# Patient Record
Sex: Male | Born: 1975 | Race: White | Hispanic: No | Marital: Single | Smoking: Former smoker
Health system: Southern US, Community
[De-identification: ages and names within clinical notes are randomized; demographics above are authoritative.]

## PROBLEM LIST (undated history)

## (undated) DIAGNOSIS — I2510 Atherosclerotic heart disease of native coronary artery without angina pectoris: Secondary | ICD-10-CM

## (undated) DIAGNOSIS — Z8674 Personal history of sudden cardiac arrest: Secondary | ICD-10-CM

## (undated) DIAGNOSIS — Z87891 Personal history of nicotine dependence: Secondary | ICD-10-CM

## (undated) DIAGNOSIS — Z8679 Personal history of other diseases of the circulatory system: Secondary | ICD-10-CM

## (undated) DIAGNOSIS — I509 Heart failure, unspecified: Secondary | ICD-10-CM

## (undated) DIAGNOSIS — E785 Hyperlipidemia, unspecified: Secondary | ICD-10-CM

## (undated) DIAGNOSIS — I5020 Unspecified systolic (congestive) heart failure: Secondary | ICD-10-CM

## (undated) DIAGNOSIS — I252 Old myocardial infarction: Secondary | ICD-10-CM

## (undated) HISTORY — DX: Personal history of nicotine dependence: Z87.891

## (undated) HISTORY — DX: Unspecified systolic (congestive) heart failure: I50.20

## (undated) HISTORY — DX: Personal history of other diseases of the circulatory system: Z86.79

## (undated) HISTORY — PX: CARDIAC CATHETERIZATION: SHX172

## (undated) HISTORY — DX: Old myocardial infarction: I25.2

## (undated) HISTORY — DX: Hyperlipidemia, unspecified: E78.5

## (undated) HISTORY — DX: Heart failure, unspecified: I50.9

## (undated) HISTORY — DX: Atherosclerotic heart disease of native coronary artery without angina pectoris: I25.10

## (undated) HISTORY — DX: Personal history of sudden cardiac arrest: Z86.74

---

## 2016-12-03 DIAGNOSIS — Z720 Tobacco use: Secondary | ICD-10-CM

## 2016-12-03 DIAGNOSIS — N529 Male erectile dysfunction, unspecified: Secondary | ICD-10-CM | POA: Insufficient documentation

## 2016-12-03 HISTORY — DX: Tobacco use: Z72.0

## 2019-08-13 DIAGNOSIS — J302 Other seasonal allergic rhinitis: Secondary | ICD-10-CM | POA: Insufficient documentation

## 2020-09-07 ENCOUNTER — Encounter
Admission: EM | Disposition: A | Discharge status: Home / Self Care | Payer: Self-pay | Source: Home / Self Care | Attending: Internal Medicine

## 2020-09-07 ENCOUNTER — Emergency Department
Admission: EM | Admit: 2020-09-07 | Discharge: 2020-09-07 | Discharge status: Absent without leave | Payer: No Typology Code available for payment source | Source: Home / Self Care

## 2020-09-07 ENCOUNTER — Inpatient Hospital Stay
Admission: EM | Admit: 2020-09-07 | Discharge: 2020-09-13 | DRG: 246 | Disposition: A | Discharge status: Home / Self Care | Payer: No Typology Code available for payment source | Attending: Internal Medicine | Admitting: Internal Medicine

## 2020-09-07 DIAGNOSIS — Z4659 Encounter for fitting and adjustment of other gastrointestinal appliance and device: Secondary | ICD-10-CM

## 2020-09-07 DIAGNOSIS — Z978 Presence of other specified devices: Secondary | ICD-10-CM

## 2020-09-07 DIAGNOSIS — I5021 Acute systolic (congestive) heart failure: Secondary | ICD-10-CM

## 2020-09-07 DIAGNOSIS — I469 Cardiac arrest, cause unspecified: Secondary | ICD-10-CM | POA: Diagnosis present

## 2020-09-07 DIAGNOSIS — I2109 ST elevation (STEMI) myocardial infarction involving other coronary artery of anterior wall: Secondary | ICD-10-CM | POA: Diagnosis present

## 2020-09-07 DIAGNOSIS — D649 Anemia, unspecified: Secondary | ICD-10-CM | POA: Diagnosis present

## 2020-09-07 DIAGNOSIS — R112 Nausea with vomiting, unspecified: Secondary | ICD-10-CM | POA: Diagnosis not present

## 2020-09-07 DIAGNOSIS — E785 Hyperlipidemia, unspecified: Secondary | ICD-10-CM | POA: Diagnosis present

## 2020-09-07 DIAGNOSIS — I462 Cardiac arrest due to underlying cardiac condition: Secondary | ICD-10-CM | POA: Diagnosis present

## 2020-09-07 DIAGNOSIS — J9600 Acute respiratory failure, unspecified whether with hypoxia or hypercapnia: Secondary | ICD-10-CM

## 2020-09-07 DIAGNOSIS — Z79899 Other long term (current) drug therapy: Secondary | ICD-10-CM

## 2020-09-07 DIAGNOSIS — I2510 Atherosclerotic heart disease of native coronary artery without angina pectoris: Secondary | ICD-10-CM

## 2020-09-07 DIAGNOSIS — E7800 Pure hypercholesterolemia, unspecified: Secondary | ICD-10-CM | POA: Diagnosis not present

## 2020-09-07 DIAGNOSIS — I2102 ST elevation (STEMI) myocardial infarction involving left anterior descending coronary artery: Secondary | ICD-10-CM

## 2020-09-07 DIAGNOSIS — Z20822 Contact with and (suspected) exposure to covid-19: Secondary | ICD-10-CM | POA: Diagnosis present

## 2020-09-07 DIAGNOSIS — F17200 Nicotine dependence, unspecified, uncomplicated: Secondary | ICD-10-CM | POA: Diagnosis present

## 2020-09-07 DIAGNOSIS — R570 Cardiogenic shock: Secondary | ICD-10-CM | POA: Diagnosis present

## 2020-09-07 DIAGNOSIS — Z720 Tobacco use: Secondary | ICD-10-CM | POA: Diagnosis not present

## 2020-09-07 DIAGNOSIS — I4901 Ventricular fibrillation: Secondary | ICD-10-CM | POA: Diagnosis present

## 2020-09-07 DIAGNOSIS — I5023 Acute on chronic systolic (congestive) heart failure: Secondary | ICD-10-CM | POA: Diagnosis present

## 2020-09-07 DIAGNOSIS — J690 Pneumonitis due to inhalation of food and vomit: Secondary | ICD-10-CM | POA: Diagnosis present

## 2020-09-07 DIAGNOSIS — Z452 Encounter for adjustment and management of vascular access device: Secondary | ICD-10-CM

## 2020-09-07 DIAGNOSIS — E876 Hypokalemia: Secondary | ICD-10-CM | POA: Diagnosis not present

## 2020-09-07 DIAGNOSIS — E875 Hyperkalemia: Secondary | ICD-10-CM | POA: Diagnosis present

## 2020-09-07 DIAGNOSIS — J9602 Acute respiratory failure with hypercapnia: Secondary | ICD-10-CM | POA: Diagnosis present

## 2020-09-07 DIAGNOSIS — I213 ST elevation (STEMI) myocardial infarction of unspecified site: Secondary | ICD-10-CM

## 2020-09-07 DIAGNOSIS — J9601 Acute respiratory failure with hypoxia: Secondary | ICD-10-CM | POA: Diagnosis not present

## 2020-09-07 DIAGNOSIS — R059 Cough, unspecified: Secondary | ICD-10-CM

## 2020-09-07 DIAGNOSIS — I272 Pulmonary hypertension, unspecified: Secondary | ICD-10-CM | POA: Diagnosis present

## 2020-09-07 DIAGNOSIS — N179 Acute kidney failure, unspecified: Secondary | ICD-10-CM

## 2020-09-07 DIAGNOSIS — I255 Ischemic cardiomyopathy: Secondary | ICD-10-CM | POA: Diagnosis present

## 2020-09-07 DIAGNOSIS — J9621 Acute and chronic respiratory failure with hypoxia: Secondary | ICD-10-CM | POA: Diagnosis not present

## 2020-09-07 DIAGNOSIS — N170 Acute kidney failure with tubular necrosis: Secondary | ICD-10-CM | POA: Diagnosis present

## 2020-09-07 HISTORY — PX: CORONARY/GRAFT ACUTE MI REVASCULARIZATION: CATH118305

## 2020-09-07 HISTORY — PX: RIGHT/LEFT HEART CATH AND CORONARY ANGIOGRAPHY: CATH118266

## 2020-09-07 LAB — COMPREHENSIVE METABOLIC PANEL
ALT: 130 U/L — ABNORMAL HIGH (ref 0–44)
AST: 150 U/L — ABNORMAL HIGH (ref 15–41)
Albumin: 3.7 g/dL (ref 3.5–5.0)
Alkaline Phosphatase: 90 U/L (ref 38–126)
Anion gap: 18 — ABNORMAL HIGH (ref 5–15)
BUN: 10 mg/dL (ref 6–20)
CO2: 15 mmol/L — ABNORMAL LOW (ref 22–32)
Calcium: 9 mg/dL (ref 8.9–10.3)
Chloride: 104 mmol/L (ref 98–111)
Creatinine, Ser: 1.43 mg/dL — ABNORMAL HIGH (ref 0.61–1.24)
GFR, Estimated: >60 mL/min (ref >60)
Glucose, Bld: 340 mg/dL — ABNORMAL HIGH (ref 70–99)
Potassium: 2.8 mmol/L — ABNORMAL LOW (ref 3.5–5.1)
Sodium: 137 mmol/L (ref 135–145)
Total Bilirubin: 0.8 mg/dL (ref 0.3–1.2)
Total Protein: 6.8 g/dL (ref 6.5–8.1)

## 2020-09-07 LAB — CBC WITH DIFFERENTIAL/PLATELET
Abs Immature Granulocytes: 0.65 10*3/uL — ABNORMAL HIGH (ref 0.00–0.07)
Basophils Absolute: 0.1 10*3/uL (ref 0.0–0.1)
Basophils Relative: 1 %
Eosinophils Absolute: 0.2 10*3/uL (ref 0.0–0.5)
Eosinophils Relative: 1 %
HCT: 41.3 % (ref 39.0–52.0)
Hemoglobin: 13.3 g/dL (ref 13.0–17.0)
Immature Granulocytes: 4 %
Lymphocytes Relative: 38 %
Lymphs Abs: 6.8 10*3/uL — ABNORMAL HIGH (ref 0.7–4.0)
MCH: 31 pg (ref 26.0–34.0)
MCHC: 32.2 g/dL (ref 30.0–36.0)
MCV: 96.3 fL (ref 80.0–100.0)
Monocytes Absolute: 1.1 10*3/uL — ABNORMAL HIGH (ref 0.1–1.0)
Monocytes Relative: 6 %
Neutro Abs: 8.9 10*3/uL — ABNORMAL HIGH (ref 1.7–7.7)
Neutrophils Relative %: 50 %
Platelets: 288 10*3/uL (ref 150–400)
RBC: 4.29 MIL/uL (ref 4.22–5.81)
RDW: 12.3 % (ref 11.5–15.5)
WBC: 17.8 10*3/uL — ABNORMAL HIGH (ref 4.0–10.5)
nRBC: 0 % (ref 0.0–0.2)

## 2020-09-07 LAB — GLUCOSE, CAPILLARY: Glucose-Capillary: 140 mg/dL — ABNORMAL HIGH (ref 70–99)

## 2020-09-07 LAB — BLOOD GAS, ARTERIAL
Acid-base deficit: 8.4 mmol/L — ABNORMAL HIGH (ref 0.0–2.0)
Bicarbonate: 19.6 mmol/L — ABNORMAL LOW (ref 20.0–28.0)
FIO2: 100
MECHVT: 500 mL
O2 Saturation: 98.4 %
PEEP: 5 cmH2O
Patient temperature: 37
RATE: 18 resp/min
pCO2 arterial: 49 mmHg — ABNORMAL HIGH (ref 32.0–48.0)
pH, Arterial: 7.21 — ABNORMAL LOW (ref 7.350–7.450)
pO2, Arterial: 133 mmHg — ABNORMAL HIGH (ref 83.0–108.0)

## 2020-09-07 LAB — POCT ACTIVATED CLOTTING TIME
Activated Clotting Time: 225 seconds
Activated Clotting Time: 225 seconds

## 2020-09-07 LAB — TROPONIN I (HIGH SENSITIVITY)
Troponin I (High Sensitivity): 3635 ng/L (ref <18)
Troponin I (High Sensitivity): >27000 ng/L (ref <18)

## 2020-09-07 LAB — MAGNESIUM: Magnesium: 2.2 mg/dL (ref 1.7–2.4)

## 2020-09-07 LAB — MRSA PCR SCREENING: MRSA by PCR: NEGATIVE

## 2020-09-07 LAB — RESP PANEL BY RT-PCR (FLU A&B, COVID) ARPGX2
Influenza A by PCR: NEGATIVE
Influenza B by PCR: NEGATIVE
SARS Coronavirus 2 by RT PCR: NEGATIVE

## 2020-09-07 LAB — HIV ANTIBODY (ROUTINE TESTING W REFLEX): HIV Screen 4th Generation wRfx: NONREACTIVE

## 2020-09-07 SURGERY — CORONARY/GRAFT ACUTE MI REVASCULARIZATION
Anesthesia: Moderate Sedation

## 2020-09-07 MED ORDER — HEPARIN (PORCINE) IN NACL 1000-0.9 UT/500ML-% IV SOLN
INTRAVENOUS | Status: AC
  Filled 2020-09-07: qty 1000

## 2020-09-07 MED ORDER — ATORVASTATIN CALCIUM 20 MG PO TABS
80.0000 mg | ORAL_TABLET | Freq: Every day | ORAL | Status: DC
  Administered 2020-09-07 – 2020-09-09 (×3): 80 mg
  Filled 2020-09-07 (×2): qty 4

## 2020-09-07 MED ORDER — ATORVASTATIN CALCIUM 20 MG PO TABS
80.0000 mg | ORAL_TABLET | Freq: Every day | ORAL | Status: DC
  Filled 2020-09-07: qty 4

## 2020-09-07 MED ORDER — ETOMIDATE 2 MG/ML IV SOLN
INTRAVENOUS | Status: DC | PRN
  Administered 2020-09-07: 20 mg via INTRAVENOUS

## 2020-09-07 MED ORDER — ASPIRIN 81 MG PO CHEW
81.0000 mg | CHEWABLE_TABLET | Freq: Every day | ORAL | Status: DC
  Administered 2020-09-08 – 2020-09-10 (×3): 81 mg
  Filled 2020-09-07 (×3): qty 1

## 2020-09-07 MED ORDER — POTASSIUM CHLORIDE 20 MEQ PO PACK
40.0000 meq | PACK | Freq: Once | ORAL | Status: AC
  Administered 2020-09-07: 40 meq
  Filled 2020-09-07: qty 2

## 2020-09-07 MED ORDER — TIROFIBAN HCL IV 12.5 MG/250 ML
0.1500 ug/kg/min | INTRAVENOUS | Status: AC
  Administered 2020-09-07: 0.15 ug/kg/min via INTRAVENOUS
  Filled 2020-09-07: qty 250

## 2020-09-07 MED ORDER — SODIUM CHLORIDE 0.9 % IV SOLN
250.0000 mL | INTRAVENOUS | Status: DC
  Administered 2020-09-07: 250 mL via INTRAVENOUS

## 2020-09-07 MED ORDER — DOCUSATE SODIUM 50 MG/5ML PO LIQD: 100.0000 mg | Freq: Two times a day (BID) | ORAL | Status: DC | PRN

## 2020-09-07 MED ORDER — ASPIRIN 81 MG PO CHEW: 81.0000 mg | CHEWABLE_TABLET | Freq: Every day | ORAL | Status: DC

## 2020-09-07 MED ORDER — SODIUM CHLORIDE 0.9% FLUSH
3.0000 mL | Freq: Two times a day (BID) | INTRAVENOUS | Status: DC
  Administered 2020-09-07 – 2020-09-12 (×11): 3 mL via INTRAVENOUS

## 2020-09-07 MED ORDER — MIDAZOLAM HCL 2 MG/2ML IJ SOLN
INTRAMUSCULAR | Status: AC
  Filled 2020-09-07: qty 2

## 2020-09-07 MED ORDER — ASPIRIN 300 MG RE SUPP: 300.0000 mg | Freq: Once | RECTAL | Status: DC

## 2020-09-07 MED ORDER — NOREPINEPHRINE 4 MG/250ML-% IV SOLN: 0.0000 ug/min | INTRAVENOUS | Status: DC

## 2020-09-07 MED ORDER — NOREPINEPHRINE BITARTRATE 1 MG/ML IV SOLN
INTRAVENOUS | Status: AC
  Filled 2020-09-07: qty 4

## 2020-09-07 MED ORDER — ACETAMINOPHEN 325 MG PO TABS
650.0000 mg | ORAL_TABLET | ORAL | Status: DC | PRN
Start: 2020-09-07 — End: 2020-09-07

## 2020-09-07 MED ORDER — MIDAZOLAM HCL 5 MG/5ML IJ SOLN
INTRAMUSCULAR | Status: DC | PRN
  Administered 2020-09-07: 4 mg via INTRAVENOUS

## 2020-09-07 MED ORDER — SODIUM CHLORIDE 0.9 % IV SOLN
250.0000 mL | INTRAVENOUS | Status: DC | PRN
Start: 2020-09-07 — End: 2020-09-13
  Administered 2020-09-11: 250 mL via INTRAVENOUS

## 2020-09-07 MED ORDER — HEPARIN (PORCINE) IN NACL 1000-0.9 UT/500ML-% IV SOLN
INTRAVENOUS | Status: DC | PRN
  Administered 2020-09-07: 1000 mL

## 2020-09-07 MED ORDER — DOCUSATE SODIUM 100 MG PO CAPS
100.0000 mg | ORAL_CAPSULE | Freq: Two times a day (BID) | ORAL | Status: DC | PRN
  Filled 2020-09-07: qty 1

## 2020-09-07 MED ORDER — VERAPAMIL HCL 2.5 MG/ML IV SOLN
INTRAVENOUS | Status: DC | PRN
  Administered 2020-09-07: 2.5 mg via INTRA_ARTERIAL

## 2020-09-07 MED ORDER — TIROFIBAN HCL IN NACL 5-0.9 MG/100ML-% IV SOLN
INTRAVENOUS | Status: AC
  Filled 2020-09-07: qty 100

## 2020-09-07 MED ORDER — CHLORHEXIDINE GLUCONATE CLOTH 2 % EX PADS
6.0000 | MEDICATED_PAD | Freq: Every day | CUTANEOUS | Status: DC
  Administered 2020-09-07 – 2020-09-12 (×5): 6 via TOPICAL

## 2020-09-07 MED ORDER — TIROFIBAN (AGGRASTAT) BOLUS VIA INFUSION
INTRAVENOUS | Status: DC | PRN
  Administered 2020-09-07: 2000 ug via INTRAVENOUS

## 2020-09-07 MED ORDER — PROPOFOL 1000 MG/100ML IV EMUL
5.0000 ug/kg/min | INTRAVENOUS | Status: DC
  Administered 2020-09-07: 5 ug/kg/min via INTRAVENOUS

## 2020-09-07 MED ORDER — TICAGRELOR 90 MG PO TABS: 90.0000 mg | ORAL_TABLET | Freq: Two times a day (BID) | ORAL | Status: DC

## 2020-09-07 MED ORDER — TIROFIBAN HCL IN NACL 5-0.9 MG/100ML-% IV SOLN
INTRAVENOUS | Status: AC | PRN
  Administered 2020-09-07: 0.15 ug/kg/min via INTRAVENOUS

## 2020-09-07 MED ORDER — FENTANYL CITRATE (PF) 100 MCG/2ML IJ SOLN
INTRAMUSCULAR | Status: AC
  Filled 2020-09-07: qty 2

## 2020-09-07 MED ORDER — SUCCINYLCHOLINE CHLORIDE 20 MG/ML IJ SOLN
INTRAMUSCULAR | Status: DC | PRN
  Administered 2020-09-07: 100 mg via INTRAVENOUS

## 2020-09-07 MED ORDER — ACETAMINOPHEN 325 MG PO TABS
650.0000 mg | ORAL_TABLET | ORAL | Status: DC | PRN
  Administered 2020-09-08: 650 mg
  Filled 2020-09-07: qty 2

## 2020-09-07 MED ORDER — CHLORHEXIDINE GLUCONATE 0.12% ORAL RINSE (MEDLINE KIT)
15.0000 mL | Freq: Two times a day (BID) | OROMUCOSAL | Status: DC
  Administered 2020-09-07 – 2020-09-12 (×9): 15 mL via OROMUCOSAL

## 2020-09-07 MED ORDER — ONDANSETRON HCL 4 MG/2ML IJ SOLN
4.0000 mg | Freq: Four times a day (QID) | INTRAMUSCULAR | Status: DC | PRN
  Administered 2020-09-10: 4 mg via INTRAVENOUS
  Filled 2020-09-07: qty 2

## 2020-09-07 MED ORDER — ONDANSETRON HCL 4 MG/2ML IJ SOLN
INTRAMUSCULAR | Status: AC
  Filled 2020-09-07: qty 2

## 2020-09-07 MED ORDER — ASPIRIN 300 MG RE SUPP
300.0000 mg | Freq: Once | RECTAL | Status: AC
  Administered 2020-09-07: 300 mg via RECTAL

## 2020-09-07 MED ORDER — ORAL CARE MOUTH RINSE
15.0000 mL | OROMUCOSAL | Status: DC
  Administered 2020-09-07 – 2020-09-10 (×26): 15 mL via OROMUCOSAL

## 2020-09-07 MED ORDER — TICAGRELOR 90 MG PO TABS
ORAL_TABLET | ORAL | Status: AC
  Filled 2020-09-07: qty 2

## 2020-09-07 MED ORDER — SODIUM BICARBONATE 8.4 % IV SOLN
INTRAVENOUS | Status: DC | PRN
  Administered 2020-09-07: 25 meq via INTRAVENOUS

## 2020-09-07 MED ORDER — PROPOFOL 1000 MG/100ML IV EMUL
INTRAVENOUS | Status: AC
  Filled 2020-09-07: qty 100

## 2020-09-07 MED ORDER — NOREPINEPHRINE 4 MG/250ML-% IV SOLN
2.0000 ug/min | INTRAVENOUS | Status: DC
  Administered 2020-09-07: 2 ug/min via INTRAVENOUS
  Administered 2020-09-08 (×2): 10 ug/min via INTRAVENOUS
  Filled 2020-09-07 (×3): qty 250

## 2020-09-07 MED ORDER — HEPARIN SODIUM (PORCINE) 1000 UNIT/ML IJ SOLN
INTRAMUSCULAR | Status: DC | PRN
  Administered 2020-09-07: 3000 [IU] via INTRAVENOUS
  Administered 2020-09-07: 5000 [IU] via INTRAVENOUS

## 2020-09-07 MED ORDER — SODIUM CHLORIDE 0.9 % IV SOLN: 250.0000 mL | INTRAVENOUS | Status: DC | PRN

## 2020-09-07 MED ORDER — IOHEXOL 300 MG/ML  SOLN
INTRAMUSCULAR | Status: DC | PRN
  Administered 2020-09-07: 137 mL

## 2020-09-07 MED ORDER — TICAGRELOR 90 MG PO TABS
ORAL_TABLET | ORAL | Status: DC | PRN
  Administered 2020-09-07: 180 mg via ORAL

## 2020-09-07 MED ORDER — FAMOTIDINE IN NACL 20-0.9 MG/50ML-% IV SOLN
20.0000 mg | INTRAVENOUS | Status: DC
  Administered 2020-09-07 – 2020-09-10 (×4): 20 mg via INTRAVENOUS
  Filled 2020-09-07 (×4): qty 50

## 2020-09-07 MED ORDER — HEPARIN SODIUM (PORCINE) 1000 UNIT/ML IJ SOLN
INTRAMUSCULAR | Status: AC
  Filled 2020-09-07: qty 1

## 2020-09-07 MED ORDER — VERAPAMIL HCL 2.5 MG/ML IV SOLN
INTRAVENOUS | Status: AC
  Filled 2020-09-07: qty 2

## 2020-09-07 MED ORDER — MIDAZOLAM 50MG/50ML (1MG/ML) PREMIX INFUSION
0.5000 mg/h | INTRAVENOUS | Status: DC
  Filled 2020-09-07: qty 50

## 2020-09-07 MED ORDER — ENOXAPARIN SODIUM 40 MG/0.4ML ~~LOC~~ SOLN: 40.0000 mg | SUBCUTANEOUS | Status: DC

## 2020-09-07 MED ORDER — MIDAZOLAM HCL 2 MG/2ML IJ SOLN
INTRAMUSCULAR | Status: DC | PRN
  Administered 2020-09-07: 1 mg via INTRAVENOUS

## 2020-09-07 MED ORDER — POLYETHYLENE GLYCOL 3350 17 G PO PACK
17.0000 g | PACK | Freq: Every day | ORAL | Status: DC | PRN
  Filled 2020-09-07: qty 1

## 2020-09-07 MED ORDER — PROPOFOL 1000 MG/100ML IV EMUL
5.0000 ug/kg/min | INTRAVENOUS | Status: DC
  Administered 2020-09-07: 20 ug/kg/min via INTRAVENOUS
  Administered 2020-09-07 – 2020-09-08 (×4): 30 ug/kg/min via INTRAVENOUS
  Administered 2020-09-09 (×4): 40 ug/kg/min via INTRAVENOUS
  Administered 2020-09-10 (×2): 30 ug/kg/min via INTRAVENOUS
  Filled 2020-09-07 (×10): qty 100

## 2020-09-07 MED ORDER — FENTANYL 2500MCG IN NS 250ML (10MCG/ML) PREMIX INFUSION
0.0000 ug/h | INTRAVENOUS | Status: DC
  Administered 2020-09-07: 100 ug/h via INTRAVENOUS
  Administered 2020-09-08 (×2): 200 ug/h via INTRAVENOUS
  Administered 2020-09-09: 75 ug/h via INTRAVENOUS
  Filled 2020-09-07 (×4): qty 250

## 2020-09-07 MED ORDER — SODIUM CHLORIDE 0.9% FLUSH: 3.0000 mL | INTRAVENOUS | Status: DC | PRN

## 2020-09-07 MED ORDER — LIDOCAINE HCL (PF) 1 % IJ SOLN
INTRAMUSCULAR | Status: DC | PRN
  Administered 2020-09-07: 22 mL

## 2020-09-07 MED ORDER — FENTANYL CITRATE (PF) 100 MCG/2ML IJ SOLN
INTRAMUSCULAR | Status: DC | PRN
  Administered 2020-09-07: 50 ug via INTRAVENOUS

## 2020-09-07 MED ORDER — TICAGRELOR 90 MG PO TABS
90.0000 mg | ORAL_TABLET | Freq: Two times a day (BID) | ORAL | Status: DC
  Administered 2020-09-08 – 2020-09-10 (×5): 90 mg
  Filled 2020-09-07 (×5): qty 1

## 2020-09-07 MED ORDER — SODIUM BICARBONATE 8.4 % IV SOLN
INTRAVENOUS | Status: AC
  Filled 2020-09-07: qty 50

## 2020-09-07 MED ORDER — SODIUM CHLORIDE 0.9% FLUSH
3.0000 mL | INTRAVENOUS | Status: DC | PRN
Start: 2020-09-07 — End: 2020-09-13

## 2020-09-07 MED ORDER — POTASSIUM CHLORIDE 10 MEQ/100ML IV SOLN
10.0000 meq | INTRAVENOUS | Status: AC
Start: 2020-09-07 — End: 2020-09-08
  Administered 2020-09-07 – 2020-09-08 (×6): 10 meq via INTRAVENOUS
  Filled 2020-09-07 (×6): qty 100

## 2020-09-07 MED ORDER — POLYETHYLENE GLYCOL 3350 17 G PO PACK: 17.0000 g | PACK | Freq: Every day | ORAL | Status: DC | PRN

## 2020-09-07 MED ORDER — FAMOTIDINE IN NACL 20-0.9 MG/50ML-% IV SOLN: 20.0000 mg | Freq: Two times a day (BID) | INTRAVENOUS | Status: DC

## 2020-09-07 MED ORDER — ONDANSETRON HCL 4 MG/2ML IJ SOLN
INTRAMUSCULAR | Status: DC | PRN
  Administered 2020-09-07: 4 mg via INTRAVENOUS

## 2020-09-07 SURGICAL SUPPLY — 23 items
BALLN TREK RX 2.5X12 (BALLOONS) ×2
BALLN ~~LOC~~ TREK RX 3.75X8 (BALLOONS) ×2
BALLOON TREK RX 2.5X12 (BALLOONS) ×1 IMPLANT
BALLOON ~~LOC~~ TREK RX 3.75X8 (BALLOONS) ×1 IMPLANT
CANNULA 5F STIFF (CANNULA) ×2 IMPLANT
CATH INFINITI 5FR JK (CATHETERS) ×2 IMPLANT
CATH LAUNCHER 6FR EBU3.5 (CATHETERS) ×2 IMPLANT
CATH SWAN GANZ 7F STRAIGHT (CATHETERS) ×2 IMPLANT
COVER EZ STRL 42X30 (DRAPES) ×2 IMPLANT
DEVICE CLOSURE MYNXGRIP 6/7F (Vascular Products) ×2 IMPLANT
DEVICE RAD TR BAND REGULAR (VASCULAR PRODUCTS) ×2 IMPLANT
DRAPE BRACHIAL (DRAPES) ×2 IMPLANT
GLIDESHEATH SLEND SS 6F .021 (SHEATH) ×2 IMPLANT
GUIDEWIRE INQWIRE 1.5J.035X260 (WIRE) ×1 IMPLANT
INQWIRE 1.5J .035X260CM (WIRE) ×2
KIT ENCORE 26 ADVANTAGE (KITS) ×2 IMPLANT
PACK CARDIAC CATH (CUSTOM PROCEDURE TRAY) ×2 IMPLANT
PROTECTION STATION PRESSURIZED (MISCELLANEOUS) ×2
SET ATX SIMPLICITY (MISCELLANEOUS) ×2 IMPLANT
SHEATH AVANTI 7FRX11 (SHEATH) ×2 IMPLANT
STATION PROTECTION PRESSURIZED (MISCELLANEOUS) ×1 IMPLANT
STENT RESOLUTE ONYX 3.5X12 (Permanent Stent) ×2 IMPLANT
WIRE RUNTHROUGH .014X180CM (WIRE) ×2 IMPLANT

## 2020-09-07 NOTE — Sedation Documentation (Addendum)
Pt transported to cath lab.  

## 2020-09-07 NOTE — Consult Note (Signed)
Cardiology Consultation:   Patient ID: Adam Carter MRN: [id number]; DOB: [birth year]  Admit date: 09/07/2020 Date of Consult: 09/07/2020  PCP:  No primary care provider on file.   [facility]  Cardiologist:  Adam Carter) Advanced Practice Provider:  No care team member to display Electrophysiologist:  None     Patient Profile:   Adam Carter is a 45 y.o. male with a hx of tobacco use who is being seen today for the evaluation of out of hospital cardiac arrest and anterior ST elevation myocardial infarction at the request of Dr. Marisa Severin.  History of Present Illness:   Adam Carter is a 45 year old male with no prior cardiac history.  History was later obtained from the family who reported prolonged history of tobacco use but no chronic medical conditions.  They report he has been dealing with cervical spine issues and had an injection done about a week ago.  His girlfriend reported intermittent chest pain over the weekend but he did not seek medical attention.  He went to work today and reported some mild chest discomfort initially but then after lunch he reported severe chest pain followed by collapse.  Coworkers witnessed to collapse and did CPR.  When EMS arrived, he was in ventricular fibrillation.  He received 2 shocks and a single dose of epinephrine and had ROSC.  Post arrest EKG showed evidence of anterior ST elevation myocardial infarction.  STEMI was activated from the field.  The patient was brought to the ED and his airway was secured with an ET tube.  He had an IV placed and was given 4000 units of unfractionated heparin.  I recommended proceeding with emergent cardiac catheterization.   No past medical history on file.     Home Medications:  Prior to Admission medications   Not on File    Inpatient Medications: Scheduled Meds: . sodium chloride flush  3 mL Intravenous Q12H   Continuous Infusions: . sodium chloride    . famotidine (PEPCID) IV     . fentaNYL infusion INTRAVENOUS 100 mcg/hr (09/07/20 1643)  . midazolam    . propofol (DIPRIVAN) infusion     PRN Meds: sodium chloride, acetaminophen, docusate sodium, etomidate, midazolam, ondansetron (ZOFRAN) IV, polyethylene glycol, sodium chloride flush, succinylcholine  Allergies:   Not on File  Social History:   Social History   Socioeconomic History  . Marital status: Not on file    Spouse name: Not on file  . Number of children: Not on file  . Years of education: Not on file  . Highest education level: Not on file  Occupational History  . Not on file  Tobacco Use  . Smoking status: Not on file  . Smokeless tobacco: Not on file  Substance and Sexual Activity  . Alcohol use: Not on file  . Drug use: Not on file  . Sexual activity: Not on file  Other Topics Concern  . Not on file  Social History Narrative  . Not on file   Social Determinants of Health   Financial Resource Strain: Not on file  Food Insecurity: Not on file  Transportation Needs: Not on file  Physical Activity: Not on file  Stress: Not on file  Social Connections: Not on file  Intimate Partner Violence: Not on file    Family History:   Could not obtain family history from the patient was intubated and sedated  ROS:  Not able to perform given that the patient is intubated and sedated  Physical Exam/Data:   Vitals:   09/07/20 1545 09/07/20 1546 09/07/20 1621 09/07/20 1804  BP:  (!) 123/97    Pulse:  (!) 108    Resp:  20    SpO2: 100% 100%  100%  Weight:   80 kg   Height:   5\' 7"  (1.702 m)    No intake or output data in the 24 hours ending 09/07/20 1820 Last 3 Weights 09/07/2020  Weight (lbs) 176 lb 5.9 oz  Weight (kg) 80 kg     Body mass index is 27.62 kg/m.  General: Intubated and sedated with vomit in his mouth. HEENT: normal Lymph: no adenopathy Neck: no JVD Endocrine:  No thryomegaly Vascular: No carotid bruits; FA pulses 2+ bilaterally without bruits  Cardiac:  normal S1,  S2; RRR; no murmur  Lungs:  clear to auscultation bilaterally, no wheezing, rhonchi or rales  Abd: soft, nontender, no hepatomegaly  Ext: no edema Musculoskeletal:  No deformities Skin: EMS reported that he was blue initially but his color improved in the ED. Neuro: Nonfocal. Psych: Not able to evaluate.  EKG:  The EKG was personally reviewed and demonstrates: Sinus tachycardia with anterior ST elevation myocardial infarction.   Relevant CV Studies:   Laboratory Data:  High Sensitivity Troponin:   Recent Labs  Lab 09/07/20 1541  TROPONINIHS 3,635*     Chemistry Recent Labs  Lab 09/07/20 1541  NA 137  K 2.8*  CL 104  CO2 15*  GLUCOSE 340*  BUN 10  CREATININE 1.43*  CALCIUM 9.0  GFRNONAA >60  ANIONGAP 18*    Recent Labs  Lab 09/07/20 1541  PROT 6.8  ALBUMIN 3.7  AST 150*  ALT 130*  ALKPHOS 90  BILITOT 0.8   Hematology Recent Labs  Lab 09/07/20 1541  WBC 17.8*  RBC 4.29  HGB 13.3  HCT 41.3  MCV 96.3  MCH 31.0  MCHC 32.2  RDW 12.3  PLT 288   BNPNo results for input(s): BNP, PROBNP in the last 168 hours.  DDimer No results for input(s): DDIMER in the last 168 hours.   Radiology/Studies:  CARDIAC CATHETERIZATION  Result Date: 09/07/2020  The left ventricular ejection fraction is 25-35% by visual estimate.  There is moderate to severe left ventricular systolic dysfunction.  LV end diastolic pressure is moderately elevated.  Ost LAD to Prox LAD lesion is 100% stenosed.  Post intervention, there is a 0% residual stenosis.  A drug-eluting stent was successfully placed using a STENT RESOLUTE ONYX 3.5X12.  Mid LAD lesion is 30% stenosed.  1.  Out of hospital cardiac arrest with anterior ST elevation myocardial infarction due to ostial occlusion of the LAD.  No other obstructive disease. 2.  Moderately severely reduced LV systolic function with an EF of 25 to 30% with severe anterior/apical hypokinesis. 3.  Successful angioplasty and drug-eluting stent  placement to the ostial LAD. 4.  Right heart catheterization was performed after PCI and showed mildly elevated filling pressures, mild pulmonary hypertension and normal cardiac output.  PA sat was 78.7%.  Fick cardiac output was 6.17 with a cardiac index of 3.5.  CPO was 1.4.  Based on these hemodynamic numbers, no hemodynamic support was utilized. Recommendations: Dual antiplatelet therapy for at least 1 year. High-dose atorvastatin. We will run Aggrastat infusion for 12 hours given uncertainty of oral absorption of antiplatelet medications. The patient had significant agitation and this will need to be managed.  He is currently on propofol drip and fentanyl drip.  I discussed  with Dr. Belia Heman. Recommend electrolyte replacement. Critical care time of 45 minutes managing abnormal hemodynamics, ventilator management and electrolytes.     Assessment and Plan:   1. Out of hospital cardiac arrest due to thrombotic occlusion of ostial LAD: Emergent cardiac catheterization was done via the right radial artery which showed thrombotic occlusion of the ostial LAD with no other obstructive disease.  I performed successful angioplasty and drug-eluting stent placement to the LAD.  Recommend dual antiplatelet therapy for at least 12 months.  Given uncertainty about GI absorption of his ticagrelor, I am going to run Aggrastat infusion for 12 hours.  No beta-blockers or ACE inhibitors yet due to soft blood pressure.  Right heart catheterization was not consistent with cardiogenic shock and thus mechanical support device was not utilized. 2. Acute systolic heart failure due to ischemic cardiomyopathy: EF is 25 to 30% by left ventricular angiography.  We will obtain an echocardiogram.  Monitor hemodynamics closely.  Once blood pressure tolerates, the patient will be started on carvedilol and an ACE inhibitor/ARB/Arni.  If blood pressure tolerates, spironolactone will be added later.  If the patient makes meaningful neurologic  recovery, he might require a wearable defibrillator depending on what his ejection fraction is by echo. 3. Acute respiratory failure due to the above.  Monitor volume status carefully.  His wedge pressure was only mildly elevated at 18 mmHg.  No need for diuresis as of yet but will have to monitor.  I discussed with Dr. Belia Heman.  The patient was agitated biting on the tube.  He was placed on propofol drip and this will have to be managed. 4. Electrolyte abnormalities, I requested pharmacy consultation to replace potassium. 5. Tobacco use: We will counsel about smoking cessation when appropriate. 6. Hyperlipidemia: I started high-dose atorvastatin.   Risk Assessment/Risk Scores:   TIMI Risk Score for ST  Elevation MI:   The patient's TIMI risk score is 5, which indicates a 12.4% risk of all cause mortality at 30 days.      For questions or updates, please contact [facility] Please consult [email or web address] for contact info under    Signed, Lorine Bears, MD  09/07/2020 6:20 PM

## 2020-09-07 NOTE — Code Documentation (Signed)
Dr. Kirke Corin, MD at bedside.

## 2020-09-07 NOTE — ED Provider Notes (Signed)
[facility] Emergency Department Provider Note ____________________________________________   Event Date/Time   First MD Initiated Contact with Patient 09/07/20 1552     (approximate)  I have reviewed the triage vital signs and the nursing notes.   HISTORY  Chief Complaint No chief complaint on file.  Level 5 caveat: History of present illness limited due to unresponsiveness  HPI Adam Carter is a 45 y.o. male with unknown PMH who presents after cardiac arrest.  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  He then said "oh shit" and his coworkers witnessed him collapse.  He was found to not have a pulse and CPR was initiated by bystanders.  When EMS arrived, the patient was found to be in ventricular fibrillation.  He received 2 shocks and a single dose of epinephrine and had ROSC.  EKG postarrest showed evidence of STEMI.  The patient is unable to give any history.  No past medical history on file.  There are no problems to display for this patient.    Prior to Admission medications   Not on File    Allergies Patient has no allergy information on record.  No family history on file.  Social History    Review of Systems Level 5 caveat: Unable to obtain review of systems due to unresponsiveness  ____________________________________________   PHYSICAL EXAM:  VITAL SIGNS: ED Triage Vitals [09/07/20 1546]  Enc Vitals Group     BP (!) 123/97     Pulse Rate (!) 108     Resp 20     Temp      Temp src      SpO2 100 %     Weight      Height      Head Circumference      Peak Flow      Pain Score      Pain Loc      Pain Edu?      Excl. in GC?     Constitutional: Unresponsive. Eyes: Conjunctivae are normal.  Head: Atraumatic. Nose: No congestion/rhinnorhea. Mouth/Throat: Mucous membranes are moist.  Vomitus present in oropharynx. Neck: Normal range of motion.  Cardiovascular: Tachycardic, regular rhythm. Grossly  normal heart sounds.  Good peripheral circulation. Respiratory: Lungs CTAB. Gastrointestinal: Soft and nontender. No distention.  Genitourinary: Normal external genitalia. Musculoskeletal: No lower extremity edema.  Extremities warm and well perfused.  Neurologic: Unable to assess; unresponsive Skin:  Skin is warm and dry. No rash noted. Psychiatric: Unable to assess.  ____________________________________________   LABS (all labs ordered are listed, but only abnormal results are displayed)  Labs Reviewed  CBC WITH DIFFERENTIAL/PLATELET - Abnormal; Notable for the following components:      Result Value   WBC 17.8 (*)    Neutro Abs 8.9 (*)    Lymphs Abs 6.8 (*)    Monocytes Absolute 1.1 (*)    Abs Immature Granulocytes 0.65 (*)    All other components within normal limits  BLOOD GAS, ARTERIAL - Abnormal; Notable for the following components:   pH, Arterial 7.21 (*)    pCO2 arterial 49 (*)    pO2, Arterial 133 (*)    Bicarbonate 19.6 (*)    Acid-base deficit 8.4 (*)    All other components within normal limits  RESP PANEL BY RT-PCR (FLU A&B, COVID) ARPGX2  COMPREHENSIVE METABOLIC PANEL  TROPONIN I (HIGH SENSITIVITY)   ____________________________________________  EKG   ____________________________________________  RADIOLOGY    ____________________________________________  PROCEDURES  Procedure(s) performed: Yes  Procedure Name: Intubation Date/Time: 09/07/2020 4:23 PM Performed by: Dionne Bucy, MD Pre-anesthesia Checklist: Patient identified, Emergency Drugs available, Suction available and Patient being monitored Preoxygenation: Pre-oxygenation with 100% oxygen Induction Type: IV induction and Rapid sequence Laryngoscope Size: Glidescope Tube size: 8.0 mm Number of attempts: 1 Placement Confirmation: ETT inserted through vocal cords under direct vision,  CO2 detector and Breath sounds checked- equal and bilateral Secured at: 26 cm Tube secured  with: ETT holder Dental Injury: Teeth and Oropharynx as per pre-operative assessment        Critical Care performed: Yes  CRITICAL CARE Performed by: Dionne Bucy   Total critical care time: 30 minutes  Critical care time was exclusive of separately billable procedures and treating other patients.  Critical care was necessary to treat or prevent imminent or life-threatening deterioration.  Critical care was time spent personally by me on the following activities: development of treatment plan with patient and/or surrogate as well as nursing, discussions with consultants, evaluation of patient's response to treatment, examination of patient, obtaining history from patient or surrogate, ordering and performing treatments and interventions, ordering and review of laboratory studies, ordering and review of radiographic studies, pulse oximetry and re-evaluation of patient's condition. ____________________________________________   INITIAL IMPRESSION / ASSESSMENT AND PLAN / ED COURSE  Pertinent labs & imaging results that were available during my care of the patient were reviewed by me and considered in my medical decision making (see chart for details).  45 year old male with unknown PMH presents after cardiac arrest.  Per EMS, he was witnessed to collapse by coworkers and CPR was started in the field.  He was found to be in ventricular fibrillation and successfully defibrillated with ROSC before ED arrival.  EKG showed anterior and lateral ST elevations.  I attempted to review the patient's chart in Epic but he has no prior visits or admissions here.  Code STEMI was activated in the field.  On arrival to the ED, the patient had a pulse and a King airway was in place.  Dr. Kirke Corin from cardiology was at the bedside.  The patient was moved to the ED stretcher, placed on the defibrillator and monitor.  He was intubated successfully on the first attempt and given a Versed bolus for  sedation afterwards.  Vital signs were stable.  Dr. Kirke Corin recommended emergent cardiac catheterization and the patient was taken up to the Cath Lab immediately.  ___________________________  [name] was evaluated in Emergency Department on 09/07/2020 for the symptoms described in the history of present illness. He was evaluated in the context of the global COVID-19 pandemic, which necessitated consideration that the patient might be at risk for infection with the SARS-CoV-2 virus that causes COVID-19. Institutional protocols and algorithms that pertain to the evaluation of patients at risk for COVID-19 are in a state of rapid change based on information released by regulatory bodies including the CDC and federal and state organizations. These policies and algorithms were followed during the patient's care in the ED.  ____________________________________________   FINAL CLINICAL IMPRESSION(S) / ED DIAGNOSES  Final diagnoses:  Cardiac arrest (HCC)  ST elevation myocardial infarction (STEMI), unspecified artery (HCC)      NEW MEDICATIONS STARTED DURING THIS VISIT:  There are no discharge medications for this patient.    Note:  This document was prepared using Dragon voice recognition software and may include unintentional dictation errors.   Dionne Bucy, MD 09/07/20 [phone]

## 2020-09-07 NOTE — Plan of Care (Addendum)
Report received from Clydia Llano, RN. Patient admitted S/P STEMI w/ PCI to ICU: intubated on sedation.

## 2020-09-07 NOTE — Code Documentation (Addendum)
Pt arrived EMS from work. Per EMS, pt had CP all of a sudden. Pt fell out on the floor and coworkers started CPR on him. EMS states initial rhythm was v fib. 1 EPI given. EMS shocked twice. Pulses present on arrival. [place] airway,

## 2020-09-07 NOTE — Consult Note (Signed)
PHARMACY CONSULT NOTE  Pharmacy Consult for Electrolyte Monitoring and Replacement   Recent Labs: Potassium (mmol/L)  Date Value  09/07/2020 2.8 (L)   Calcium (mg/dL)  Date Value  76/28/3151 9.0   Albumin (g/dL)  Date Value  76/16/0737 3.7   Sodium (mmol/L)  Date Value  09/07/2020 137    Assessment: Patient is a 45 y/o M with unknown PMH who presented to the ED after outside of hospital Vfib cardiac arrest secondary to STEMI. ACLS was initiated with ROSC. Patient went for emergent cardiac catheterization with successful angioplasty and DES placed to ostial LAD which was completely occluded. Patient is intubated, sedated, and on mechanical ventilation in the ICU. Pharmacy has been consulted to assist with electrolyte monitoring and replacement as indicated.  Goal of Therapy:  K > 4 Mg > 2 All other electrolytes within normal limits  Plan:  3/14 admission labs: K 2.8, Mg 2.2, Scr 1.43 --KCl 40 mEq per tube x 1 plus KCl 10 mEq IV x 6 --Re-check electrolytes with AM labs  Tressie Ellis 09/07/2020 7:17 PM

## 2020-09-07 NOTE — Sedation Documentation (Signed)
Pt arrived EMS from work. Per EMS, pt had CP all of a sudden. Pt fell out on the floor and coworkers started CPR on him. EMS states initial rhythm was v fib. 1 EPI given. EMS shocked twice. Pulses present on arrival. [place] airway.

## 2020-09-07 NOTE — H&P (Addendum)
NAME:  Esaias Cleavenger, MRN:  [id number], DOB:  13-Aug-1975, LOS: 0 ADMISSION DATE:  09/07/2020, CONSULTATION DATE:09/07/2020  REFERRING MD: Raliegh Ip  CHIEF COMPLAINT:  Cardiac arrest  Brief History:  45 yo male with acute and massive MI cardiac arrest and acute and severe hypoxic resp failure, due to severe ischemic cardiomyopathy and severe systolic CHF  History of Present Illness:  45 y.o. male with unknown PMH who presents after cardiac arrest.   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   He then said "oh shit" and his coworkers witnessed him collapse.   He was found to not have a pulse and CPR was initiated by bystanders.   When EMS arrived, the patient was found to be in ventricular fibrillation.   He received 2 shocks and a single dose of epinephrine and had ROSC.  EKG postarrest showed evidence of STEMI.    Patient had king airway placed and replaced with ETT CODE STEMI called and patient taken to cath lab  Admitted to ICU for cardiac arrest/resp failure  Past Medical History:   Active Ambulatory Problems    Diagnosis Date Noted  . No Active Ambulatory Problems   Resolved Ambulatory Problems    Diagnosis Date Noted  . No Resolved Ambulatory Problems   No Additional Past Medical History     Significant Hospital Events:  3/14 acute MI s/p cardiac arrest s/p CATH  Consults:  CARDIOLOGY  PCCM  Procedures:  3/14 CATH-left ventricular ejection fraction is 25-35% by visual estimate.  There is moderate to severe left ventricular systolic dysfunction.  LV end diastolic pressure is moderately elevated.  Ost LAD to Prox LAD lesion is 100% stenosed.  Post intervention, there is a 0% residual stenosis.  A drug-eluting stent was successfully placed using a STENT RESOLUTE ONYX 3.5X12.  Mid LAD lesion is 30% stenosed.  Micro Data:  VIRAL PANEL NEG COVID NEG     Objective   Blood pressure (!) 123/97, pulse (!) 108, resp. rate 20, height _0   (1.702 m), weight 80 kg, SpO2 100 %.    Vent Mode: AC FiO2 (%):  [100 %] 100 % Set Rate:  [18 bmp] 18 bmp Vt Set:  [500 mL] 500 mL PEEP:  [5 cmH20] 5 cmH20  No intake or output data in the 24 hours ending 09/07/20 1804 Filed Weights   09/07/20 1621  Weight: 80 kg    REVIEW OF SYSTEMS  PATIENT IS UNABLE TO PROVIDE COMPLETE REVIEW OF SYSTEM S DUE TO SEVERE CRITICAL ILLNESS AND ENCEPHALOPATHY   PHYSICAL EXAMINATION:  GENERAL:critically ill appearing, +resp distress HEAD: Normocephalic, atraumatic.  EYES: Pupils equal, round, reactive to light.  No scleral icterus.  MOUTH: Moist mucosal membrane. NECK: Supple. No thyromegaly. No nodules. No JVD.  PULMONARY: +rhonchi, +wheezing CARDIOVASCULAR: S1 and S2. Regular rate and rhythm. No murmurs, rubs, or gallops.  GASTROINTESTINAL: Soft, nontender, -distended. Positive bowel sounds.  MUSCULOSKELETAL: No swelling, clubbing, or edema.  NEUROLOGIC: obtunded SKIN:intact,warm,dry     Assessment & Plan:  45 yo male with acute and massive MI cardiac arrest and acute and severe hypoxic resp failure, due to severe ischemic cardiomyopathy and severe systolic CHF  At this time, patient had immediate CPR and lasted 5-7 mins based on report and was very agitated and trying to pull out ETT, patient was given propofol for sedation, at this time, Hypothermia Protocol may NOT be beneficial in this case   Severe ACUTE Hypoxic and Hypercapnic Respiratory Failure -  continue Mechanical Ventilator support -continue Bronchodilator Therapy -Wean Fio2 and PEEP as tolerated -VAP/VENT bundle implementation -will perform SAT/SBT when respiratory parameters are met   ACUTE SYSTOLIC CARDIAC FAILURE- EF 25% ACUTE MI -oxygen as needed -Lasix as tolerated -follow up cardiac enzymes as indicated -follow up cardiology recs  ACUTE KIDNEY INJURY/Renal Failure -continue Foley Catheter-assess need -Avoid nephrotoxic agents -Follow urine output, BMP -Ensure  adequate renal perfusion, optimize oxygenation -Renal dose medications   GI GI PROPHYLAXIS as indicated  NUTRITIONAL STATUS DIET-->NPO Constipation protocol as indicated  ENDO - ICU hypoglycemic\Hyperglycemia protocol -check FSBS per protocol  ACUTE ANEMIA- TRANSFUSE AS NEEDED DVT PRX with TED/SCD's ONLY TRANSFUSE if HGB<7     DVT/GI PRX ordered and assessed TRANSFUSIONS AS NEEDED MONITOR FSBS I Assessed the need for Labs I Assessed the need for Foley I Assessed the need for Central Venous Line Family Discussion when available I Assessed the need for Mobilization I made an Assessment of medications to be adjusted accordingly Safety Risk assessment completed  CASE DISCUSSED IN MULTIDISCIPLINARY ROUNDS WITH ICU TEAM   Best practice (evaluated daily)  Diet: NPO Pain/Anxiety/Delirium protocol (if indicated): ordered VAP protocol (if indicated): ordered DVT prophylaxis:Aggrastat drip GI prophylaxis: pepcid Mobility:bedrest Disposition:ICU   Labs   CBC: Recent Labs  Lab 09/07/20 1541  WBC 17.8*  NEUTROABS 8.9*  HGB 13.3  HCT 41.3  MCV 96.3  PLT 062    Basic Metabolic Panel: Recent Labs  Lab 09/07/20 1541  NA 137  K 2.8*  CL 104  CO2 15*  GLUCOSE 340*  BUN 10  CREATININE 1.43*  CALCIUM 9.0   GFR: Estimated Creatinine Clearance: 67.5 mL/min (A) (by C-G formula based on SCr of 1.43 mg/dL (H)). Recent Labs  Lab 09/07/20 1541  WBC 17.8*    Liver Function Tests: Recent Labs  Lab 09/07/20 1541  AST 150*  ALT 130*  ALKPHOS 90  BILITOT 0.8  PROT 6.8  ALBUMIN 3.7   No results for input(s): LIPASE, AMYLASE in the last 168 hours. No results for input(s): AMMONIA in the last 168 hours.  ABG    Component Value Date/Time   PHART 7.21 (L) 09/07/2020 1612   PCO2ART 49 (H) 09/07/2020 1612   PO2ART 133 (H) 09/07/2020 1612   HCO3 19.6 (L) 09/07/2020 1612   ACIDBASEDEF 8.4 (H) 09/07/2020 1612   O2SAT 98.4 09/07/2020 1612     Coagulation  Profile: No results for input(s): INR, PROTIME in the last 168 hours.  Cardiac Enzymes: No results for input(s): CKTOTAL, CKMB, CKMBINDEX, TROPONINI in the last 168 hours.  HbA1C: No results found for: HGBA1C  CBG: No results for input(s): GLUCAP in the last 168 hours.   Past Medical History:  He,  has no past medical history on file.    Social History:    +smoker  Family History:  His family history is not on file.   Allergies Not on File   Home Medications  Prior to Admission medications   Not on File      Critical Care Time devoted to patient care services described in this note is 65  minutes.   Overall, patient is critically ill, prognosis is guarded.  Patient with Multiorgan failure and at high risk for cardiac arrest and death.    Corrin Parker, M.D.  [facility]  Medical Director [place] Director [facility] Cardio-Pulmonary Department

## 2020-09-07 NOTE — Consult Note (Signed)
MEDICATION RELATED CONSULT NOTE  Pharmacy Consult for Aggrastat x 12h Indication: STEMI  Patient Measurements: Height: 5\' 7"  (170.2 cm) Weight: 80 kg (176 lb 5.9 oz) IBW/kg (Calculated) : 66.1  Labs: Recent Labs    09/07/20 1541  WBC 17.8*  HGB 13.3  HCT 41.3  PLT 288  CREATININE 1.43*  ALBUMIN 3.7  PROT 6.8  AST 150*  ALT 130*  ALKPHOS 90  BILITOT 0.8   Estimated Creatinine Clearance: 67.5 mL/min (A) (by C-G formula based on SCr of 1.43 mg/dL (H)).  Plan:  Aggrastat 0.15 mcg/kg/min x 12h per consult instructions (until 3/15 at 0600)  4/15 09/07/2020,6:54 PM

## 2020-09-07 NOTE — Sedation Documentation (Signed)
Dr. Arida, MD cardiology at bedside.  

## 2020-09-08 ENCOUNTER — Inpatient Hospital Stay: Payer: No Typology Code available for payment source

## 2020-09-08 ENCOUNTER — Inpatient Hospital Stay (HOSPITAL_COMMUNITY)
Admit: 2020-09-08 | Discharge: 2020-09-08 | Disposition: A | Discharge status: Home / Self Care | Payer: No Typology Code available for payment source | Attending: Cardiovascular Disease | Admitting: Cardiovascular Disease

## 2020-09-08 ENCOUNTER — Other Ambulatory Visit: Payer: Self-pay

## 2020-09-08 ENCOUNTER — Encounter: Payer: Self-pay | Admitting: Cardiovascular Disease

## 2020-09-08 DIAGNOSIS — I2102 ST elevation (STEMI) myocardial infarction involving left anterior descending coronary artery: Secondary | ICD-10-CM

## 2020-09-08 DIAGNOSIS — I469 Cardiac arrest, cause unspecified: Secondary | ICD-10-CM

## 2020-09-08 DIAGNOSIS — I213 ST elevation (STEMI) myocardial infarction of unspecified site: Secondary | ICD-10-CM

## 2020-09-08 LAB — HEPATIC FUNCTION PANEL
ALT: 155 U/L — ABNORMAL HIGH (ref 0–44)
AST: 291 U/L — ABNORMAL HIGH (ref 15–41)
Albumin: 3.5 g/dL (ref 3.5–5.0)
Alkaline Phosphatase: 87 U/L (ref 38–126)
Bilirubin, Direct: 0.2 mg/dL (ref 0.0–0.2)
Indirect Bilirubin: 0.7 mg/dL (ref 0.3–0.9)
Total Bilirubin: 0.9 mg/dL (ref 0.3–1.2)
Total Protein: 6.4 g/dL — ABNORMAL LOW (ref 6.5–8.1)

## 2020-09-08 LAB — CBC WITH DIFFERENTIAL/PLATELET
Abs Immature Granulocytes: 0.05 10*3/uL (ref 0.00–0.07)
Basophils Absolute: 0.1 10*3/uL (ref 0.0–0.1)
Basophils Relative: 1 %
Eosinophils Absolute: 0.1 10*3/uL (ref 0.0–0.5)
Eosinophils Relative: 1 %
HCT: 36 % — ABNORMAL LOW (ref 39.0–52.0)
Hemoglobin: 12.1 g/dL — ABNORMAL LOW (ref 13.0–17.0)
Immature Granulocytes: 0 %
Lymphocytes Relative: 12 %
Lymphs Abs: 1.7 10*3/uL (ref 0.7–4.0)
MCH: 31.7 pg (ref 26.0–34.0)
MCHC: 33.6 g/dL (ref 30.0–36.0)
MCV: 94.2 fL (ref 80.0–100.0)
Monocytes Absolute: 1 10*3/uL (ref 0.1–1.0)
Monocytes Relative: 7 %
Neutro Abs: 11.7 10*3/uL — ABNORMAL HIGH (ref 1.7–7.7)
Neutrophils Relative %: 79 %
Platelets: 273 10*3/uL (ref 150–400)
RBC: 3.82 MIL/uL — ABNORMAL LOW (ref 4.22–5.81)
RDW: 12.9 % (ref 11.5–15.5)
WBC: 14.6 10*3/uL — ABNORMAL HIGH (ref 4.0–10.5)
nRBC: 0 % (ref 0.0–0.2)

## 2020-09-08 LAB — ECHOCARDIOGRAM COMPLETE
AR max vel: 2.82 cm2
AV Area VTI: 3 cm2
AV Area mean vel: 2.57 cm2
AV Mean grad: 2 mmHg
AV Peak grad: 3.7 mmHg
Ao pk vel: 0.96 m/s
Area-P 1/2: 2.92 cm2
Calc EF: 34 %
Height: 67 in
S' Lateral: 3.18 cm
Single Plane A2C EF: 41.9 %
Single Plane A4C EF: 30.9 %
Weight: 3079.39 oz

## 2020-09-08 LAB — URINALYSIS, ROUTINE W REFLEX MICROSCOPIC
Bilirubin Urine: NEGATIVE
Glucose, UA: NEGATIVE mg/dL
Ketones, ur: 5 mg/dL — AB
Leukocytes,Ua: NEGATIVE
Nitrite: NEGATIVE
Protein, ur: 30 mg/dL — AB
Specific Gravity, Urine: 1.034 — ABNORMAL HIGH (ref 1.005–1.030)
Squamous Epithelial / HPF: NONE SEEN (ref 0–5)
pH: 5 (ref 5.0–8.0)

## 2020-09-08 LAB — BASIC METABOLIC PANEL
Anion gap: 5 (ref 5–15)
BUN: 18 mg/dL (ref 6–20)
CO2: 23 mmol/L (ref 22–32)
Calcium: 8.1 mg/dL — ABNORMAL LOW (ref 8.9–10.3)
Chloride: 109 mmol/L (ref 98–111)
Creatinine, Ser: 1.25 mg/dL — ABNORMAL HIGH (ref 0.61–1.24)
GFR, Estimated: >60 mL/min (ref >60)
Glucose, Bld: 138 mg/dL — ABNORMAL HIGH (ref 70–99)
Potassium: 5.7 mmol/L — ABNORMAL HIGH (ref 3.5–5.1)
Sodium: 137 mmol/L (ref 135–145)

## 2020-09-08 LAB — CBC
HCT: 41.4 % (ref 39.0–52.0)
Hemoglobin: 13.9 g/dL (ref 13.0–17.0)
MCH: 31.3 pg (ref 26.0–34.0)
MCHC: 33.6 g/dL (ref 30.0–36.0)
MCV: 93.2 fL (ref 80.0–100.0)
Platelets: 308 10*3/uL (ref 150–400)
RBC: 4.44 MIL/uL (ref 4.22–5.81)
RDW: 12.7 % (ref 11.5–15.5)
WBC: 21.6 10*3/uL — ABNORMAL HIGH (ref 4.0–10.5)
nRBC: 0 % (ref 0.0–0.2)

## 2020-09-08 LAB — PROTEIN / CREATININE RATIO, URINE
Creatinine, Urine: 207 mg/dL
Protein Creatinine Ratio: 0.24 mg/mg{Cre} — ABNORMAL HIGH (ref 0.00–0.15)
Total Protein, Urine: 49 mg/dL

## 2020-09-08 LAB — HEMOGLOBIN A1C
Hgb A1c MFr Bld: 6 % — ABNORMAL HIGH (ref 4.8–5.6)
Mean Plasma Glucose: 125.5 mg/dL

## 2020-09-08 LAB — URINE DRUG SCREEN, QUALITATIVE (ARMC ONLY)
Amphetamines, Ur Screen: POSITIVE — AB
Barbiturates, Ur Screen: NOT DETECTED
Benzodiazepine, Ur Scrn: POSITIVE — AB
Cannabinoid 50 Ng, Ur ~~LOC~~: NOT DETECTED
Cocaine Metabolite,Ur ~~LOC~~: NOT DETECTED
MDMA (Ecstasy)Ur Screen: NOT DETECTED
Methadone Scn, Ur: NOT DETECTED
Opiate, Ur Screen: NOT DETECTED
Phencyclidine (PCP) Ur S: NOT DETECTED
Tricyclic, Ur Screen: NOT DETECTED

## 2020-09-08 LAB — PROTIME-INR
INR: 1.2 (ref 0.8–1.2)
Prothrombin Time: 15.2 seconds (ref 11.4–15.2)

## 2020-09-08 LAB — GLUCOSE, CAPILLARY
Glucose-Capillary: 143 mg/dL — ABNORMAL HIGH (ref 70–99)
Glucose-Capillary: 159 mg/dL — ABNORMAL HIGH (ref 70–99)
Glucose-Capillary: 161 mg/dL — ABNORMAL HIGH (ref 70–99)
Glucose-Capillary: 171 mg/dL — ABNORMAL HIGH (ref 70–99)

## 2020-09-08 LAB — TRIGLYCERIDES: Triglycerides: 133 mg/dL (ref <150)

## 2020-09-08 LAB — PROCALCITONIN
Procalcitonin: 1.62 ng/mL
Procalcitonin: 1.37 ng/mL

## 2020-09-08 LAB — LACTIC ACID, PLASMA
Lactic Acid, Venous: 1.6 mmol/L (ref 0.5–1.9)
Lactic Acid, Venous: 1.3 mmol/L (ref 0.5–1.9)

## 2020-09-08 LAB — POTASSIUM
Potassium: 6.1 mmol/L — ABNORMAL HIGH (ref 3.5–5.1)
Potassium: 4.5 mmol/L (ref 3.5–5.1)

## 2020-09-08 LAB — MAGNESIUM: Magnesium: 1.7 mg/dL (ref 1.7–2.4)

## 2020-09-08 LAB — APTT: aPTT: 34 seconds (ref 24–36)

## 2020-09-08 LAB — FIBRINOGEN: Fibrinogen: 518 mg/dL — ABNORMAL HIGH (ref 210–475)

## 2020-09-08 LAB — TROPONIN I (HIGH SENSITIVITY): Troponin I (High Sensitivity): >27000 ng/L (ref <18)

## 2020-09-08 LAB — PHOSPHORUS: Phosphorus: 2.6 mg/dL (ref 2.5–4.6)

## 2020-09-08 MED ORDER — AMPICILLIN-SULBACTAM SODIUM 3 (2-1) G IJ SOLR
3.0000 g | Freq: Four times a day (QID) | INTRAMUSCULAR | Status: DC
  Administered 2020-09-08 – 2020-09-12 (×16): 3 g via INTRAVENOUS
  Filled 2020-09-08 (×5): qty 3
  Filled 2020-09-08: qty 8
  Filled 2020-09-08 (×2): qty 3
  Filled 2020-09-08: qty 8
  Filled 2020-09-08 (×5): qty 3
  Filled 2020-09-08 (×4): qty 8
  Filled 2020-09-08 (×2): qty 3

## 2020-09-08 MED ORDER — NOREPINEPHRINE 4 MG/250ML-% IV SOLN
0.0000 ug/min | INTRAVENOUS | Status: DC
  Administered 2020-09-08: 10 ug/min via INTRAVENOUS
  Administered 2020-09-09: 6 ug/min via INTRAVENOUS
  Administered 2020-09-09: 10 ug/min via INTRAVENOUS
  Administered 2020-09-10: 2 ug/min via INTRAVENOUS
  Filled 2020-09-08 (×4): qty 250

## 2020-09-08 MED ORDER — DOCUSATE SODIUM 50 MG/5ML PO LIQD
100.0000 mg | Freq: Two times a day (BID) | ORAL | Status: DC
  Administered 2020-09-08 – 2020-09-09 (×3): 100 mg
  Filled 2020-09-08 (×4): qty 10

## 2020-09-08 MED ORDER — INSULIN ASPART 100 UNIT/ML ~~LOC~~ SOLN
0.0000 [IU] | SUBCUTANEOUS | Status: DC
  Administered 2020-09-08: 1 [IU] via SUBCUTANEOUS
  Administered 2020-09-08 – 2020-09-09 (×5): 2 [IU] via SUBCUTANEOUS
  Administered 2020-09-09: 1 [IU] via SUBCUTANEOUS
  Administered 2020-09-09 – 2020-09-10 (×3): 2 [IU] via SUBCUTANEOUS
  Administered 2020-09-10 – 2020-09-12 (×8): 1 [IU] via SUBCUTANEOUS
  Administered 2020-09-12 – 2020-09-13 (×2): 2 [IU] via SUBCUTANEOUS
  Filled 2020-09-08 (×17): qty 1

## 2020-09-08 MED ORDER — FENTANYL BOLUS VIA INFUSION
50.0000 ug | INTRAVENOUS | Status: DC | PRN
  Filled 2020-09-08: qty 50

## 2020-09-08 MED ORDER — POLYETHYLENE GLYCOL 3350 17 G PO PACK
17.0000 g | PACK | Freq: Every day | ORAL | Status: DC
  Administered 2020-09-09: 17 g
  Filled 2020-09-08 (×2): qty 1

## 2020-09-08 MED ORDER — SODIUM ZIRCONIUM CYCLOSILICATE 5 G PO PACK
10.0000 g | PACK | Freq: Once | ORAL | Status: AC
  Administered 2020-09-08: 10 g
  Filled 2020-09-08: qty 2

## 2020-09-08 MED ORDER — INSULIN ASPART 100 UNIT/ML IV SOLN
10.0000 [IU] | Freq: Once | INTRAVENOUS | Status: AC
  Administered 2020-09-08: 10 [IU] via INTRAVENOUS
  Filled 2020-09-08: qty 0.1

## 2020-09-08 MED ORDER — MIDAZOLAM HCL 2 MG/2ML IJ SOLN: 2.0000 mg | INTRAMUSCULAR | Status: DC | PRN

## 2020-09-08 MED ORDER — DEXTROSE 50 % IV SOLN
1.0000 | Freq: Once | INTRAVENOUS | Status: AC
  Administered 2020-09-08: 50 mL via INTRAVENOUS
  Filled 2020-09-08: qty 50

## 2020-09-08 MED ORDER — CALCIUM GLUCONATE-NACL 1-0.675 GM/50ML-% IV SOLN
1.0000 g | Freq: Once | INTRAVENOUS | Status: AC
  Administered 2020-09-08: 1000 mg via INTRAVENOUS
  Filled 2020-09-08: qty 50

## 2020-09-08 MED ORDER — FUROSEMIDE 10 MG/ML IJ SOLN: 60.0000 mg | Freq: Once | INTRAMUSCULAR | Status: DC

## 2020-09-08 MED ORDER — METHYLPREDNISOLONE SODIUM SUCC 40 MG IJ SOLR
20.0000 mg | Freq: Two times a day (BID) | INTRAMUSCULAR | Status: DC
  Administered 2020-09-08 – 2020-09-10 (×6): 20 mg via INTRAVENOUS
  Filled 2020-09-08 (×6): qty 1

## 2020-09-08 NOTE — Progress Notes (Signed)
  Chaplain On-Call was making rounds on the Unit and met the patient's mother Jackelyn Poling outside the patient's room.  Jackelyn Poling stated that the patient had a heart attack while at work yesterday, and that he is intubated and sedated.  She requested prayer, which this Chaplain provided along with spiritual and emotional support.  Chaplain assured Debbie that her Doristine Bosworth would be welcomed to visit with her, and that she can make the patient's Nurse aware of her request.  Chaplain Pollyann Samples M.Div., [facility]

## 2020-09-08 NOTE — Progress Notes (Signed)
1700: Propofol held and fentanyl reduced by half to 122mcg/hr for neuro exam. After several minutes patient spontaneously opened eyes, moved all four extremities. Purposeful movement in bilateral upper extremities, reaching for tube. Did not follow commands to squeeze with either hand. Moved BLE vigorously; when told to stretch his leg out, patient relaxed left leg from drawn up position to flat against the bed. Opened eyes and turned head toward RN voice inconsistently.   1730: Propofol turned back on at previous rate of 51mcg/kg/min, fentanyl remains at 158mcg/hr.

## 2020-09-08 NOTE — Progress Notes (Signed)
Pharmacy Antibiotic Note  Adam Carter is a 45 y.o. male admitted on 09/07/2020 with pneumonia.  Pharmacy has been consulted for Unasyn dosing.  CrCl = 67.5 ml/min   Plan: Unasyn 3 gm IV Q6H ordered to start on 3/15 @ ~ 0100.   Height: 5\' 7"  (170.2 cm) Weight: 80 kg (176 lb 5.9 oz) IBW/kg (Calculated) : 66.1  Temp (24hrs), Avg:98.6 F (37 C), Min:97.34 F (36.3 C), Max:102 F (38.9 C)  Recent Labs  Lab 09/07/20 1541  WBC 17.8*  CREATININE 1.43*    Estimated Creatinine Clearance: 67.5 mL/min (A) (by C-G formula based on SCr of 1.43 mg/dL (H)).    Not on File  Antimicrobials this admission:   >>    >>   Dose adjustments this admission:   Microbiology results:  BCx:   UCx:    Sputum:    MRSA PCR:   Thank you for allowing pharmacy to be a part of this patient's care.  Shereece Wellborn D 09/08/2020 1:15 AM

## 2020-09-08 NOTE — Progress Notes (Addendum)
Progress Note  Patient Name: Adam Carter Date of Encounter: 09/08/2020  Primary Cardiologist: Kathlyn Sacramento, MD  Subjective   Intubated and sedated.  Mother at bedside.  Inpatient Medications    Scheduled Meds: . aspirin  81 mg Per Tube Daily  . atorvastatin  80 mg Per Tube q1800  . chlorhexidine gluconate (MEDLINE KIT)  15 mL Mouth Rinse BID  . Chlorhexidine Gluconate Cloth  6 each Topical Daily  . docusate  100 mg Per Tube BID  . enoxaparin (LOVENOX) injection  40 mg Subcutaneous Q24H  . insulin aspart  0-9 Units Subcutaneous Q4H  . mouth rinse  15 mL Mouth Rinse 10 times per day  . polyethylene glycol  17 g Per Tube Daily  . sodium chloride flush  3 mL Intravenous Q12H  . sodium chloride flush  3 mL Intravenous Q12H  . ticagrelor  90 mg Per Tube BID   Continuous Infusions: . sodium chloride    . sodium chloride    . sodium chloride 20 mL/hr at 09/08/20 0800  . ampicillin-sulbactam (UNASYN) IV 3 g (09/08/20 0815)  . calcium gluconate    . famotidine (PEPCID) IV Stopped (09/07/20 2109)  . fentaNYL infusion INTRAVENOUS 200 mcg/hr (09/08/20 0800)  . norepinephrine (LEVOPHED) Adult infusion 10 mcg/min (09/08/20 0800)  . propofol (DIPRIVAN) infusion 30 mcg/kg/min (09/08/20 0800)   PRN Meds: sodium chloride, sodium chloride, acetaminophen, docusate, fentaNYL, midazolam, ondansetron (ZOFRAN) IV, polyethylene glycol, sodium chloride flush, sodium chloride flush   Vital Signs    Vitals:   09/08/20 0815 09/08/20 0822 09/08/20 0830 09/08/20 0845  BP: (!) 88/61  (!) 89/63 91/63  Pulse: 85  85 83  Resp: 18  18 18   Temp: 99.5 F (37.5 C)  99.5 F (37.5 C) 99.5 F (37.5 C)  TempSrc:      SpO2: 95% 95% 94% 95%  Weight:      Height:        Intake/Output Summary (Last 24 hours) at 09/08/2020 0939 Last data filed at 09/08/2020 0800 Gross per 24 hour  Intake 1409.92 ml  Output 1410 ml  Net -0.08 ml   Filed Weights   09/07/20 1621 09/08/20 0500  Weight: 80 kg 87.3 kg     Physical Exam   GEN: Well nourished, well developed, in no acute distress.  HEENT: Grossly normal.  Neck: Supple, no JVD, carotid bruits, or masses. Cardiac: RRR, no murmurs, rubs, or gallops. No clubbing, cyanosis, edema.  Radials 2+, DP/PT 2+ and equal bilaterally. R groin cath site w/o bleeding/bruit/hematoma. Respiratory:  Respirations regular and unlabored, coarse breath sounds bilat. GI: Soft, nontender, nondistended, BS + x 4. MS: no deformity or atrophy. Skin: warm and dry, no rash. Neuro:  Sedated. Psych: Sedated.  Labs    Chemistry Recent Labs  Lab 09/07/20 1541 09/08/20 0131 09/08/20 0615  NA 137 137  --   K 2.8* 5.7* 6.1*  CL 104 109  --   CO2 15* 23  --   GLUCOSE 340* 138*  --   BUN 10 18  --   CREATININE 1.43* 1.25*  --   CALCIUM 9.0 8.1*  --   PROT 6.8 6.4*  --   ALBUMIN 3.7 3.5  --   AST 150* 291*  --   ALT 130* 155*  --   ALKPHOS 90 87  --   BILITOT 0.8 0.9  --   GFRNONAA >60 >60  --   ANIONGAP 18* 5  --      Hematology  Recent Labs  Lab 09/07/20 1541 09/08/20 0131  WBC 17.8* 21.6*  RBC 4.29 4.44  HGB 13.3 13.9  HCT 41.3 41.4  MCV 96.3 93.2  MCH 31.0 31.3  MCHC 32.2 33.6  RDW 12.3 12.7  PLT 288 308    Cardiac Enzymes  Recent Labs  Lab 09/07/20 1541 09/07/20 1842 09/08/20 0130  TROPONINIHS 3,635* >27,000* >27,000*      Lipids  Lab Results  Component Value Date   TRIG 133 09/08/2020    Radiology    DG Abd 1 View  Result Date: 09/08/2020 CLINICAL DATA:  Intubated, enteric catheter placement EXAM: ABDOMEN - 1 VIEW COMPARISON:  None. FINDINGS: Single frontal view of the upper abdomen was obtained. The right flank, hemidiaphragms, and pelvis are excluded by collimation. Enteric catheter tip and side port project over the gastric body. External defibrillator pads overlie the lower chest. Bowel gas pattern is unremarkable. IMPRESSION: 1. Enteric catheter overlying the gastric body. Electronically Signed   By: Randa Ngo M.D.    On: 09/08/2020 01:22   DG Chest Port 1 View  Result Date: 09/08/2020 CLINICAL DATA:  Central line placement. collapsed on side of the road unresponsive and pulseless per ED note. EXAM: PORTABLE CHEST 1 VIEW COMPARISON:  Chest x-ray 09/08/2020 12:59 a.m. FINDINGS: Interval retraction of an endotracheal tube with tip terminating 2.5-3 cm above the carina. Enteric tube coursing below the hemidiaphragm with side port overlying the expected region of the gastric lumen and tip collimated off view. Interval placement of a left internal central jugular venous catheter with tip overlying the expected region of the superior cavoatrial junction. The heart size and mediastinal contours are unchanged. Low lung volumes with hilar and bibasilar patchy airspace opacities. Likely trace bilateral pole effusions. No pneumothorax. No acute osseous abnormality. IMPRESSION: 1. Low lung volumes with hilar and bibasilar patchy airspace opacities that could represent a combination of edema and infection/inflammation. 2. Likely trace bilateral pleural effusions. 3. Endotracheal tube, enteric tube, left internal central jugular venous catheter in appropriate position. Electronically Signed   By: Iven Finn M.D.   On: 09/08/2020 06:41   DG Chest Port 1 View  Result Date: 09/08/2020 CLINICAL DATA:  Intubated, cardiac arrest EXAM: PORTABLE CHEST 1 VIEW COMPARISON:  None. FINDINGS: Single frontal view of the chest demonstrates endotracheal tube overlying tracheal air column tip approximately 1.6 cm above carina. Enteric catheter passes below diaphragm tip excluded by collimation, side port projecting over gastric body. External defibrillator pads overlie the heart. Cardiac silhouette is enlarged. There is increased central vascular congestion with bibasilar consolidation and effusions. No pneumothorax. No acute bony abnormalities. IMPRESSION: 1. Support devices as above. 2. Pulmonary edema, with bilateral pleural effusions.  Electronically Signed   By: Randa Ngo M.D.   On: 09/08/2020 01:21    Telemetry    RSR - sinus tachycardia - Personally Reviewed  Cardiac Studies   Cardiac Catheterization 3.14.2022  Left Main  Vessel is angiographically normal.  Left Anterior Descending  Ost LAD to Prox LAD lesion is 100% stenosed. The lesion is not complex (non high-C) and thrombotic with left-to-right collateral flow. The lesion was not previously treated.      **The LAD was successfully treated with a 3.5 x 58m Resolute Onyx DES**  Mid LAD lesion is 30% stenosed.  Second Septal Branch  Collaterals  2nd Sept filled by collaterals from 3rd Mrg.    Ramus Intermedius  Vessel is angiographically normal.  Left Circumflex  Vessel is angiographically normal.  First  Obtuse Marginal Branch  Vessel is small in size. Vessel is angiographically normal.  Second Obtuse Marginal Branch  Vessel is small in size. Vessel is angiographically normal.  Third Obtuse Marginal Branch  Vessel is angiographically normal.  Right Coronary Artery  Vessel is angiographically normal.  Right Posterior Descending Artery  Vessel is angiographically normal.  Right Posterior Atrioventricular Artery  Vessel is angiographically normal.   LV gram: EF 25-35%  _____________    Patient Profile     45 y.o. male w/ a h/o tobacco abuse, who presented to [facility] 3/14 w/ out of hospital cardiac arrest and Anterolateral STEMI s/p PCI/DES of the occluded LAD 3/14.  Course complicated by asp pna and CHF/ICM (EF 25-35% by LV gram).  Assessment & Plan    1.  Acute anterolateral STEMI/CAD:  Presented following OOH arrest w/ post-arrest ECG notable for anterolateral ST elevation.  Found to have an occluded LAD on cath w/ EF of 25-35%. LVEDP 37mHg.  S/p PCI/DES to the ost/prox LAD.  HsTrop peaked @ > 27k. Mother denies any FH of premature CAD.  Remains intubated, sedated, and on norepi.  Cont asa, statin, brilinta.  No  blocker 2/2  hypotension/shock.  2.  Cardiogenic shock/hypotension: BPs stable on norepi.  Echo pending. Pulm edema on cxr.  Will req CVP monitoring.  IV lasix today.  3.  Acute systolic CHF/ICM:  CXR this AM w/ pulm edema in setting of above.  Will give a dose of lasix this AM.  No  blocker, acei/arb/arni/spiro in setting of hypotension/hyperkalemia.  Echo pending this AM.  ? Need for lifevest @ discharge.  4.  Aspiration PNA/Leukocytosis/Fever:  Pt noted to have copious amts of vomit in oral cavity on admission.  Febrile this AM w/ WBC 21.6. Abx per CCM.  5.  Hypokalemia/Hyperkalemia:  Hypokalemic yesterday s/p repletion w/ subsequent development of hyperkalemia this AM.  Insulin/d50 orderred - per CCM.  6.  AKI: Creat 1.43 on admission following cardiac arrest/CPR.  Creat 1.25 this AM.  Follow.  7.  Substance abuse:  No reported home meds.  UDS + for benzos and amphetamines.  Will need counseling.  8.  Lipids:  Lipid panel ordered for AM.  Cont high potency statin rx.  Signed, CMurray Hodgkins NP  09/08/2020, 9:39 AM    For questions or updates, please contact   Please consult [email or web address] for contact info under Cardiology/STEMI.

## 2020-09-08 NOTE — Procedures (Signed)
Central Venous Catheter Insertion Procedure Note  Shafter Jupin  [id number]  Sep 11, 1975  Date:09/08/20  Time:6:08 AM   Provider Performing:Kathaleya Mcduffee L [place]   Procedure: Insertion of Non-tunneled Central Venous [phone]) with US guidance ([id number])   Indication(s) Medication administration  Consent Risks of the procedure as well as the alternatives and risks of each were explained to the patient and/or caregiver.  Consent for the procedure was obtained and is signed in the bedside chart  Anesthesia Topical only with 1% lidocaine   Timeout Verified patient identification, verified procedure, site/side was marked, verified correct patient position, special equipment/implants available, medications/allergies/relevant history reviewed, required imaging and test results available.  Sterile Technique Maximal sterile technique including full sterile barrier drape, hand hygiene, sterile gown, sterile gloves, mask, hair covering, sterile ultrasound probe cover (if used).  Procedure Description Area of catheter insertion was cleaned with chlorhexidine and draped in sterile fashion.  With real-time ultrasound guidance a central venous catheter was placed into the left internal jugular vein. Nonpulsatile blood flow and easy flushing noted in all ports.  The catheter was sutured in place and sterile dressing applied.  Complications/Tolerance None; patient tolerated the procedure well. Chest X-ray is ordered to verify placement for internal jugular or subclavian cannulation.   Chest x-ray is not ordered for femoral cannulation.  EBL Minimal  Specimen(s) None   Betsey Holiday, AGACNP-BC Acute Care Nurse Practitioner [facility]   [phone] / [phone] Please see Amion for pager details.

## 2020-09-08 NOTE — H&P (Addendum)
NAME:  Adam Carter Nurse, MRN:  [id number], DOB:  07-01-1975, LOS: 1 ADMISSION DATE:  09/07/2020, CONSULTATION DATE:09/08/2020  REFERRING MD: Jerline Pain  CHIEF COMPLAINT:  Cardiac arrest  Brief History:  45 yo male with acute and massive MI cardiac arrest and acute and severe hypoxic resp failure, due to severe ischemic cardiomyopathy and severe systolic CHF  History of Present Illness:  45 y.o. male with unknown PMH who presents after cardiac arrest.   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   He then said "oh shit" and his coworkers witnessed him collapse.   He was found to not have a pulse and CPR was initiated by bystanders.   When EMS arrived, the patient was found to be in ventricular fibrillation.   He received 2 shocks and a single dose of epinephrine and had ROSC.  EKG postarrest showed evidence of STEMI.    Patient had king airway placed and replaced with ETT CODE STEMI called and patient taken to cath lab  Admitted to ICU for cardiac arrest/resp failure  Past Medical History:   Active Ambulatory Problems    Diagnosis Date Noted  . No Active Ambulatory Problems   Resolved Ambulatory Problems    Diagnosis Date Noted  . No Resolved Ambulatory Problems   No Additional Past Medical History     Significant Hospital Events:  3/14 acute MI s/p cardiac arrest s/p CATH 3/15 remains critically ill  Consults:  CARDIOLOGY  PCCM  Procedures:  3/14 CATH-left ventricular ejection fraction is 25-35% by visual estimate.  There is moderate to severe left ventricular systolic dysfunction.  LV end diastolic pressure is moderately elevated.  Ost LAD to Prox LAD lesion is 100% stenosed.  Post intervention, there is a 0% residual stenosis.  A drug-eluting stent was successfully placed using a STENT RESOLUTE ONYX 3.5X12.  Mid LAD lesion is 30% stenosed.  Micro Data:  VIRAL PANEL NEG COVID NEG  INTERVAL HISTORY Remains critically ill multiorgan  failure S/p cardiac arrest      Objective   Blood pressure 93/67, pulse 87, temperature 99.14 F (37.3 C), resp. rate 18, height 5\' 7"  (1.702 m), weight 87.3 kg, SpO2 94 %.    Vent Mode: PRVC FiO2 (%):  [70 %-100 %] 70 % Set Rate:  [18 bmp] 18 bmp Vt Set:  [500 mL] 500 mL PEEP:  [5 cmH20] 5 cmH20   Intake/Output Summary (Last 24 hours) at 09/08/2020 0802 Last data filed at 09/08/2020 0700 Gross per 24 hour  Intake 887.5 ml  Output 1375 ml  Net -487.5 ml   Filed Weights   09/07/20 1621 09/08/20 0500  Weight: 80 kg 87.3 kg    REVIEW OF SYSTEMS  PATIENT IS UNABLE TO PROVIDE COMPLETE REVIEW OF SYSTEM S DUE TO SEVERE CRITICAL ILLNESS AND ENCEPHALOPATHY   PHYSICAL EXAMINATION:  GENERAL:critically ill appearing, +resp distress HEAD: Normocephalic, atraumatic.  EYES: Pupils equal, round, reactive to light.  No scleral icterus.  MOUTH: Moist mucosal membrane. NECK: Supple. No thyromegaly. No nodules. No JVD.  PULMONARY: +rhonchi, +wheezing CARDIOVASCULAR: S1 and S2. Regular rate and rhythm. No murmurs, rubs, or gallops.  GASTROINTESTINAL: Soft, nontender, -distended. Positive bowel sounds.  MUSCULOSKELETAL: No swelling, clubbing, or edema.  NEUROLOGIC: obtunded SKIN:intact,warm,dry       Assessment & Plan:  45 yo male with acute and massive MI cardiac arrest and acute and severe hypoxic resp failure, due to severe ischemic cardiomyopathy and severe systolic CHF, possible poly substance abuse amphetamines  on drug screen  At this time, patient had immediate CPR and lasted 5-7 mins based on report and was very agitated and trying to pull out ETT, patient was given propofol for sedation, at this time, Hypothermia Protocol may NOT be beneficial in this case   Severe ACUTE Hypoxic and Hypercapnic Respiratory Failure -continue Mechanical Ventilator support -continue Bronchodilator Therapy -Wean Fio2 and PEEP as tolerated -VAP/VENT bundle implementation Wean fio2 as  tolerated  ACUTE MI/ACUTE Systolic CHF CARDIOGENIC SHOCK -Lasix as tolerated -follow up cardiac enzymes as indicated -follow up cardiology recs  ACUTE KIDNEY INJURY/Renal Failure -continue Foley Catheter-assess need -Avoid nephrotoxic agents -Follow urine output, BMP -Ensure adequate renal perfusion, optimize oxygenation -Renal dose medications Hyperkalemia-therapy as indicated Consider nephrology consultation  SEVERE HYPERKALEMIA   GI GI PROPHYLAXIS as indicated  NUTRITIONAL STATUS DIET-->TF's as tolerated Constipation protocol as indicated  TOX screen +Amphetamines   ENDO - ICU hypoglycemic\Hyperglycemia protocol -check FSBS per protocol  ACUTE ANEMIA- TRANSFUSE AS NEEDED DVT PRX with TED/SCD's ONLY TRANSFUSE if HGB<7  THERAPY FOR PNEUMONIA Started IV abx Check PROCAL   DVT/GI PRX ordered and assessed TRANSFUSIONS AS NEEDED MONITOR FSBS I Assessed the need for Labs I Assessed the need for Foley I Assessed the need for Central Venous Line Family Discussion when available I Assessed the need for Mobilization I made an Assessment of medications to be adjusted accordingly Safety Risk assessment completed  CASE DISCUSSED IN MULTIDISCIPLINARY ROUNDS WITH ICU TEAM     Best practice (evaluated daily)  Diet: NPO Pain/Anxiety/Delirium protocol (if indicated): ordered VAP protocol (if indicated): ordered DVT prophylaxis:Aggrastat drip GI prophylaxis: pepcid Mobility:bedrest Disposition:ICU   Labs   CBC: Recent Labs  Lab 09/07/20 1541 09/08/20 0131  WBC 17.8* 21.6*  NEUTROABS 8.9*  --   HGB 13.3 13.9  HCT 41.3 41.4  MCV 96.3 93.2  PLT 288 308    Basic Metabolic Panel: Recent Labs  Lab 09/07/20 1541 09/08/20 0131 09/08/20 0615  NA 137 137  --   K 2.8* 5.7* 6.1*  CL 104 109  --   CO2 15* 23  --   GLUCOSE 340* 138*  --   BUN 10 18  --   CREATININE 1.43* 1.25*  --   CALCIUM 9.0 8.1*  --   MG 2.2 1.7  --   PHOS  --  2.6  --     GFR: Estimated Creatinine Clearance: 80.4 mL/min (A) (by C-G formula based on SCr of 1.25 mg/dL (H)). Recent Labs  Lab 09/07/20 1541 09/08/20 0131 09/08/20 0615  PROCALCITON  --  1.62  --   WBC 17.8* 21.6*  --   LATICACIDVEN  --  1.6 1.3    Liver Function Tests: Recent Labs  Lab 09/07/20 1541 09/08/20 0131  AST 150* 291*  ALT 130* 155*  ALKPHOS 90 87  BILITOT 0.8 0.9  PROT 6.8 6.4*  ALBUMIN 3.7 3.5   No results for input(s): LIPASE, AMYLASE in the last 168 hours. No results for input(s): AMMONIA in the last 168 hours.  ABG    Component Value Date/Time   PHART 7.33 (L) 09/08/2020 0500   PCO2ART 43 09/08/2020 0500   PO2ART 129 (H) 09/08/2020 0500   HCO3 22.7 09/08/2020 0500   ACIDBASEDEF 3.2 (H) 09/08/2020 0500   O2SAT 98.7 09/08/2020 0500     Coagulation Profile: No results for input(s): INR, PROTIME in the last 168 hours.  Cardiac Enzymes: No results for input(s): CKTOTAL, CKMB, CKMBINDEX, TROPONINI in the last 168 hours.  HbA1C: No results found for: HGBA1C  CBG: Recent Labs  Lab 09/07/20 1829  GLUCAP 140*     Past Medical History:  He,  has no past medical history on file.    Social History:    +smoker  Family History:  His family history is not on file.   Allergies Not on File   Home Medications  Prior to Admission medications   Not on File      Critical Care Time devoted to patient care services described in this note is 54 minutes.   Overall, patient is critically ill, prognosis is guarded.  Patient with Multiorgan failure and at high risk for cardiac arrest and death.    Lucie Leather, M.D.  [facility]  Medical Director [facility] Medical Director [facility] Cardio-Pulmonary Department

## 2020-09-08 NOTE — Consult Note (Signed)
[facility] Consult Note: Date of consult 09/08/20    Date of Admission:  09/07/2020           Reason for Consult:  Acute kidney injury, hyperkalemia   Referring Provider: Flora Lipps, MD Primary Care Provider: No primary care provider on file.   History of Presenting Illness:  Tywone Bembenek is a 45 y.o. male with unknown past medical history presented after cardiac arrest in the field.  Bystanders provide CPR until EMS arrived.  He received 2 shocks and ACLS resuscitation.  Postarrest, was diagnosed with STEMI.  Underwent cardiac catheterization on 3/14 and had successful angioplasty and drug-eluting stent placed to ostial LAD.  Mild pulmonary hypertension was noted.  Normal cardiac output.   Review of Systems: ROS   Unable to provide review of systems due to critical illness  No past medical history on file.     No family history on file.   OBJECTIVE: Blood pressure 94/66, pulse 95, temperature 99.32 F (37.4 C), resp. rate 18, height _0  (1.702 m), weight 87.3 kg, SpO2 95 %.  Physical Exam  Physical Exam: General:  No acute distress, laying in the bed, critically ill  HEENT  anicteric, moist oral mucous membrane, ETT in place  Pulm/lungs  normal breathing effort, ventilator assisted  CVS/Heart  regular rhythm, no rub or gallop  Abdomen:   Soft, nontender  Extremities:  No peripheral edema  Neurologic:  Sedated  Skin:  No acute rashes  Foley in place   Lab Results Lab Results  Component Value Date   WBC 21.6 (H) 09/08/2020   HGB 13.9 09/08/2020   HCT 41.4 09/08/2020   MCV 93.2 09/08/2020   PLT 308 09/08/2020    Lab Results  Component Value Date   CREATININE 1.25 (H) 09/08/2020   BUN 18 09/08/2020   NA 137 09/08/2020   K 6.1 (H) 09/08/2020   CL 109 09/08/2020   CO2 23 09/08/2020    Lab Results  Component Value Date   ALT 155 (H) 09/08/2020   AST 291 (H) 09/08/2020   ALKPHOS 87 09/08/2020   BILITOT 0.9 09/08/2020      Microbiology: Recent Results (from the past 240 hour(s))  Resp Panel by RT-PCR (Flu A&B, Covid) Nasopharyngeal Swab     Status: None   Collection Time: 09/07/20  3:41 PM   Specimen: Nasopharyngeal Swab; Nasopharyngeal(NP) swabs in vial transport medium  Result Value Ref Range Status   SARS Coronavirus 2 by RT PCR NEGATIVE NEGATIVE Final    Comment: (NOTE) SARS-CoV-2 target nucleic acids are NOT DETECTED.  The SARS-CoV-2 RNA is generally detectable in upper respiratory specimens during the acute phase of infection. The lowest concentration of SARS-CoV-2 viral copies this assay can detect is 138 copies/mL. A negative result does not preclude SARS-Cov-2 infection and should not be used as the sole basis for treatment or other patient management decisions. A negative result may occur with  improper specimen collection/handling, submission of specimen other than nasopharyngeal swab, presence of viral mutation(s) within the areas targeted by this assay, and inadequate number of viral copies(<138 copies/mL). A negative result must be combined with clinical observations, patient history, and epidemiological information. The expected result is Negative.  Fact Sheet for Patients:  EntrepreneurPulse.com.au  Fact Sheet for Healthcare Providers:  IncredibleEmployment.be  This test is no t yet approved or cleared by the [place] FDA and  has been authorized for detection and/or diagnosis of SARS-CoV-2 by FDA under an Emergency Use Authorization (  EUA). This EUA will remain  in effect (meaning this test can be used) for the duration of the COVID-19 declaration under Section 564(b)(1) of the Act, 21 U.S.C.section 360bbb-3(b)(1), unless the authorization is terminated  or revoked sooner.       Influenza A by PCR NEGATIVE NEGATIVE Final   Influenza B by PCR NEGATIVE NEGATIVE Final    Comment: (NOTE) The Xpert Xpress SARS-CoV-2/FLU/RSV plus assay is  intended as an aid in the diagnosis of influenza from Nasopharyngeal swab specimens and should not be used as a sole basis for treatment. Nasal washings and aspirates are unacceptable for Xpert Xpress SARS-CoV-2/FLU/RSV testing.  Fact Sheet for Patients: EntrepreneurPulse.com.au  Fact Sheet for Healthcare Providers: IncredibleEmployment.be  This test is not yet approved or cleared by the [place] FDA and has been authorized for detection and/or diagnosis of SARS-CoV-2 by FDA under an Emergency Use Authorization (EUA). This EUA will remain in effect (meaning this test can be used) for the duration of the COVID-19 declaration under Section 564(b)(1) of the Act, 21 U.S.C. section 360bbb-3(b)(1), unless the authorization is terminated or revoked.  Performed at [facility], [address] [place], [place] [address]   MRSA PCR Screening     Status: None   Collection Time: 09/07/20  6:32 PM   Specimen: Nasopharyngeal Swab  Result Value Ref Range Status   MRSA by PCR NEGATIVE NEGATIVE Final    Comment:        The GeneXpert MRSA Assay (FDA approved for NASAL specimens only), is one component of a comprehensive MRSA colonization surveillance program. It is not intended to diagnose MRSA infection nor to guide or monitor treatment for MRSA infections. Performed at [facility], [address] [place], [place] [address]   CULTURE, BLOOD (ROUTINE X 2) w Reflex to ID Panel     Status: None (Preliminary result)   Collection Time: 09/08/20  1:31 AM   Specimen: BLOOD  Result Value Ref Range Status   Specimen Description BLOOD BLOOD LEFT FOREARM  Final   Special Requests   Final    BOTTLES DRAWN AEROBIC AND ANAEROBIC Blood Culture adequate volume   Culture   Final    NO GROWTH < 12 HOURS Performed at [facility], [address], [place], [place] [address]    Report Status PENDING  Incomplete  CULTURE, BLOOD  (ROUTINE X 2) w Reflex to ID Panel     Status: None (Preliminary result)   Collection Time: 09/08/20  1:31 AM   Specimen: BLOOD  Result Value Ref Range Status   Specimen Description BLOOD BLOOD LEFT HAND  Final   Special Requests   Final    BOTTLES DRAWN AEROBIC AND ANAEROBIC Blood Culture results may not be optimal due to an inadequate volume of blood received in culture bottles   Culture   Final    NO GROWTH < 12 HOURS Performed at [facility], [address] [place],  [address]    Report Status PENDING  Incomplete    Medications: Scheduled Meds: . aspirin  81 mg Per Tube Daily  . atorvastatin  80 mg Per Tube q1800  . chlorhexidine gluconate (MEDLINE KIT)  15 mL Mouth Rinse BID  . Chlorhexidine Gluconate Cloth  6 each Topical Daily  . docusate  100 mg Per Tube BID  . enoxaparin (LOVENOX) injection  40 mg Subcutaneous Q24H  . furosemide  60 mg Intravenous Once  . insulin aspart  0-9 Units Subcutaneous Q4H  .  mouth rinse  15 mL Mouth Rinse 10 times per day  . polyethylene glycol  17 g Per Tube Daily  . sodium chloride flush  3 mL Intravenous Q12H  . sodium chloride flush  3 mL Intravenous Q12H  . ticagrelor  90 mg Per Tube BID   Continuous Infusions: . sodium chloride    . sodium chloride    . sodium chloride 20 mL/hr at 09/08/20 0800  . ampicillin-sulbactam (UNASYN) IV 3 g (09/08/20 0815)  . calcium gluconate 1,000 mg (09/08/20 0942)  . famotidine (PEPCID) IV Stopped (09/07/20 2109)  . fentaNYL infusion INTRAVENOUS 200 mcg/hr (09/08/20 0800)  . norepinephrine (LEVOPHED) Adult infusion 10 mcg/min (09/08/20 0800)  . propofol (DIPRIVAN) infusion 30 mcg/kg/min (09/08/20 0800)   PRN Meds:.sodium chloride, sodium chloride, acetaminophen, docusate, fentaNYL, midazolam, ondansetron (ZOFRAN) IV, polyethylene glycol, sodium chloride flush, sodium chloride flush  Not on File  Urinalysis: No results for input(s): COLORURINE, LABSPEC, PHURINE, GLUCOSEU, HGBUR,  BILIRUBINUR, KETONESUR, PROTEINUR, UROBILINOGEN, NITRITE, LEUKOCYTESUR in the last 72 hours.  Invalid input(s): APPERANCEUR    Imaging: DG Abd 1 View  Result Date: 09/08/2020 CLINICAL DATA:  Intubated, enteric catheter placement EXAM: ABDOMEN - 1 VIEW COMPARISON:  None. FINDINGS: Single frontal view of the upper abdomen was obtained. The right flank, hemidiaphragms, and pelvis are excluded by collimation. Enteric catheter tip and side port project over the gastric body. External defibrillator pads overlie the lower chest. Bowel gas pattern is unremarkable. IMPRESSION: 1. Enteric catheter overlying the gastric body. Electronically Signed   By: Randa Ngo M.D.   On: 09/08/2020 01:22   CARDIAC CATHETERIZATION  Result Date: 09/07/2020  The left ventricular ejection fraction is 25-35% by visual estimate.  There is moderate to severe left ventricular systolic dysfunction.  LV end diastolic pressure is moderately elevated.  Ost LAD to Prox LAD lesion is 100% stenosed.  Post intervention, there is a 0% residual stenosis.  A drug-eluting stent was successfully placed using a STENT RESOLUTE ONYX 3.5X12.  Mid LAD lesion is 30% stenosed.  1.  Out of hospital cardiac arrest with anterior ST elevation myocardial infarction due to ostial occlusion of the LAD.  No other obstructive disease. 2.  Moderately severely reduced LV systolic function with an EF of 25 to 30% with severe anterior/apical hypokinesis. 3.  Successful angioplasty and drug-eluting stent placement to the ostial LAD. 4.  Right heart catheterization was performed after PCI and showed mildly elevated filling pressures, mild pulmonary hypertension and normal cardiac output.  PA sat was 78.7%.  Fick cardiac output was 6.17 with a cardiac index of 3.5.  CPO was 1.4.  Based on these hemodynamic numbers, no hemodynamic support was utilized. Recommendations: Dual antiplatelet therapy for at least 1 year. High-dose atorvastatin. We will run Aggrastat  infusion for 12 hours given uncertainty of oral absorption of antiplatelet medications. The patient had significant agitation and this will need to be managed.  He is currently on propofol drip and fentanyl drip.  I discussed with Dr. Mortimer Fries. Recommend electrolyte replacement. Critical care time of 45 minutes managing abnormal hemodynamics, ventilator management and electrolytes.   DG Chest Port 1 View  Result Date: 09/08/2020 CLINICAL DATA:  Central line placement. collapsed on side of the road unresponsive and pulseless per ED note. EXAM: PORTABLE CHEST 1 VIEW COMPARISON:  Chest x-ray 09/08/2020 12:59 a.m. FINDINGS: Interval retraction of an endotracheal tube with tip terminating 2.5-3 cm above the carina. Enteric tube coursing below the hemidiaphragm with side port overlying the expected region  of the gastric lumen and tip collimated off view. Interval placement of a left internal central jugular venous catheter with tip overlying the expected region of the superior cavoatrial junction. The heart size and mediastinal contours are unchanged. Low lung volumes with hilar and bibasilar patchy airspace opacities. Likely trace bilateral pole effusions. No pneumothorax. No acute osseous abnormality. IMPRESSION: 1. Low lung volumes with hilar and bibasilar patchy airspace opacities that could represent a combination of edema and infection/inflammation. 2. Likely trace bilateral pleural effusions. 3. Endotracheal tube, enteric tube, left internal central jugular venous catheter in appropriate position. Electronically Signed   By: Iven Finn M.D.   On: 09/08/2020 06:41   DG Chest Port 1 View  Result Date: 09/08/2020 CLINICAL DATA:  Intubated, cardiac arrest EXAM: PORTABLE CHEST 1 VIEW COMPARISON:  None. FINDINGS: Single frontal view of the chest demonstrates endotracheal tube overlying tracheal air column tip approximately 1.6 cm above carina. Enteric catheter passes below diaphragm tip excluded by collimation,  side port projecting over gastric body. External defibrillator pads overlie the heart. Cardiac silhouette is enlarged. There is increased central vascular congestion with bibasilar consolidation and effusions. No pneumothorax. No acute bony abnormalities. IMPRESSION: 1. Support devices as above. 2. Pulmonary edema, with bilateral pleural effusions. Electronically Signed   By: Randa Ngo M.D.   On: 09/08/2020 01:21      Assessment/Plan:  Jameire Kouba is a 45 y.o. male with unknown medical problems  was admitted on 09/07/2020 for :  Cardiac arrest [facility]) [I46.9] ST elevation myocardial infarction (STEMI), unspecified artery ([place]) [I21.3]  #Acute kidney injury #Hyperkalemia  Baseline creatinine is unknown No urinalysis available No renal imaging available at this time  AKI likely secondary to hypotension/ATN  Plan: Will obtain urinalysis, urine protein to creatinine ratio Obtain renal imaging  Hyperkalemia likely iatrogenic in setting of AKI -Agree with IV shifting measures - Agree with Lokelma -Monitor closely  We will follow along. Case discussed with patient's mother who is at bedside   Harmeet Candiss Norse 09/08/20

## 2020-09-08 NOTE — Progress Notes (Signed)
Continued blood oozing at central line site. Nose began bleeding from right nare; saline soaked gauze packed in nare. Dr. Belia Heman made aware of bleeding. Orders received for labs including CBC and platelet count.

## 2020-09-08 NOTE — Progress Notes (Signed)
Initial Nutrition Assessment  DOCUMENTATION CODES:   Not applicable  INTERVENTION:   Once tube feeds initiated, recommend:  Vital 1.5 @55ml /hr- Initiate at 4ml/hr and increase by 13ml/hr q 8 hours until goal rate is reached.   Pro-Source 33ml QID via tube, provides 40kcal and 11g of protein per serving   Propofol: 14.4 ml/hr- provides 380kcal/day   Free water flushes 41ml q4 hours to maintain tube patency   Regimen provides 2140kcal/day, 133g/day protein and 1147ml/day free water (with propofol provides 2520kcal/day)  Pt at refeed risk; recommend monitor potassium, magnesium and phosphorus labs daily until stable  NUTRITION DIAGNOSIS:   Inadequate oral intake related to inability to eat (pt sedated and ventilated) as evidenced by NPO status.  GOAL:   Provide needs based on ASPEN/SCCM guidelines  MONITOR:   Vent status,Labs,Weight trends,Skin,I & O's  REASON FOR ASSESSMENT:   Ventilator    ASSESSMENT:   45 yo male with h/o substance abuse admitted with acute and massive MI cardiac arrest and acute and severe hypoxic resp failure, due to severe ischemic cardiomyopathy and severe systolic CHF  Pt sedated and ventilated. OGT in place to LIS with 55 output. No plans for tube feeds today as there are concerns for aspiration. There is no weight history in chart to determine if any significant recent weight changes.   Medications reviewed and include: aspirin, colace, lasix, insulin, miralax, brilinta, unasyn, pepcid, fentanyl, levophed, propofol   Labs reviewed: K 6.1(H), creat 1.25(H), P 2.6 wnl, Mg 1.7 wnl Wbc- 21.6(H)  Patient is currently intubated on ventilator support MV: 8.8 L/min Temp (24hrs), Avg:98.7 F (37.1 C), Min:93.2 F (34 C), Max:102 F (38.9 C)  Propofol: 14.4 ml/hr- provides 380kcal/day   MAP- >43mmHg  UOP- 77m  NUTRITION - FOCUSED PHYSICAL EXAM:  Flowsheet Row Most Recent Value  Orbital Region No depletion  Upper Arm Region No  depletion  Thoracic and Lumbar Region No depletion  Buccal Region No depletion  Temple Region No depletion  Clavicle Bone Region No depletion  Clavicle and Acromion Bone Region No depletion  Scapular Bone Region No depletion  Dorsal Hand No depletion  Patellar Region Mild depletion  Anterior Thigh Region No depletion  Posterior Calf Region No depletion  Edema (RD Assessment) None  Hair Reviewed  Eyes Reviewed  Mouth Reviewed  Skin Reviewed  Nails Reviewed     Diet Order:   Diet Order            Diet NPO time specified  Diet effective now                EDUCATION NEEDS:   No education needs have been identified at this time  Skin:  Skin Assessment: Reviewed RN Assessment  Last BM:  pta  Height:   Ht Readings from Last 1 Encounters:  09/07/20 5\' 7"  (1.702 m)    Weight:   Wt Readings from Last 1 Encounters:  09/08/20 87.3 kg    Ideal Body Weight:  67.2 kg  BMI:  Body mass index is 30.14 kg/m.  Estimated Nutritional Needs:   Kcal:  2216kcal/day  Protein:  >130g/day  Fluid:  2.0-2.3L/day  MS, RD, LDN Please refer to [facility] for RD and/or RD on-call/weekend/after hours pager

## 2020-09-08 NOTE — Progress Notes (Signed)
*  PRELIMINARY RESULTS* Echocardiogram 2D Echocardiogram has been performed.  Adam Carter 09/08/2020, 11:38 AM

## 2020-09-08 NOTE — Progress Notes (Signed)
GOALS OF CARE DISCUSSION  The Clinical status was relayed to family in detail. Mother at bedside  Updated and notified of patients medical condition.  Patient remains unresponsive and will not open eyes to command.    Patient is having a weak cough and struggling to remove secretions.   Patient with increased WOB and using accessory muscles to breathe Explained to family course of therapy and the modalities    Family understands the situation. Patient remains full code  Family are satisfied with Plan of action and management. All questions answered  Additional CC time 32 mins   Kurian Santiago Glad, M.D.  [facility]  Medical Director [facility] Medical Director [facility] Cardio-Pulmonary Department

## 2020-09-09 ENCOUNTER — Inpatient Hospital Stay: Payer: No Typology Code available for payment source

## 2020-09-09 LAB — CBC WITH DIFFERENTIAL/PLATELET
Abs Immature Granulocytes: 0.09 10*3/uL — ABNORMAL HIGH (ref 0.00–0.07)
Basophils Absolute: 0 10*3/uL (ref 0.0–0.1)
Basophils Relative: 0 %
Eosinophils Absolute: 0 10*3/uL (ref 0.0–0.5)
Eosinophils Relative: 0 %
HCT: 35 % — ABNORMAL LOW (ref 39.0–52.0)
Hemoglobin: 11.5 g/dL — ABNORMAL LOW (ref 13.0–17.0)
Immature Granulocytes: 1 %
Lymphocytes Relative: 6 %
Lymphs Abs: 0.9 10*3/uL (ref 0.7–4.0)
MCH: 31 pg (ref 26.0–34.0)
MCHC: 32.9 g/dL (ref 30.0–36.0)
MCV: 94.3 fL (ref 80.0–100.0)
Monocytes Absolute: 0.7 10*3/uL (ref 0.1–1.0)
Monocytes Relative: 5 %
Neutro Abs: 12.8 10*3/uL — ABNORMAL HIGH (ref 1.7–7.7)
Neutrophils Relative %: 88 %
Platelets: 260 10*3/uL (ref 150–400)
RBC: 3.71 MIL/uL — ABNORMAL LOW (ref 4.22–5.81)
RDW: 12.8 % (ref 11.5–15.5)
WBC: 14.5 10*3/uL — ABNORMAL HIGH (ref 4.0–10.5)
nRBC: 0 % (ref 0.0–0.2)

## 2020-09-09 LAB — BASIC METABOLIC PANEL
Anion gap: 4 — ABNORMAL LOW (ref 5–15)
BUN: 12 mg/dL (ref 6–20)
CO2: 25 mmol/L (ref 22–32)
Calcium: 8.5 mg/dL — ABNORMAL LOW (ref 8.9–10.3)
Chloride: 109 mmol/L (ref 98–111)
Creatinine, Ser: 0.88 mg/dL (ref 0.61–1.24)
GFR, Estimated: >60 mL/min (ref >60)
Glucose, Bld: 173 mg/dL — ABNORMAL HIGH (ref 70–99)
Potassium: 4.4 mmol/L (ref 3.5–5.1)
Sodium: 138 mmol/L (ref 135–145)

## 2020-09-09 LAB — LIPID PANEL
Cholesterol: 126 mg/dL (ref 0–200)
HDL: 34 mg/dL — ABNORMAL LOW (ref >40)
LDL Cholesterol: 75 mg/dL (ref 0–99)
Total CHOL/HDL Ratio: 3.7 RATIO
Triglycerides: 86 mg/dL (ref <150)
VLDL: 17 mg/dL (ref 0–40)

## 2020-09-09 LAB — MAGNESIUM: Magnesium: 1.9 mg/dL (ref 1.7–2.4)

## 2020-09-09 LAB — GLUCOSE, CAPILLARY
Glucose-Capillary: 156 mg/dL — ABNORMAL HIGH (ref 70–99)
Glucose-Capillary: 161 mg/dL — ABNORMAL HIGH (ref 70–99)
Glucose-Capillary: 180 mg/dL — ABNORMAL HIGH (ref 70–99)
Glucose-Capillary: 153 mg/dL — ABNORMAL HIGH (ref 70–99)
Glucose-Capillary: 134 mg/dL — ABNORMAL HIGH (ref 70–99)

## 2020-09-09 LAB — PHOSPHORUS: Phosphorus: 1.8 mg/dL — ABNORMAL LOW (ref 2.5–4.6)

## 2020-09-09 LAB — PROCALCITONIN: Procalcitonin: 0.86 ng/mL

## 2020-09-09 MED ORDER — K PHOS MONO-SOD PHOS DI & MONO 155-852-130 MG PO TABS
250.0000 mg | ORAL_TABLET | Freq: Three times a day (TID) | ORAL | Status: AC
  Administered 2020-09-09 (×3): 250 mg
  Filled 2020-09-09 (×3): qty 1

## 2020-09-09 MED ORDER — DEXMEDETOMIDINE HCL IN NACL 400 MCG/100ML IV SOLN
0.4000 ug/kg/h | INTRAVENOUS | Status: DC
  Administered 2020-09-09 – 2020-09-10 (×2): 0.4 ug/kg/h via INTRAVENOUS
  Filled 2020-09-09: qty 100

## 2020-09-09 MED ORDER — MAGNESIUM SULFATE 2 GM/50ML IV SOLN
2.0000 g | Freq: Once | INTRAVENOUS | Status: AC
  Administered 2020-09-09: 2 g via INTRAVENOUS
  Filled 2020-09-09: qty 50

## 2020-09-09 NOTE — Progress Notes (Signed)
  Chaplain On-Call provided spiritual and emotional support for the patient's mother Jackelyn Poling, whom this Chaplain had met yesterday.  Jackelyn Poling stated her encouragement that the patient seems to be awakening slowly from sedation.  She stated also about her reliance on faith and prayer for coping with this critical situation.  Chaplain Pollyann Samples M.Div., [facility]

## 2020-09-09 NOTE — Progress Notes (Signed)
[place], [place] 09/09/20  Subjective:   Hospital day # 2   cvs: Status post cardiac arrest, STEMI, angioplasty and stent pulm: Ventilator assisted.  FiO2 35% Renal: 03/15 0701 - 03/16 0700 In: 2391.5 [I.V.:1910.6; NG/GT:120; IV Piggyback:360.9] Out: 685 [Urine:635; Emesis/NG output:50] Lab Results  Component Value Date   CREATININE 0.88 09/09/2020   CREATININE 1.25 (H) 09/08/2020   CREATININE 1.43 (H) 09/07/2020     Objective:  Vital signs in last 24 hours:  Temp:  [93.2 F (34 C)-100.4 F (38 C)] 97.88 F (36.6 C) (03/16 0900) Pulse Rate:  [65-98] 68 (03/16 0900) Resp:  [15-23] 18 (03/16 0900) BP: (87-115)/(60-79) 115/68 (03/16 0900) SpO2:  [88 %-97 %] 92 % (03/16 0900) FiO2 (%):  [40 %-45 %] 45 % (03/16 0800) Weight:  [87.7 kg] 87.7 kg (03/16 0500)  Weight change: 7.7 kg Filed Weights   09/07/20 1621 09/08/20 0500 09/09/20 0500  Weight: 80 kg 87.3 kg 87.7 kg    Intake/Output:    Intake/Output Summary (Last 24 hours) at 09/09/2020 0928 Last data filed at 09/09/2020 0962 Gross per 24 hour  Intake 2791.11 ml  Output 2870 ml  Net -78.89 ml    Physical Exam: General:  No acute distress, laying in the bed  HEENT  ETT in place, right conjunctival hemorrhage  Pulm/lungs  ventilator assisted  CVS/Heart  regular rhythm, no rub or gallop  Abdomen:   Soft, nontender  Extremities:  No peripheral edema  Neurologic:  sedated  Skin:  No acute rashes    Basic Metabolic Panel:  Recent Labs  Lab 09/07/20 1541 09/08/20 0131 09/08/20 0615 09/08/20 1243 09/09/20 0445  NA 137 137  --   --  138  K 2.8* 5.7* 6.1* 4.5 4.4  CL 104 109  --   --  109  CO2 15* 23  --   --  25  GLUCOSE 340* 138*  --   --  173*  BUN 10 18  --   --  12  CREATININE 1.43* 1.25*  --   --  0.88  CALCIUM 9.0 8.1*  --   --  8.5*  MG 2.2 1.7  --   --  1.9  PHOS  --  2.6  --   --  1.8*     CBC: Recent Labs  Lab 09/07/20 1541 09/08/20 0131 09/08/20 1402  09/09/20 0445  WBC 17.8* 21.6* 14.6* 14.5*  NEUTROABS 8.9*  --  11.7* 12.8*  HGB 13.3 13.9 12.1* 11.5*  HCT 41.3 41.4 36.0* 35.0*  MCV 96.3 93.2 94.2 94.3  PLT 288 308 273 260     No results found for: HEPBSAG, HEPBSAB, HEPBIGM    Microbiology:  Recent Results (from the past 240 hour(s))  Resp Panel by RT-PCR (Flu A&B, Covid) Nasopharyngeal Swab     Status: None   Collection Time: 09/07/20  3:41 PM   Specimen: Nasopharyngeal Swab; Nasopharyngeal(NP) swabs in vial transport medium  Result Value Ref Range Status   SARS Coronavirus 2 by RT PCR NEGATIVE NEGATIVE Final    Comment: (NOTE) SARS-CoV-2 target nucleic acids are NOT DETECTED.  The SARS-CoV-2 RNA is generally detectable in upper respiratory specimens during the acute phase of infection. The lowest concentration of SARS-CoV-2 viral copies this assay can detect is 138 copies/mL. A negative result does not preclude SARS-Cov-2 infection and should not be used as the sole basis for treatment or other patient management decisions. A negative result may occur with  improper specimen  collection/handling, submission of specimen other than nasopharyngeal swab, presence of viral mutation(s) within the areas targeted by this assay, and inadequate number of viral copies(<138 copies/mL). A negative result must be combined with clinical observations, patient history, and epidemiological information. The expected result is Negative.  Fact Sheet for Patients:  EntrepreneurPulse.com.au  Fact Sheet for Healthcare Providers:  IncredibleEmployment.be  This test is no t yet approved or cleared by the [place] FDA and  has been authorized for detection and/or diagnosis of SARS-CoV-2 by FDA under an Emergency Use Authorization (EUA). This EUA will remain  in effect (meaning this test can be used) for the duration of the COVID-19 declaration under Section 564(b)(1) of the Act, 21 U.S.C.section  360bbb-3(b)(1), unless the authorization is terminated  or revoked sooner.       Influenza A by PCR NEGATIVE NEGATIVE Final   Influenza B by PCR NEGATIVE NEGATIVE Final    Comment: (NOTE) The Xpert Xpress SARS-CoV-2/FLU/RSV plus assay is intended as an aid in the diagnosis of influenza from Nasopharyngeal swab specimens and should not be used as a sole basis for treatment. Nasal washings and aspirates are unacceptable for Xpert Xpress SARS-CoV-2/FLU/RSV testing.  Fact Sheet for Patients: EntrepreneurPulse.com.au  Fact Sheet for Healthcare Providers: IncredibleEmployment.be  This test is not yet approved or cleared by the [place] FDA and has been authorized for detection and/or diagnosis of SARS-CoV-2 by FDA under an Emergency Use Authorization (EUA). This EUA will remain in effect (meaning this test can be used) for the duration of the COVID-19 declaration under Section 564(b)(1) of the Act, 21 U.S.C. section 360bbb-3(b)(1), unless the authorization is terminated or revoked.  Performed at [facility], [address] [place], [place] [address]   MRSA PCR Screening     Status: None   Collection Time: 09/07/20  6:32 PM   Specimen: Nasopharyngeal Swab  Result Value Ref Range Status   MRSA by PCR NEGATIVE NEGATIVE Final    Comment:        The GeneXpert MRSA Assay (FDA approved for NASAL specimens only), is one component of a comprehensive MRSA colonization surveillance program. It is not intended to diagnose MRSA infection nor to guide or monitor treatment for MRSA infections. Performed at [facility], [address] [place], [place] [address]   CULTURE, BLOOD (ROUTINE X 2) w Reflex to ID Panel     Status: None (Preliminary result)   Collection Time: 09/08/20  1:31 AM   Specimen: BLOOD  Result Value Ref Range Status   Specimen Description BLOOD BLOOD LEFT FOREARM  Final   Special Requests   Final     BOTTLES DRAWN AEROBIC AND ANAEROBIC Blood Culture adequate volume   Culture   Final    NO GROWTH 1 DAY Performed at [facility], [address], [place], [place] [address]    Report Status PENDING  Incomplete  CULTURE, BLOOD (ROUTINE X 2) w Reflex to ID Panel     Status: None (Preliminary result)   Collection Time: 09/08/20  1:31 AM   Specimen: BLOOD  Result Value Ref Range Status   Specimen Description BLOOD BLOOD LEFT HAND  Final   Special Requests   Final    BOTTLES DRAWN AEROBIC AND ANAEROBIC Blood Culture results may not be optimal due to an inadequate volume of blood received in culture bottles   Culture   Final    NO GROWTH 1 DAY Performed at [facility], [address] [place], [place] [address]  Report Status PENDING  Incomplete  Culture, Respiratory w Gram Stain     Status: None (Preliminary result)   Collection Time: 09/08/20  5:38 AM   Specimen: Tracheal Aspirate; Respiratory  Result Value Ref Range Status   Specimen Description   Final    TRACHEAL ASPIRATE Performed at [facility], [address] [place], [place] [address]    Special Requests   Final    NONE Performed at [address] [place], [place] [address]    Gram Stain   Final    ABUNDANT WBC PRESENT,BOTH PMN AND MONONUCLEAR MODERATE GRAM POSITIVE COCCI IN PAIRS IN CHAINS RARE GRAM NEGATIVE COCCOBACILLI RARE BUDDING YEAST SEEN Performed at [facility], [place] [address] [place], [place] [address]    Culture PENDING  Incomplete   Report Status PENDING  Incomplete    Coagulation Studies: Recent Labs    09/08/20 1402  LABPROT 15.2  INR 1.2    Urinalysis: Recent Labs    09/08/20 1402  COLORURINE YELLOW*  LABSPEC 1.034*  PHURINE 5.0  GLUCOSEU NEGATIVE  HGBUR MODERATE*  BILIRUBINUR NEGATIVE  KETONESUR 5*  PROTEINUR 30*  NITRITE NEGATIVE  LEUKOCYTESUR NEGATIVE      Imaging: DG Abd 1 View  Result Date:  09/08/2020 CLINICAL DATA:  Intubated, enteric catheter placement EXAM: ABDOMEN - 1 VIEW COMPARISON:  None. FINDINGS: Single frontal view of the upper abdomen was obtained. The right flank, hemidiaphragms, and pelvis are excluded by collimation. Enteric catheter tip and side port project over the gastric body. External defibrillator pads overlie the lower chest. Bowel gas pattern is unremarkable. IMPRESSION: 1. Enteric catheter overlying the gastric body. Electronically Signed   By: Randa Ngo M.D.   On: 09/08/2020 01:22   CARDIAC CATHETERIZATION  Result Date: 09/07/2020  The left ventricular ejection fraction is 25-35% by visual estimate.  There is moderate to severe left ventricular systolic dysfunction.  LV end diastolic pressure is moderately elevated.  Ost LAD to Prox LAD lesion is 100% stenosed.  Post intervention, there is a 0% residual stenosis.  A drug-eluting stent was successfully placed using a STENT RESOLUTE ONYX 3.5X12.  Mid LAD lesion is 30% stenosed.  1.  Out of hospital cardiac arrest with anterior ST elevation myocardial infarction due to ostial occlusion of the LAD.  No other obstructive disease. 2.  Moderately severely reduced LV systolic function with an EF of 25 to 30% with severe anterior/apical hypokinesis. 3.  Successful angioplasty and drug-eluting stent placement to the ostial LAD. 4.  Right heart catheterization was performed after PCI and showed mildly elevated filling pressures, mild pulmonary hypertension and normal cardiac output.  PA sat was 78.7%.  Fick cardiac output was 6.17 with a cardiac index of 3.5.  CPO was 1.4.  Based on these hemodynamic numbers, no hemodynamic support was utilized. Recommendations: Dual antiplatelet therapy for at least 1 year. High-dose atorvastatin. We will run Aggrastat infusion for 12 hours given uncertainty of oral absorption of antiplatelet medications. The patient had significant agitation and this will need to be managed.  He is  currently on propofol drip and fentanyl drip.  I discussed with Dr. Mortimer Fries. Recommend electrolyte replacement. Critical care time of 45 minutes managing abnormal hemodynamics, ventilator management and electrolytes.   US RENAL  Result Date: 09/08/2020 CLINICAL DATA:  Acute renal failure. EXAM: RENAL / URINARY TRACT ULTRASOUND COMPLETE COMPARISON:  None. FINDINGS: Right Kidney: Renal measurements: 10.9 cm x 5.6 cm x 6.2 cm = volume: 198  mL. Echogenicity within normal limits. No mass or hydronephrosis visualized. Left Kidney: Renal measurements: 12.0 cm x 5.0 cm x 5.1 cm = volume: 160 mL. Echogenicity within normal limits. No mass or hydronephrosis visualized. Bladder: Appears normal for degree of bladder distention. Other: None. IMPRESSION: Normal renal ultrasound. Electronically Signed   By: Virgina Norfolk M.D.   On: 09/08/2020 19:54   DG Chest Port 1 View  Result Date: 09/08/2020 CLINICAL DATA:  Central line placement. collapsed on side of the road unresponsive and pulseless per ED note. EXAM: PORTABLE CHEST 1 VIEW COMPARISON:  Chest x-ray 09/08/2020 12:59 a.m. FINDINGS: Interval retraction of an endotracheal tube with tip terminating 2.5-3 cm above the carina. Enteric tube coursing below the hemidiaphragm with side port overlying the expected region of the gastric lumen and tip collimated off view. Interval placement of a left internal central jugular venous catheter with tip overlying the expected region of the superior cavoatrial junction. The heart size and mediastinal contours are unchanged. Low lung volumes with hilar and bibasilar patchy airspace opacities. Likely trace bilateral pole effusions. No pneumothorax. No acute osseous abnormality. IMPRESSION: 1. Low lung volumes with hilar and bibasilar patchy airspace opacities that could represent a combination of edema and infection/inflammation. 2. Likely trace bilateral pleural effusions. 3. Endotracheal tube, enteric tube, left internal central  jugular venous catheter in appropriate position. Electronically Signed   By: Iven Finn M.D.   On: 09/08/2020 06:41   DG Chest Port 1 View  Result Date: 09/08/2020 CLINICAL DATA:  Intubated, cardiac arrest EXAM: PORTABLE CHEST 1 VIEW COMPARISON:  None. FINDINGS: Single frontal view of the chest demonstrates endotracheal tube overlying tracheal air column tip approximately 1.6 cm above carina. Enteric catheter passes below diaphragm tip excluded by collimation, side port projecting over gastric body. External defibrillator pads overlie the heart. Cardiac silhouette is enlarged. There is increased central vascular congestion with bibasilar consolidation and effusions. No pneumothorax. No acute bony abnormalities. IMPRESSION: 1. Support devices as above. 2. Pulmonary edema, with bilateral pleural effusions. Electronically Signed   By: Randa Ngo M.D.   On: 09/08/2020 01:21   ECHOCARDIOGRAM COMPLETE  Result Date: 09/08/2020    ECHOCARDIOGRAM REPORT   Patient Name:   Adam Carter Date of Exam: 09/08/2020 Medical Rec #:  [id number]   Height:       67.0 in Accession #:    [id number]  Weight:       192.5 lb Date of Birth:  10-11-1975   BSA:          1.989 m Patient Age:    89 years    BP:           88/65 mmHg Patient Gender: M           HR:           85 bpm. Exam Location:  [facility] Procedure: 2D Echo, Cardiac Doppler, Color Doppler and Strain Analysis Indications:     Acute myocardial infarction I21.9  History:         Patient has no prior history of Echocardiogram examinations. No                  medical history on file.  Sonographer:     Sherrie Sport RDCS (AE) Referring Phys:  [place] Diagnosing Phys: Kathlyn Sacramento MD  Sonographer Comments: Global longitudinal strain was attempted. IMPRESSIONS  1. Left ventricular ejection fraction, by estimation, is 30 to 35%. The left ventricle has moderately decreased function. The  left ventricle demonstrates regional wall motion abnormalities (see scoring  diagram/findings for description). There is mild left ventricular hypertrophy. Left ventricular diastolic parameters were normal. There is moderate hypokinesis of the left ventricular, mid-apical anteroseptal wall, anterior wall and anterolateral wall. The average left ventricular global longitudinal strain is -5.3 %. The global longitudinal strain is abnormal.  2. Right ventricular systolic function is normal. The right ventricular size is normal. Tricuspid regurgitation signal is inadequate for assessing PA pressure.  3. The mitral valve is normal in structure. No evidence of mitral valve regurgitation. No evidence of mitral stenosis.  4. The aortic valve is normal in structure. Aortic valve regurgitation is not visualized. No aortic stenosis is present. FINDINGS  Left Ventricle: Left ventricular ejection fraction, by estimation, is 30 to 35%. The left ventricle has moderately decreased function. The left ventricle demonstrates regional wall motion abnormalities. Moderate hypokinesis of the left ventricular, mid-apical anteroseptal wall, anterior wall and anterolateral wall. The average left ventricular global longitudinal strain is -5.3 %. The global longitudinal strain is abnormal. The left ventricular internal cavity size was normal in size. There is mild  left ventricular hypertrophy. Left ventricular diastolic parameters were normal. Right Ventricle: The right ventricular size is normal. No increase in right ventricular wall thickness. Right ventricular systolic function is normal. Tricuspid regurgitation signal is inadequate for assessing PA pressure. Left Atrium: Left atrial size was normal in size. Right Atrium: Right atrial size was normal in size. Pericardium: There is no evidence of pericardial effusion. Mitral Valve: The mitral valve is normal in structure. No evidence of mitral valve regurgitation. No evidence of mitral valve stenosis. Tricuspid Valve: The tricuspid valve is normal in structure.  Tricuspid valve regurgitation is not demonstrated. No evidence of tricuspid stenosis. Aortic Valve: The aortic valve is normal in structure. Aortic valve regurgitation is not visualized. No aortic stenosis is present. Aortic valve mean gradient measures 2.0 mmHg. Aortic valve peak gradient measures 3.7 mmHg. Aortic valve area, by VTI measures 3.00 cm. Pulmonic Valve: The pulmonic valve was normal in structure. Pulmonic valve regurgitation is not visualized. No evidence of pulmonic stenosis. Aorta: The aortic root is normal in size and structure. Venous: The inferior vena cava was not well visualized. IAS/Shunts: No atrial level shunt detected by color flow Doppler.  LEFT VENTRICLE PLAX 2D LVIDd:         3.97 cm     Diastology LVIDs:         3.18 cm     LV e' medial:    6.42 cm/s LV PW:         1.16 cm     LV E/e' medial:  9.3 LV IVS:        0.95 cm     LV e' lateral:   7.72 cm/s LVOT diam:     2.00 cm     LV E/e' lateral: 7.7 LV SV:         41 LV SV Index:   21          2D Longitudinal Strain LVOT Area:     3.14 cm    2D Strain GLS Avg:     -5.3 %  LV Volumes (MOD) LV vol d, MOD A2C: 81.9 ml LV vol d, MOD A4C: 77.7 ml LV vol s, MOD A2C: 47.6 ml LV vol s, MOD A4C: 53.7 ml LV SV MOD A2C:     34.3 ml LV SV MOD A4C:     77.7 ml LV SV MOD BP:  26.8 ml RIGHT VENTRICLE RV Basal diam:  3.17 cm RV S prime:     13.20 cm/s TAPSE (M-mode): 3.3 cm LEFT ATRIUM             Index       RIGHT ATRIUM           Index LA diam:        3.30 cm 1.66 cm/m  RA Area:     11.30 cm LA Vol (A2C):   14.7 ml 7.39 ml/m  RA Volume:   24.00 ml  12.06 ml/m LA Vol (A4C):   21.1 ml 10.61 ml/m LA Biplane Vol: 17.6 ml 8.85 ml/m  AORTIC VALVE                   PULMONIC VALVE AV Area (Vmax):    2.82 cm    PV Vmax:        0.78 m/s AV Area (Vmean):   2.57 cm    PV Peak grad:   2.4 mmHg AV Area (VTI):     3.00 cm    RVOT Peak grad: 4 mmHg AV Vmax:           96.10 cm/s AV Vmean:          68.200 cm/s AV VTI:            0.136 m AV Peak Grad:       3.7 mmHg AV Mean Grad:      2.0 mmHg LVOT Vmax:         86.20 cm/s LVOT Vmean:        55.800 cm/s LVOT VTI:          0.130 m LVOT/AV VTI ratio: 0.96  AORTA Ao Root diam: 3.10 cm MITRAL VALVE               TRICUSPID VALVE MV Area (PHT): 2.92 cm    TR Peak grad:   7.8 mmHg MV Decel Time: 260 msec    TR Vmax:        140.00 cm/s MV E velocity: 59.60 cm/s MV A velocity: 59.60 cm/s  SHUNTS MV E/A ratio:  1.00        Systemic VTI:  0.13 m                            Systemic Diam: 2.00 cm Kathlyn Sacramento MD Electronically signed by Kathlyn Sacramento MD Signature Date/Time: 09/08/2020/4:58:17 PM    Final      Medications:   . sodium chloride    . sodium chloride    . sodium chloride 20 mL/hr at 09/09/20 0906  . ampicillin-sulbactam (UNASYN) IV Stopped (09/09/20 0844)  . famotidine (PEPCID) IV Stopped (09/08/20 2001)  . fentaNYL infusion INTRAVENOUS 125 mcg/hr (09/09/20 0906)  . norepinephrine (LEVOPHED) Adult infusion 9 mcg/min (09/09/20 0906)  . propofol (DIPRIVAN) infusion 40 mcg/kg/min (09/09/20 0906)   . aspirin  81 mg Per Tube Daily  . atorvastatin  80 mg Per Tube q1800  . chlorhexidine gluconate (MEDLINE KIT)  15 mL Mouth Rinse BID  . Chlorhexidine Gluconate Cloth  6 each Topical Daily  . docusate  100 mg Per Tube BID  . insulin aspart  0-9 Units Subcutaneous Q4H  . mouth rinse  15 mL Mouth Rinse 10 times per day  . methylPREDNISolone (SOLU-MEDROL) injection  20 mg Intravenous Q12H  . phosphorus  250 mg Per Tube Q8H  . polyethylene glycol  17 g Per Tube Daily  . sodium chloride flush  3 mL Intravenous Q12H  . sodium chloride flush  3 mL Intravenous Q12H  . ticagrelor  90 mg Per Tube BID   sodium chloride, sodium chloride, acetaminophen, docusate, fentaNYL, midazolam, ondansetron (ZOFRAN) IV, polyethylene glycol, sodium chloride flush, sodium chloride flush  Assessment/ Plan:  45 y.o. male with unknown past medical history, came in with cardiac arrest, diagnosed with STEMI, LAD occlusion,  drug-eluting stent and angioplasty on March 14, mild pulmonary hypertension noted  admitted on 09/07/2020 for Cardiac arrest [facility]) [I46.9] ST elevation myocardial infarction (STEMI), unspecified artery ([place]) [I21.3]  #Acute kidney injury #Hyperkalemia Baseline creatinine is unknown Urinalysis with 11-20 RBCs, urine protein to creatinine ratio 0.24 Renal ultrasound-normal-sized kidney.  No lesions  Serum creatinine and potassium levels are now normal We will sign off Please reconsult as necessary      LOS: 2 Adam Carter 3/16/20229:28 AM  [place], [place]  Note: This note was prepared with Dragon dictation. Any transcription errors are unintentional

## 2020-09-09 NOTE — Progress Notes (Signed)
GOALS OF CARE DISCUSSION  The Clinical status was relayed to family in detail. Mother at bedside  Updated and notified of patients medical condition.  Patient remains unresponsive and will not open eyes to command.    Family understands the situation.  Patient remains Full Code Plan for SAT/SBT today and hope for trial of extubation  Family are satisfied with Plan of action and management. All questions answered  Additional CC time 32 mins   Jahziah Simonin Santiago Glad, M.D.  [facility]  Medical Director [facility] Medical Director [facility] Cardio-Pulmonary Department

## 2020-09-09 NOTE — Progress Notes (Signed)
NAME:  Adam Carter, MRN:  [id number], DOB:  August 19, 1975, LOS: 2 ADMISSION DATE:  09/07/2020,   REFERRING MD: Raliegh Ip  CHIEF COMPLAINT:  Cardiac arrest  Brief History:  45 yo male with acute and massive MI cardiac arrest and acute and severe hypoxic resp failure, due to severe ischemic cardiomyopathy and severe systolic CHF  History of Present Illness:  45 y.o.malewith unknown PMH who presents after cardiac arrest. 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  He then said "oh shit" and his coworkers witnessed him collapse.  He was found to not have a pulse and CPR was initiated by bystanders.  When EMS arrived, the patient was found to be in ventricular fibrillation.  He received 2 shocks and a single dose of epinephrine and had ROSC. EKG postarrest showed evidence of STEMI.   Patient had king airway placed and replaced with ETT CODE STEMI called and patient taken to cath lab  Admitted to ICU for cardiac arrest/resp failure  Past Medical History:       Active Ambulatory Problems    Diagnosis Date Noted  . No Active Ambulatory Problems       Resolved Ambulatory Problems    Diagnosis Date Noted  . No Resolved Ambulatory Problems   No Additional Past Medical History     Significant Hospital Events:  3/14 acute MI s/p cardiac arrest s/p CATH 3/15 remains critically ill 3/16 remains critically ill  Consults:  CARDIOLOGY  PCCM  Procedures:  3/14 CATH-left ventricular ejection fraction is 25-35% by visual estimate.  There is moderate to severe left ventricular systolic dysfunction.  LV end diastolic pressure is moderately elevated.  Ost LAD to Prox LAD lesion is 100% stenosed.  Post intervention, there is a 0% residual stenosis.  A drug-eluting stent was successfully placed using a STENT RESOLUTE ONYX 3.5X12.  Mid LAD lesion is 30% stenosed.  Micro Data:  VIRAL PANEL NEG COVID NEG     Interim History / Subjective:   Remains intubated,sedated Severe resp failure multiorgan failure   Objective   Blood pressure 105/66, pulse 68, temperature 97.88 F (36.6 C), resp. rate 18, height _0  (1.702 m), weight 87.7 kg, SpO2 92 %. CVP:  [5 mmHg-13 mmHg] 6 mmHg  Vent Mode: PRVC FiO2 (%):  [40 %-55 %] 45 % Set Rate:  [18 bmp] 18 bmp Vt Set:  [500 mL] 500 mL PEEP:  [5 cmH20] 5 cmH20 Plateau Pressure:  [20 cmH20] 20 cmH20   Intake/Output Summary (Last 24 hours) at 09/09/2020 0750 Last data filed at 09/09/2020 0551 Gross per 24 hour  Intake 2391.54 ml  Output 685 ml  Net 1706.54 ml   Filed Weights   09/07/20 1621 09/08/20 0500 09/09/20 0500  Weight: 80 kg 87.3 kg 87.7 kg    REVIEW OF SYSTEMS  PATIENT IS UNABLE TO PROVIDE COMPLETE REVIEW OF SYSTEM S DUE TO SEVERE CRITICAL ILLNESS AND ENCEPHALOPATHY   PHYSICAL EXAMINATION:  GENERAL:critically ill appearing, +resp distress HEAD: Normocephalic, atraumatic.  EYES: Pupils equal, round, reactive to light.  No scleral icterus.  MOUTH: Moist mucosal membrane. NECK: Supple. No thyromegaly. No nodules. No JVD.  PULMONARY: +rhonchi, +wheezing CARDIOVASCULAR: S1 and S2. Regular rate and rhythm. No murmurs, rubs, or gallops.  GASTROINTESTINAL: Soft, nontender, -distended. Positive bowel sounds.  MUSCULOSKELETAL: No swelling, clubbing, or edema.  NEUROLOGIC: obtunded SKIN:intact,warm,dry     Assessment & Plan:  45 yo male with acute and massive MI cardiac arrest and acute and severe  hypoxic resp failure, due to severe ischemic cardiomyopathy and severe systolic CHF, possible poly substance abuse amphetamines on drug screen  At this time, patient had immediate CPR and lasted 5-7 mins based on report and was very agitated and trying to pull out ETT, patient was given propofol for sedation, at this time, Hypothermia Protocol may NOT be beneficial in this case     Severe ACUTE Hypoxic and Hypercapnic Respiratory Failure -continue Mechanical Ventilator  support -continue Bronchodilator Therapy -Wean Fio2 and PEEP as tolerated -VAP/VENT bundle implementation -will perform SAT/SBT when respiratory parameters are met   ACUTE SYSTOLIC CARDIAC FAILURE- EF 25% -oxygen as needed -Lasix as tolerated -follow up cardiac enzymes as indicated -follow up cardiology recs  ACUTE KIDNEY INJURY/Renal Failure -continue Foley Catheter-assess need -Avoid nephrotoxic agents -Follow urine output, BMP -Ensure adequate renal perfusion, optimize oxygenation -Renal dose medications  ACUTE MI/ACUTE Systolic CHF CARDIOGENIC SHOCK  GI GI PROPHYLAXIS as indicated  NUTRITIONAL STATUS DIET-->TF's as tolerated Constipation protocol as indicated  ENDO - ICU hypoglycemic\Hyperglycemia protocol -check FSBS per protocol  ACUTE ANEMIA- TRANSFUSE AS NEEDED DVT PRX with TED/SCD's ONLY TRANSFUSE if HGB<7     DVT/GI PRX ordered and assessed TRANSFUSIONS AS NEEDED MONITOR FSBS I Assessed the need for Labs I Assessed the need for Foley I Assessed the need for Central Venous Line Family Discussion when available I Assessed the need for Mobilization I made an Assessment of medications to be adjusted accordingly Safety Risk assessment completed  CASE DISCUSSED IN MULTIDISCIPLINARY ROUNDS WITH ICU TEAM      Labs   CBC: Recent Labs  Lab 09/07/20 1541 09/08/20 0131 09/08/20 1402 09/09/20 0445  WBC 17.8* 21.6* 14.6* 14.5*  NEUTROABS 8.9*  --  11.7* 12.8*  HGB 13.3 13.9 12.1* 11.5*  HCT 41.3 41.4 36.0* 35.0*  MCV 96.3 93.2 94.2 94.3  PLT 288 308 273 253    Basic Metabolic Panel: Recent Labs  Lab 09/07/20 1541 09/08/20 0131 09/08/20 0615 09/08/20 1243 09/09/20 0445  NA 137 137  --   --  138  K 2.8* 5.7* 6.1* 4.5 4.4  CL 104 109  --   --  109  CO2 15* 23  --   --  25  GLUCOSE 340* 138*  --   --  173*  BUN 10 18  --   --  12  CREATININE 1.43* 1.25*  --   --  0.88  CALCIUM 9.0 8.1*  --   --  8.5*  MG 2.2 1.7  --   --  1.9  PHOS   --  2.6  --   --  1.8*   GFR: Estimated Creatinine Clearance: 114.4 mL/min (by C-G formula based on SCr of 0.88 mg/dL). Recent Labs  Lab 09/07/20 1541 09/08/20 0131 09/08/20 0615 09/08/20 1243 09/08/20 1402 09/09/20 0445  PROCALCITON  --  1.62  --  1.37  --  0.86  WBC 17.8* 21.6*  --   --  14.6* 14.5*  LATICACIDVEN  --  1.6 1.3  --   --   --     Liver Function Tests: Recent Labs  Lab 09/07/20 1541 09/08/20 0131  AST 150* 291*  ALT 130* 155*  ALKPHOS 90 87  BILITOT 0.8 0.9  PROT 6.8 6.4*  ALBUMIN 3.7 3.5   No results for input(s): LIPASE, AMYLASE in the last 168 hours. No results for input(s): AMMONIA in the last 168 hours.  ABG    Component Value Date/Time   PHART 7.33 (L) 09/08/2020  0500   PCO2ART 43 09/08/2020 0500   PO2ART 129 (H) 09/08/2020 0500   HCO3 22.7 09/08/2020 0500   ACIDBASEDEF 3.2 (H) 09/08/2020 0500   O2SAT 98.7 09/08/2020 0500     Coagulation Profile: Recent Labs  Lab 09/08/20 1402  INR 1.2    Cardiac Enzymes: No results for input(s): CKTOTAL, CKMB, CKMBINDEX, TROPONINI in the last 168 hours.  HbA1C: Hgb A1c MFr Bld  Date/Time Value Ref Range Status  09/08/2020 01:30 AM 6.0 (H) 4.8 - 5.6 % Final    Comment:    (NOTE) Pre diabetes:          5.7%-6.4%  Diabetes:              >6.4%  Glycemic control for   <7.0% adults with diabetes     CBG: Recent Labs  Lab 09/08/20 1201 09/08/20 1633 09/08/20 1942 09/08/20 2335 09/09/20 0327  GLUCAP 143* 161* 159* 171* 156*    Allergies Not on File   Home Medications  Prior to Admission medications   Medication Sig Start Date End Date Taking? Authorizing Provider  pregabalin (LYRICA) 75 MG capsule Take 75 mg by mouth 3 (three) times daily.   Yes [provider]      Critical Care Time devoted to patient care services described in this note is 65 minutes.   Overall, patient is critically ill, prognosis is guarded.  Patient with Multiorgan failure and at high risk for  cardiac arrest and death.    Corrin Parker, M.D.  [facility]  Medical Director [place] [facility] Cardio-Pulmonary Department

## 2020-09-09 NOTE — Progress Notes (Signed)
Pt sedation weaned this am and then turned off. Pt awake, opening eyes, moving all extremities, not following commands. Became agitated, trying to get out of bed and gagging. Sedation restarted at lower dose. RT and Jeri Modena at bedside at this time, pt started on SBT with pressure support of 5. Precedex gtt also started at this time. Pt able to breathe on spontaneous for approx 20 minutes, then once again became very agitated, trying to get out of bed, not redirectable. Also was desaturating this time. Pt re-sedated after discussion with Dr.Kasa and Jeri Modena. Plan to keep intubated today with prop and fent for sedation, restart precedex approx 6am tmrw in preparation for sedation awakening and SBT tomorrow.

## 2020-09-09 NOTE — Progress Notes (Signed)
Progress Note  Patient Name: Adam Carter Date of Encounter: 09/09/2020  Primary Cardiologist: Kathlyn Sacramento, MD  Subjective   Remains intubated/sedated.  Still on norepi.  Mother @ bedside.  Inpatient Medications    Scheduled Meds: . aspirin  81 mg Per Tube Daily  . atorvastatin  80 mg Per Tube q1800  . chlorhexidine gluconate (MEDLINE KIT)  15 mL Mouth Rinse BID  . Chlorhexidine Gluconate Cloth  6 each Topical Daily  . docusate  100 mg Per Tube BID  . insulin aspart  0-9 Units Subcutaneous Q4H  . mouth rinse  15 mL Mouth Rinse 10 times per day  . methylPREDNISolone (SOLU-MEDROL) injection  20 mg Intravenous Q12H  . phosphorus  250 mg Per Tube Q8H  . polyethylene glycol  17 g Per Tube Daily  . sodium chloride flush  3 mL Intravenous Q12H  . sodium chloride flush  3 mL Intravenous Q12H  . ticagrelor  90 mg Per Tube BID   Continuous Infusions: . sodium chloride    . sodium chloride    . sodium chloride 20 mL/hr at 09/09/20 0906  . ampicillin-sulbactam (UNASYN) IV Stopped (09/09/20 0844)  . famotidine (PEPCID) IV Stopped (09/08/20 2001)  . fentaNYL infusion INTRAVENOUS 125 mcg/hr (09/09/20 0906)  . norepinephrine (LEVOPHED) Adult infusion 9 mcg/min (09/09/20 0906)  . propofol (DIPRIVAN) infusion 40 mcg/kg/min (09/09/20 0906)   PRN Meds: sodium chloride, sodium chloride, acetaminophen, docusate, fentaNYL, midazolam, ondansetron (ZOFRAN) IV, polyethylene glycol, sodium chloride flush, sodium chloride flush   Vital Signs    Vitals:   09/09/20 0800 09/09/20 0833 09/09/20 0849 09/09/20 0900  BP: 105/67   115/68  Pulse: 66 66 87 68  Resp: _0 Temp: 97.88 F (36.6 C) 97.88 F (36.6 C) 97.88 F (36.6 C) 97.88 F (36.6 C)  TempSrc: Bladder     SpO2: 92% 93% 93% 92%  Weight:      Height:        Intake/Output Summary (Last 24 hours) at 09/09/2020 0914 Last data filed at 09/09/2020 0906 Gross per 24 hour  Intake 2791.11 ml  Output 2870 ml  Net -78.89 ml    Filed Weights   09/07/20 1621 09/08/20 0500 09/09/20 0500  Weight: 80 kg 87.3 kg 87.7 kg    Physical Exam   GEN: Well nourished, well developed. Intubated/sedated. HEENT: Grossly normal. ET tube and support apparatus in place. Neck: Supple, no JVD, carotid bruits, or masses. Cardiac: RRR, no murmurs, rubs, or gallops. No clubbing, cyanosis, edema.  Radials 2+, DP/PT 2+ and equal bilaterally.  Respiratory:  Respirations regular and unlabored, clear to auscultation bilaterally. GI: Soft, nontender, nondistended, BS + x 4. MS: no deformity or atrophy. Skin: warm and dry, no rash. Neuro:  Sedated - unresponsive. Psych: Sedated - unresponsive.  Labs    Chemistry Recent Labs  Lab 09/07/20 1541 09/08/20 0131 09/08/20 0615 09/08/20 1243 09/09/20 0445  NA 137 137  --   --  138  K 2.8* 5.7* 6.1* 4.5 4.4  CL 104 109  --   --  109  CO2 15* 23  --   --  25  GLUCOSE 340* 138*  --   --  173*  BUN 10 18  --   --  12  CREATININE 1.43* 1.25*  --   --  0.88  CALCIUM 9.0 8.1*  --   --  8.5*  PROT 6.8 6.4*  --   --   --   ALBUMIN 3.7  3.5  --   --   --   AST 150* 291*  --   --   --   ALT 130* 155*  --   --   --   ALKPHOS 90 87  --   --   --   BILITOT 0.8 0.9  --   --   --   GFRNONAA >60 >60  --   --  >60  ANIONGAP 18* 5  --   --  4*     Hematology Recent Labs  Lab 09/08/20 0131 09/08/20 1402 09/09/20 0445  WBC 21.6* 14.6* 14.5*  RBC 4.44 3.82* 3.71*  HGB 13.9 12.1* 11.5*  HCT 41.4 36.0* 35.0*  MCV 93.2 94.2 94.3  MCH 31.3 31.7 31.0  MCHC 33.6 33.6 32.9  RDW 12.7 12.9 12.8  PLT 308 273 260    Cardiac Enzymes  Recent Labs  Lab 09/07/20 1541 09/07/20 1842 09/08/20 0130  TROPONINIHS 3,635* >27,000* >27,000*       Lipids  Lab Results  Component Value Date   CHOL 126 09/09/2020   HDL 34 (L) 09/09/2020   LDLCALC 75 09/09/2020   TRIG 86 09/09/2020   CHOLHDL 3.7 09/09/2020    HbA1c  Lab Results  Component Value Date   HGBA1C 6.0 (H) 09/08/2020     Radiology    DG Abd 1 View  Result Date: 09/08/2020 CLINICAL DATA:  Intubated, enteric catheter placement EXAM: ABDOMEN - 1 VIEW COMPARISON:  None. FINDINGS: Single frontal view of the upper abdomen was obtained. The right flank, hemidiaphragms, and pelvis are excluded by collimation. Enteric catheter tip and side port project over the gastric body. External defibrillator pads overlie the lower chest. Bowel gas pattern is unremarkable. IMPRESSION: 1. Enteric catheter overlying the gastric body. Electronically Signed   By: Randa Ngo M.D.   On: 09/08/2020 01:22   US RENAL  Result Date: 09/08/2020 CLINICAL DATA:  Acute renal failure. EXAM: RENAL / URINARY TRACT ULTRASOUND COMPLETE COMPARISON:  None. FINDINGS: Right Kidney: Renal measurements: 10.9 cm x 5.6 cm x 6.2 cm = volume: 198 mL. Echogenicity within normal limits. No mass or hydronephrosis visualized. Left Kidney: Renal measurements: 12.0 cm x 5.0 cm x 5.1 cm = volume: 160 mL. Echogenicity within normal limits. No mass or hydronephrosis visualized. Bladder: Appears normal for degree of bladder distention. Other: None. IMPRESSION: Normal renal ultrasound. Electronically Signed   By: Virgina Norfolk M.D.   On: 09/08/2020 19:54   DG Chest Port 1 View  Result Date: 09/08/2020 CLINICAL DATA:  Central line placement. collapsed on side of the road unresponsive and pulseless per ED note. EXAM: PORTABLE CHEST 1 VIEW COMPARISON:  Chest x-ray 09/08/2020 12:59 a.m. FINDINGS: Interval retraction of an endotracheal tube with tip terminating 2.5-3 cm above the carina. Enteric tube coursing below the hemidiaphragm with side port overlying the expected region of the gastric lumen and tip collimated off view. Interval placement of a left internal central jugular venous catheter with tip overlying the expected region of the superior cavoatrial junction. The heart size and mediastinal contours are unchanged. Low lung volumes with hilar and bibasilar patchy  airspace opacities. Likely trace bilateral pole effusions. No pneumothorax. No acute osseous abnormality. IMPRESSION: 1. Low lung volumes with hilar and bibasilar patchy airspace opacities that could represent a combination of edema and infection/inflammation. 2. Likely trace bilateral pleural effusions. 3. Endotracheal tube, enteric tube, left internal central jugular venous catheter in appropriate position. Electronically Signed   By: Clelia Croft.D.  On: 09/08/2020 06:41   DG Chest Port 1 View  Result Date: 09/08/2020 CLINICAL DATA:  Intubated, cardiac arrest EXAM: PORTABLE CHEST 1 VIEW COMPARISON:  None. FINDINGS: Single frontal view of the chest demonstrates endotracheal tube overlying tracheal air column tip approximately 1.6 cm above carina. Enteric catheter passes below diaphragm tip excluded by collimation, side port projecting over gastric body. External defibrillator pads overlie the heart. Cardiac silhouette is enlarged. There is increased central vascular congestion with bibasilar consolidation and effusions. No pneumothorax. No acute bony abnormalities. IMPRESSION: 1. Support devices as above. 2. Pulmonary edema, with bilateral pleural effusions. Electronically Signed   By: Randa Ngo M.D.   On: 09/08/2020 01:21   Telemetry    RSR - Personally Reviewed  Cardiac Studies   Cardiac Catheterization 3.14.2022  Left Main  Vessel is angiographically normal.  Left Anterior Descending  Ost LAD to Prox LAD lesion is 100% stenosed. The lesion is not complex (non high-C) and thrombotic with left-to-right collateral flow. The lesion was not previously treated.      **The LAD was successfully treated with a 3.5 x 65m Resolute Onyx DES**  Mid LAD lesion is 30% stenosed.  Second Septal Branch  Collaterals  2nd Sept filled by collaterals from 3rd Mrg.    Ramus Intermedius  Vessel is angiographically normal.  Left Circumflex  Vessel is angiographically normal.  First Obtuse  Marginal Branch  Vessel is small in size. Vessel is angiographically normal.  Second Obtuse Marginal Branch  Vessel is small in size. Vessel is angiographically normal.  Third Obtuse Marginal Branch  Vessel is angiographically normal.  Right Coronary Artery  Vessel is angiographically normal.  Right Posterior Descending Artery  Vessel is angiographically normal.  Right Posterior Atrioventricular Artery  Vessel is angiographically normal.   LV gram: EF 25-35%  _____________  2D Echocardiogram 3.15.2022   1. Left ventricular ejection fraction, by estimation, is 30 to 35%. The  left ventricle has moderately decreased function. The left ventricle  demonstrates regional wall motion abnormalities (see scoring  diagram/findings for description). There is mild  left ventricular hypertrophy. Left ventricular diastolic parameters were  normal. There is moderate hypokinesis of the left ventricular, mid-apical  anteroseptal wall, anterior wall and anterolateral wall. The average left  ventricular global longitudinal  strain is -5.3 %. The global longitudinal strain is abnormal.   2. Right ventricular systolic function is normal. The right ventricular  size is normal. Tricuspid regurgitation signal is inadequate for assessing  PA pressure.   3. The mitral valve is normal in structure. No evidence of mitral valve  regurgitation. No evidence of mitral stenosis.   4. The aortic valve is normal in structure. Aortic valve regurgitation is  not visualized. No aortic stenosis is present.  _____________   Patient Profile     45y.o. male w/ a h[facility]o tobacco abuse, [facility] cardiac arrest and Anterolateral STEMI s/p PCI/DES of the occluded LAD 3/14.  Course complicated by asp pna and CHF/ICM (EF 25-35% by LV gram).  Assessment & Plan    1.  Acute anterolateral STEMI/CAD/VF arrest:  Presented 3/14 followign OOH arrest w/ post-arrest ECG notable for anterolateral  ST elevation.  Found to have an occluded LAD on cath w/ EF of 25-35%.  EDP 164mg.  S/p PIC/DES to the ost/prox LAD.  HsTrop peaked @ >27k.  Echo 3/15 w/ EF 30-35%.  No FH of premature CAD.  Remains intubated and sedated.  Still req norepi,  thus no  blocker.  Cont asa, statin, brilinta.  Plan lifevest @ discharge.  2.  Cardiogenic Shock/Hypotension:  Remains on norepi.  EF 30-35%.  CVP trending 6-8 - lasix d/c'd.    3.  Acute systolic CHF/ICM:  EF 93-73% by echo 3/15.  CVP trending 6-8 this AM. Euvolemic on exam.  Lungs clear.  No  blocker, acei/arb/arni/spiro in setting of ongoing hypotension req norepi.  Plan for lifevest prior to discharge.  4.  Aspiration PNA/Leukocytosis/Fever:  abx per IM. Afebrile.  WBC 14.5.  5.  Hypokalemia/Hyperkalemia:  Stable this AM.  6.  AKI:  Creat nl this AM.  7.  Substance abuse:  No reported home meds.  UDS + for benzos and amphetamines.  Will need further discussion and counseling.  8.  HL:  Cont high potency statin.  LDL 75.  Signed, Murray Hodgkins, NP  09/09/2020, 9:14 AM    For questions or updates, please contact   Please consult [email or web address] for contact info under Cardiology/STEMI.

## 2020-09-09 NOTE — Consult Note (Signed)
PHARMACY CONSULT NOTE  Pharmacy Consult for Electrolyte Monitoring and Replacement   Recent Labs: Potassium (mmol/L)  Date Value  09/09/2020 4.4   Magnesium (mg/dL)  Date Value  87/86/7672 1.9   Calcium (mg/dL)  Date Value  09/47/0962 8.5 (L)   Albumin (g/dL)  Date Value  83/66/2947 3.5   Phosphorus (mg/dL)  Date Value  65/46/5035 1.8 (L)   Sodium (mmol/L)  Date Value  09/09/2020 138   Corrected Ca: 8.9 mg/dL  Assessment: Patient is a 45 y/o M with unknown PMH who presented to the ED after outside of hospital Vfib cardiac arrest secondary to STEMI. ACLS was initiated with ROSC. Patient went for emergent cardiac catheterization with successful angioplasty and DES placed to ostial LAD which was completely occluded. Patient is intubated, sedated, and on mechanical ventilation in the ICU. Pharmacy has been consulted to assist with electrolyte monitoring and replacement as indicated.  Goal of Therapy:  Potassium 4.0 - 5.1 mmol/L Magnesium 2.0 - 2.4 mg/dL All other electrolytes within normal limits  Plan:   K-Phos Neutral 250 mg (contains 8 mmol phosphorous and 1.1 mEq potassium per dose) x 3 per NP  2 grams IV magnesium sulfate x 1 per NP  Re-check electrolytes with AM labs  Lowella Bandy 09/09/2020 6:50 AM

## 2020-09-09 NOTE — Progress Notes (Signed)
GOALS OF CARE DISCUSSION  The Clinical status was relayed to family in detail. Mother at bedside  Patient with increased Agitation and unable to follow commands Oxygen sats dropped into 80's.   Updated and notified of patients medical condition.  Patient remains unresponsive and will not open eyes to command.   Remains intubated,  Patient is having a weak cough and struggling to remove secretions.   Patient with increased WOB and using accessory muscles to breathe Explained to family course of therapy and the modalities   Will plan for repeat SAT/SBT tomorrow AM  Family are satisfied with Plan of action and management. All questions answered  Additional CC time 32 mins   Rulon Abdalla Santiago Glad, M.D.  [facility]  Medical Director [facility] Medical Director [facility] Cardio-Pulmonary Department

## 2020-09-10 ENCOUNTER — Inpatient Hospital Stay: Payer: No Typology Code available for payment source

## 2020-09-10 DIAGNOSIS — R112 Nausea with vomiting, unspecified: Secondary | ICD-10-CM

## 2020-09-10 DIAGNOSIS — J9601 Acute respiratory failure with hypoxia: Secondary | ICD-10-CM

## 2020-09-10 DIAGNOSIS — I255 Ischemic cardiomyopathy: Secondary | ICD-10-CM

## 2020-09-10 LAB — BASIC METABOLIC PANEL
Anion gap: 5 (ref 5–15)
BUN: 17 mg/dL (ref 6–20)
CO2: 28 mmol/L (ref 22–32)
Calcium: 8.4 mg/dL — ABNORMAL LOW (ref 8.9–10.3)
Chloride: 105 mmol/L (ref 98–111)
Creatinine, Ser: 0.84 mg/dL (ref 0.61–1.24)
GFR, Estimated: >60 mL/min (ref >60)
Glucose, Bld: 132 mg/dL — ABNORMAL HIGH (ref 70–99)
Potassium: 3.9 mmol/L (ref 3.5–5.1)
Sodium: 138 mmol/L (ref 135–145)

## 2020-09-10 LAB — CBC WITH DIFFERENTIAL/PLATELET
Abs Immature Granulocytes: 0.1 10*3/uL — ABNORMAL HIGH (ref 0.00–0.07)
Basophils Absolute: 0 10*3/uL (ref 0.0–0.1)
Basophils Relative: 0 %
Eosinophils Absolute: 0 10*3/uL (ref 0.0–0.5)
Eosinophils Relative: 0 %
HCT: 30.2 % — ABNORMAL LOW (ref 39.0–52.0)
Hemoglobin: 10 g/dL — ABNORMAL LOW (ref 13.0–17.0)
Immature Granulocytes: 1 %
Lymphocytes Relative: 7 %
Lymphs Abs: 0.9 10*3/uL (ref 0.7–4.0)
MCH: 31.2 pg (ref 26.0–34.0)
MCHC: 33.1 g/dL (ref 30.0–36.0)
MCV: 94.1 fL (ref 80.0–100.0)
Monocytes Absolute: 0.9 10*3/uL (ref 0.1–1.0)
Monocytes Relative: 7 %
Neutro Abs: 11.2 10*3/uL — ABNORMAL HIGH (ref 1.7–7.7)
Neutrophils Relative %: 85 %
Platelets: 215 10*3/uL (ref 150–400)
RBC: 3.21 MIL/uL — ABNORMAL LOW (ref 4.22–5.81)
RDW: 13 % (ref 11.5–15.5)
WBC: 13 10*3/uL — ABNORMAL HIGH (ref 4.0–10.5)
nRBC: 0 % (ref 0.0–0.2)

## 2020-09-10 LAB — GLUCOSE, CAPILLARY
Glucose-Capillary: 102 mg/dL — ABNORMAL HIGH (ref 70–99)
Glucose-Capillary: 120 mg/dL — ABNORMAL HIGH (ref 70–99)
Glucose-Capillary: 121 mg/dL — ABNORMAL HIGH (ref 70–99)
Glucose-Capillary: 131 mg/dL — ABNORMAL HIGH (ref 70–99)
Glucose-Capillary: 134 mg/dL — ABNORMAL HIGH (ref 70–99)
Glucose-Capillary: 145 mg/dL — ABNORMAL HIGH (ref 70–99)
Glucose-Capillary: 97 mg/dL (ref 70–99)

## 2020-09-10 LAB — PROCALCITONIN: Procalcitonin: 0.47 ng/mL

## 2020-09-10 LAB — MAGNESIUM: Magnesium: 2.3 mg/dL (ref 1.7–2.4)

## 2020-09-10 LAB — CULTURE, RESPIRATORY W GRAM STAIN: Culture: NORMAL

## 2020-09-10 LAB — PHOSPHORUS: Phosphorus: 2.7 mg/dL (ref 2.5–4.6)

## 2020-09-10 MED ORDER — ATORVASTATIN CALCIUM 80 MG PO TABS
80.0000 mg | ORAL_TABLET | Freq: Every day | ORAL | Status: DC
  Administered 2020-09-11 – 2020-09-12 (×2): 80 mg via ORAL
  Filled 2020-09-10 (×2): qty 1

## 2020-09-10 MED ORDER — SODIUM CHLORIDE 0.9 % IV SOLN
12.5000 mg | Freq: Four times a day (QID) | INTRAVENOUS | Status: DC | PRN
  Administered 2020-09-10: 12.5 mg via INTRAVENOUS
  Filled 2020-09-10 (×2): qty 0.5

## 2020-09-10 MED ORDER — ASPIRIN 81 MG PO CHEW
81.0000 mg | CHEWABLE_TABLET | Freq: Every day | ORAL | Status: DC
  Administered 2020-09-11 – 2020-09-13 (×3): 81 mg via ORAL
  Filled 2020-09-10 (×3): qty 1

## 2020-09-10 MED ORDER — POTASSIUM CHLORIDE 10 MEQ/100ML IV SOLN
10.0000 meq | INTRAVENOUS | Status: AC
  Administered 2020-09-10 (×2): 10 meq via INTRAVENOUS
  Filled 2020-09-10 (×2): qty 100

## 2020-09-10 MED ORDER — POLYETHYLENE GLYCOL 3350 17 G PO PACK: 17.0000 g | PACK | Freq: Every day | ORAL | Status: DC | PRN

## 2020-09-10 MED ORDER — ACETAMINOPHEN 325 MG PO TABS
650.0000 mg | ORAL_TABLET | ORAL | Status: DC | PRN
  Administered 2020-09-11 (×3): 650 mg via ORAL
  Filled 2020-09-10 (×4): qty 2

## 2020-09-10 MED ORDER — DOCUSATE SODIUM 50 MG/5ML PO LIQD
100.0000 mg | Freq: Two times a day (BID) | ORAL | Status: DC | PRN
  Administered 2020-09-11: 100 mg via ORAL
  Filled 2020-09-10 (×2): qty 10

## 2020-09-10 MED ORDER — TICAGRELOR 90 MG PO TABS
90.0000 mg | ORAL_TABLET | Freq: Two times a day (BID) | ORAL | Status: DC
  Administered 2020-09-11 – 2020-09-13 (×5): 90 mg via ORAL
  Filled 2020-09-10 (×5): qty 1

## 2020-09-10 NOTE — Progress Notes (Signed)
Nutrition Follow Up Note   DOCUMENTATION CODES:   Not applicable  INTERVENTION:   RD will add supplements once diet advanced  Pt at refeed risk; recommend monitor potassium, magnesium and phosphorus labs daily until stable  NUTRITION DIAGNOSIS:   Inadequate oral intake related to inability to eat (pt sedated and ventilated) as evidenced by NPO status. -ongoing   GOAL:   Patient will meet greater than or equal to 90% of their needs  -not met   MONITOR:   Diet advancement,Labs,Weight trends,Skin,I & O's  ASSESSMENT:   44 yo male with h/o substance abuse admitted with acute and massive MI cardiac arrest and acute and severe hypoxic resp failure, due to severe ischemic cardiomyopathy and severe systolic CHF  Pt extubated today. RD will add supplements once diet advanced. Pt is likely at refeed risk. Per chart, pt is weight stable since admit.   Medications reviewed and include: aspirin, insulin, solu-medrol, brilinta, unasyn, precedex, pepcid  Labs reviewed: K 3.9 wnl, P 2.7 wnl, Mg 2.3 wnl Wbc- 13.0(H), Hgb 10.0(L), Hct 30.2(L) cbgs- 145, 131, 121, 134 x 24 hrs  Diet Order:   Diet Order            Diet NPO time specified  Diet effective now                EDUCATION NEEDS:   No education needs have been identified at this time  Skin:  Skin Assessment: Reviewed RN Assessment  Last BM:  pta  Height:   Ht Readings from Last 1 Encounters:  09/10/20 5' 7.01" (1.702 m)    Weight:   Wt Readings from Last 1 Encounters:  09/10/20 88.1 kg    Ideal Body Weight:  67.2 kg  BMI:  Body mass index is 30.41 kg/m.  Estimated Nutritional Needs:   Kcal:  2100-2400kcal/day  Protein:  105-120g/day  Fluid:  2.0-2.3L/day  Casey Campbell MS, RD, LDN Please refer to AMION for RD and/or RD on-call/weekend/after hours pager 

## 2020-09-10 NOTE — Progress Notes (Signed)
Patient with nausea and vomiting.  Administered 4 mg of IV Zofran

## 2020-09-10 NOTE — Progress Notes (Signed)
Patient extubated successfully Alert and awake, minimal oxygen  Follow up Cardiology recs  No infusions, SD status now Transfer to [facility] service   Arella Blinder Santiago Glad, M.D.  [facility]  Medical Director [facility] Medical Director [facility] Cardio-Pulmonary Department

## 2020-09-10 NOTE — Consult Note (Signed)
PHARMACY CONSULT NOTE  Pharmacy Consult for Electrolyte Monitoring and Replacement   Recent Labs: Potassium (mmol/L)  Date Value  09/10/2020 3.9   Magnesium (mg/dL)  Date Value  25/36/6440 2.3   Calcium (mg/dL)  Date Value  34/74/2595 8.4 (L)   Albumin (g/dL)  Date Value  63/87/5643 3.5   Phosphorus (mg/dL)  Date Value  32/95/1884 2.7   Sodium (mmol/L)  Date Value  09/10/2020 138   Assessment: Patient is a 45 y/o M with unknown PMH who presented to the ED after outside of hospital Vfib cardiac arrest secondary to STEMI. ACLS was initiated with ROSC. Patient went for emergent cardiac catheterization with successful angioplasty and DES placed to ostial LAD which was completely occluded. Patient is intubated, sedated, and on mechanical ventilation in the ICU, extubated 3/17. Pharmacy has been consulted to assist with electrolyte monitoring and replacement as indicated.  Goal of Therapy:  Potassium 4.0 - 5.1 mmol/L Magnesium 2.0 - 2.4 mg/dL All other electrolytes within normal limits  Plan:   K 3.9 - KCl 10 mEq IV x 2 doses ordered   No additional electrolyte replacement needed at this time  Re-check electrolytes with AM labs  Reatha Armour, PharmD Pharmacy Resident  09/10/2020 2:32 PM

## 2020-09-10 NOTE — Progress Notes (Signed)
Progress Note  Patient Name: Adam Carter Date of Encounter: 09/10/2020  Primary Cardiologist: New to [facility] - consult by Arida  Subjective   Extubated.  Remains mildly sedated.  Off norepi.  He will open eyes when asked.  Tearful at times.  Inpatient Medications    Scheduled Meds: . aspirin  81 mg Per Tube Daily  . atorvastatin  80 mg Per Tube q1800  . chlorhexidine gluconate (MEDLINE KIT)  15 mL Mouth Rinse BID  . Chlorhexidine Gluconate Cloth  6 each Topical Daily  . insulin aspart  0-9 Units Subcutaneous Q4H  . methylPREDNISolone (SOLU-MEDROL) injection  20 mg Intravenous Q12H  . sodium chloride flush  3 mL Intravenous Q12H  . sodium chloride flush  3 mL Intravenous Q12H  . ticagrelor  90 mg Per Tube BID   Continuous Infusions: . sodium chloride    . sodium chloride    . sodium chloride Stopped (09/10/20 0515)  . ampicillin-sulbactam (UNASYN) IV 3 g (09/10/20 1253)  . dexmedetomidine (PRECEDEX) IV infusion 0.4 mcg/kg/hr (09/10/20 1200)  . famotidine (PEPCID) IV Stopped (09/09/20 1857)   PRN Meds: sodium chloride, sodium chloride, acetaminophen, docusate, ondansetron (ZOFRAN) IV, polyethylene glycol, sodium chloride flush, sodium chloride flush   Vital Signs    Vitals:   09/10/20 0900 09/10/20 1000 09/10/20 1100 09/10/20 1200  BP: 101/73 101/69 100/67 99/71  Pulse: (!) 59 (!) 58 67 (!) 53  Resp: 18 18 16 14   Temp: 98.42 F (36.9 C) 98.06 F (36.7 C) 97.88 F (36.6 C) 98.06 F (36.7 C)  TempSrc:  Bladder Bladder Bladder  SpO2: 94% 94% 96% 94%  Weight:      Height:        Intake/Output Summary (Last 24 hours) at 09/10/2020 1347 Last data filed at 09/10/2020 1253 Gross per 24 hour  Intake 1781.83 ml  Output 1025 ml  Net 756.83 ml   Filed Weights   09/08/20 0500 09/09/20 0500 09/10/20 0500  Weight: 87.3 kg 87.7 kg 88.1 kg    Telemetry    SR with no ventricular ectopy - Personally Reviewed  ECG    No new tracings - Personally Reviewed  Physical  Exam   GEN: No acute distress.   Neck: No JVD. Cardiac: RRR, no murmurs, rubs, or gallops.  Respiratory: Clear to auscultation bilaterally.  GI: Soft, nontender, non-distended.   MS: No edema; No deformity. Neuro:  Somnolent.  Psych: Somnolent.  Labs    Chemistry Recent Labs  Lab 09/07/20 1541 09/08/20 0131 09/08/20 0615 09/08/20 1243 09/09/20 0445 09/10/20 0656  NA 137 137  --   --  138 138  K 2.8* 5.7*   < > 4.5 4.4 3.9  CL 104 109  --   --  109 105  CO2 15* 23  --   --  25 28  GLUCOSE 340* 138*  --   --  173* 132*  BUN 10 18  --   --  12 17  CREATININE 1.43* 1.25*  --   --  0.88 0.84  CALCIUM 9.0 8.1*  --   --  8.5* 8.4*  PROT 6.8 6.4*  --   --   --   --   ALBUMIN 3.7 3.5  --   --   --   --   AST 150* 291*  --   --   --   --   ALT 130* 155*  --   --   --   --   [facility]  90 87  --   --   --   --   BILITOT 0.8 0.9  --   --   --   --   GFRNONAA >60 >60  --   --  >60 >60  ANIONGAP 18* 5  --   --  4* 5   < > = values in this interval not displayed.     Hematology Recent Labs  Lab 09/08/20 1402 09/09/20 0445 09/10/20 0656  WBC 14.6* 14.5* 13.0*  RBC 3.82* 3.71* 3.21*  HGB 12.1* 11.5* 10.0*  HCT 36.0* 35.0* 30.2*  MCV 94.2 94.3 94.1  MCH 31.7 31.0 31.2  MCHC 33.6 32.9 33.1  RDW 12.9 12.8 13.0  PLT 273 260 215    Cardiac EnzymesNo results for input(s): TROPONINI in the last 168 hours. No results for input(s): TROPIPOC in the last 168 hours.   BNPNo results for input(s): BNP, PROBNP in the last 168 hours.   DDimer No results for input(s): DDIMER in the last 168 hours.   Radiology    US RENAL  Result Date: 09/08/2020 IMPRESSION: Normal renal ultrasound. Electronically Signed   By: Virgina Norfolk M.D.   On: 09/08/2020 19:54   DG Chest Port 1 View  Result Date: 09/09/2020 IMPRESSION: Hypoventilation. Bibasilar atelectasis with mild interval improvement in aeration. Electronically Signed   By: Franchot Gallo M.D.   On: 09/09/2020 09:28    Cardiac  Studies   2D echo 09/08/2020: 1. Left ventricular ejection fraction, by estimation, is 30 to 35%. The  left ventricle has moderately decreased function. The left ventricle  demonstrates regional wall motion abnormalities (see scoring  diagram/findings for description). There is mild  left ventricular hypertrophy. Left ventricular diastolic parameters were  normal. There is moderate hypokinesis of the left ventricular, mid-apical  anteroseptal wall, anterior wall and anterolateral wall. The average left  ventricular global longitudinal  strain is -5.3 %. The global longitudinal strain is abnormal.  2. Right ventricular systolic function is normal. The right ventricular  size is normal. Tricuspid regurgitation signal is inadequate for assessing  PA pressure.  3. The mitral valve is normal in structure. No evidence of mitral valve  regurgitation. No evidence of mitral stenosis.  4. The aortic valve is normal in structure. Aortic valve regurgitation is  not visualized. No aortic stenosis is present. __________  [facility] 09/07/2020:   The left ventricular ejection fraction is 25-35% by visual estimate.  There is moderate to severe left ventricular systolic dysfunction.  LV end diastolic pressure is moderately elevated.  Ost LAD to Prox LAD lesion is 100% stenosed.  Post intervention, there is a 0% residual stenosis.  A drug-eluting stent was successfully placed using a STENT RESOLUTE ONYX 3.5X12.  Mid LAD lesion is 30% stenosed.   1.  Out of hospital cardiac arrest with anterior ST elevation myocardial infarction due to ostial occlusion of the LAD.  No other obstructive disease. 2.  Moderately severely reduced LV systolic function with an EF of 25 to 30% with severe anterior/apical hypokinesis. 3.  Successful angioplasty and drug-eluting stent placement to the ostial LAD. 4.  Right heart catheterization was performed after PCI and showed mildly elevated filling pressures, mild  pulmonary hypertension and normal cardiac output.  PA sat was 78.7%.  Fick cardiac output was 6.17 with a cardiac index of 3.5.  CPO was 1.4.  Based on these hemodynamic numbers, no hemodynamic support was utilized.  Recommendations: Dual antiplatelet therapy for at least 1 year. High-dose atorvastatin. We  will run Aggrastat infusion for 12 hours given uncertainty of oral absorption of antiplatelet medications. The patient had significant agitation and this will need to be managed.  He is currently on propofol drip and fentanyl drip.  I discussed with Dr. Mortimer Fries. Recommend electrolyte replacement.  Critical care time of 45 minutes managing abnormal hemodynamics, ventilator management and electrolytes.   Patient Profile     45 y.o. male with history of tobacco use who presented to [facility] on 3/14 with out-of-hospital cardiac arrest and anterior lateral ST EMI status post PCI/DES to an occluded ostial LAD on 09/07/2020 with clinical course being complicated by aspiration pneumonia, and acute HFrEF/ICM with an EF of 25 to 35% by LV gram.  Assessment & Plan    1.  Anterolateral ST EMI/VF arrest/CAD -Presented on 3/14 following out of hospital cardiac arrest.  Upon EMS arrival he was in VF and received 2 shocks and a single dose of epinephrine followed by ROSC.  Postarrest EKG showed evidence of anterior ST elevation and code STEMI was activated.  Upon his arrival to the ED his airway was secured with an ET tube and he was given 4000 units of unfractionated heparin.  He underwent emergent LHC which showed an occluded ostial LAD with otherwise no obstructive disease.  He underwent successful PCI/DES to the ostial LAD.  LV gram demonstrated an EF of 25 to 35% by visual estimate.  High-sensitivity troponin peaked at greater than 27,000. -Echo demonstrated an EF of 30 to 35% -Now extubated and off nor epi though remains mildly sedated -BP precludes escalation of GDMT -Continue DAPT with ASA and ticagrelor  without interruption for at least the next 12 months -Atorvastatin -Maintain potassium and magnesium at goal 4.0 and 2.0 respectively -Plan for LifeVest at discharge  2.  Cardiogenic shock/acute HFrEF: -Cardiogenic shock resolved, no longer requiring vasopressor support -He does not appear volume overloaded -Relative hypotension continues to preclude addition of GDMT, escalate as able -LifeVest at discharge as outlined above -We will need a follow-up echo in several months time following PCI and addition of maximally tolerated evidence-based medical therapy  3.  Acute hypoxic respiratory arrest: -In the setting of the above -Extubated  4.  Aspiration pneumonia/leukocytosis/fever: -Management per CCM  5.  Substance abuse: -No documented reported home meds -Urine drug screen positive for benzodiazepines and amphetamines -We will need further discussion and counseling as sedative medications are weaned  6.  HLD: -LDL 75 this admission -Continue high intensity statin     For questions or updates, please contact [place] Please consult [email or web address] for contact info under Cardiology/STEMI.    Signed, Christell Faith, PA-C [facility] Pager: [phone] 09/10/2020, 1:47 PM

## 2020-09-10 NOTE — Progress Notes (Signed)
Patient complaining of nausea.  Jeri Modena notified.  KUB and phenergan ordered.

## 2020-09-10 NOTE — Progress Notes (Signed)
   09/10/20 0900  Clinical Encounter Type  Visited With Patient and family together  Visit Type Initial;Psychological support;Spiritual support;Social support;Critical Care  Referral From Family  Consult/Referral To Chaplain   Chaplain met PT's family while doing Rounds. PT's family was able to express her emotions. Chaplain used reflective listening and a calming presence to offer spiritual care. Chaplain will do a follow up visit.

## 2020-09-10 NOTE — Progress Notes (Signed)
Patient called out again complaining of nausea.  Phenergan 12.5mg  IV administered

## 2020-09-10 NOTE — Progress Notes (Signed)
Pt was suctioned prior to extubation for a small amount of thick secretions. Per Dr. Clovis Fredrickson order, he was extubated and place on a 3 L nasal cannula.

## 2020-09-10 NOTE — Progress Notes (Addendum)
NAME:  Adam Carter, MRN:  [id number], DOB:  08-13-1975, LOS: 3 ADMISSION DATE:  09/07/2020,   REFERRING MD: Raliegh Ip  CHIEF COMPLAINT:  Cardiac arrest  Brief History:  45 yo male with Cardiac Arrest due to anterior STEMI and Acute Hypoxic Respiratory Failure due to ischemic cardiomyopathy and Acute Decompensated HFrEF.    History of Present Illness:  45 y.o.malewith unknown PMH who presents after cardiac arrest. 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  He then said "oh shit" and his coworkers witnessed him collapse.  He was found to not have a pulse and CPR was initiated by bystanders.  When EMS arrived, the patient was found to be in ventricular fibrillation.  He received 2 shocks and a single dose of epinephrine and had ROSC. EKG postarrest showed evidence of STEMI.   Patient had king airway placed and replaced with ETT CODE STEMI called and patient taken to cath lab  Admitted to ICU for cardiac arrest/resp failure  Past Medical History:       Active Ambulatory Problems    Diagnosis Date Noted  . No Active Ambulatory Problems       Resolved Ambulatory Problems    Diagnosis Date Noted  . No Resolved Ambulatory Problems   No Additional Past Medical History     Significant Hospital Events:  3/14 acute MI s/p cardiac arrest s/p CATH 3/15 remains critically ill 3/16 remains critically ill; failed SBT 3/17: Plan for SBT with Precedex to assist with agitation  Consults:  CARDIOLOGY  PCCM  Procedures:  3/14 CATH-left ventricular ejection fraction is 25-35% by visual estimate.  There is moderate to severe left ventricular systolic dysfunction.  LV end diastolic pressure is moderately elevated.  Ost LAD to Prox LAD lesion is 100% stenosed.  Post intervention, there is a 0% residual stenosis.  A drug-eluting stent was successfully placed using a STENT RESOLUTE ONYX 3.5X12.  Mid LAD lesion is 30% stenosed.  Micro Data:   3/14: VIRAL PANEL NEG 3/14: COVID NEG 3/14: HIV Screen>>nonreactive 3/15: Blood culture x2>> 3/15: Tracheal aspirate>>   Interim History / Subjective:  -Pt failed SBT yesterday due to agitation, hypoxia (sats drop to low 80's during Trial), and thick secretions -No acute events reported overnight -Afebrile, Hemodynamically stable, requiring 2 mcg Levophed -Plan for SBT again today, starting Precedex prior to assist with agitation -Mother at bedside and updated, she wishes to be present for SBT   Objective   Blood pressure 106/75, pulse (!) 58, temperature 98.42 F (36.9 C), temperature source Bladder, resp. rate 18, height 5' 7.01" (1.702 m), weight 88.1 kg, SpO2 95 %. CVP:  [3 mmHg-25 mmHg] 12 mmHg  Vent Mode: PRVC FiO2 (%):  [35 %-45 %] 40 % Set Rate:  [18 bmp] 18 bmp Vt Set:  [500 mL] 500 mL PEEP:  [5 cmH20] 5 cmH20 Pressure Support:  [5 cmH20] 5 cmH20   Intake/Output Summary (Last 24 hours) at 09/10/2020 0819 Last data filed at 09/10/2020 0800 Gross per 24 hour  Intake 1974.89 ml  Output 1520 ml  Net 454.89 ml   Filed Weights   09/08/20 0500 09/09/20 0500 09/10/20 0500  Weight: 87.3 kg 87.7 kg 88.1 kg    REVIEW OF SYSTEMS  PATIENT IS UNABLE TO PROVIDE COMPLETE REVIEW OF SYSTEM S DUE TO SEVERE CRITICAL ILLNESS AND ENCEPHALOPATHY   PHYSICAL EXAMINATION:  GENERAL: Acutely ill-appearing male, laying in bed, intubated and sedated, no acute distress HEAD: Atraumatic, normocephalic EYES: Pupils PERRLA (  2 mm bilaterally and sluggish), no scleral icterus MOUTH: Orally intubated, mucous membranes moist NECK: Supple, no JVD, no thyromegaly PULMONARY: Clear to auscultation bilaterally, no wheezing or rales noted, synchronous with the ventilator, even CARDIOVASCULAR: Bradycardia, regular rhythm (sinus bradycardia on telemetry), S1-S2, no murmurs, rubs, gallops, 2+ distal pulses GASTROINTESTINAL: Soft, nontender, nondistended, no guarding or rebound tenderness, bowel sounds  positive x4 MUSCULOSKELETAL: Normal bulk and tone, no deformities, no edema NEUROLOGIC: Sedated with fentanyl and propofol SKIN: Warm and dry.  No obvious rashes, lesions, ulcerations     Assessment & Plan:  45 yo male with acute and massive MI cardiac arrest and acute and severe hypoxic resp failure, due to severe ischemic cardiomyopathy and severe systolic CHF, possible poly substance abuse amphetamines on drug screen  At this time, patient had immediate CPR and lasted 5-7 mins based on report and was very agitated and trying to pull out ETT, patient was given propofol for sedation, at this time, Hypothermia Protocol may NOT be beneficial in this case    Cardiac Arrest NSTEMI (Anterior STEMI due to Ost LAD to Proximal LAD lesion with 100% Stenosis) Cardiogenic Shock Acute Decompensated HFrEF (EF 30-35%) -Continuous cardiac monitoring -Maintain MAP >65 -Cautious IV fluids given CHF -Vasopressors as needed to maintain MAP goal -Cardiology following, appreciate input -S/p Cardiac cath on admission with successful PCI to the Ostial LAD -Dual Antiplatelet therapy with ASA, Brilinta,  -Continue  Statin -Echocardiogram with LVEF 30-35% -Diuresis as BP and renal function permits ~ currently holding Diuresis due to Vasopressors   Acute Hypoxic Hypercapnic Respiratory Failure in the setting of Cardiac Arrest, CHF, & suspected Aspiration Pneumonia -Full vent support -Wean FiO2 & PEEP as tolerated to maintain O2 sats >92% -Follow intermittent CXR & ABG as needed -Spontaneous breathing trials when respiratory parameters met, and when mental status permits -Implement VAP bundle -Prn Bronchodilators -Continue Unasyn -Diuresis as BP and renal function permits   Suspected Aspiration Pneumonia -Monitor fever curve -Trend WBC's -Follow cultures as above, Tracheal aspirate pending -Continue Unaysn   Normocytic Normochromic Anemia  -Monitor for S/Sx of bleeding -Trend CBC -SCD's  for  VTE Prophylaxis  -Transfuse for Hgb <7   NUTRITIONAL STATUS DIET-->TF's as tolerated Constipation protocol as indicated        CASE DISCUSSED IN MULTIDISCIPLINARY ROUNDS WITH ICU TEAM   BEST PRACTICES: DISPOSITION: ICU GOALS OF CARE: FULL CODE VTE PROPHYLAXIS: SCD'S CONSULTS: CARDIOLOGY UPDATES: UPDATED PT'S MOTHER AT BEDSIDE 09/10/2020   Labs   CBC: Recent Labs  Lab 09/07/20 1541 09/08/20 0131 09/08/20 1402 09/09/20 0445 09/10/20 0656  WBC 17.8* 21.6* 14.6* 14.5* 13.0*  NEUTROABS 8.9*  --  11.7* 12.8* 11.2*  HGB 13.3 13.9 12.1* 11.5* 10.0*  HCT 41.3 41.4 36.0* 35.0* 30.2*  MCV 96.3 93.2 94.2 94.3 94.1  PLT 288 308 273 260 416    Basic Metabolic Panel: Recent Labs  Lab 09/07/20 1541 09/08/20 0131 09/08/20 0615 09/08/20 1243 09/09/20 0445 09/10/20 0656  NA 137 137  --   --  138 138  K 2.8* 5.7* 6.1* 4.5 4.4 3.9  CL 104 109  --   --  109 105  CO2 15* 23  --   --  25 28  GLUCOSE 340* 138*  --   --  173* 132*  BUN 10 18  --   --  12 17  CREATININE 1.43* 1.25*  --   --  0.88 0.84  CALCIUM 9.0 8.1*  --   --  8.5* 8.4*  MG  2.2 1.7  --   --  1.9 2.3  PHOS  --  2.6  --   --  1.8* 2.7   GFR: Estimated Creatinine Clearance: 120.1 mL/min (by C-G formula based on SCr of 0.84 mg/dL). Recent Labs  Lab 09/08/20 0131 09/08/20 0615 09/08/20 1243 09/08/20 1402 09/09/20 0445 09/10/20 0656  PROCALCITON 1.62  --  1.37  --  0.86  --   WBC 21.6*  --   --  14.6* 14.5* 13.0*  LATICACIDVEN 1.6 1.3  --   --   --   --     Liver Function Tests: Recent Labs  Lab 09/07/20 1541 09/08/20 0131  AST 150* 291*  ALT 130* 155*  ALKPHOS 90 87  BILITOT 0.8 0.9  PROT 6.8 6.4*  ALBUMIN 3.7 3.5   No results for input(s): LIPASE, AMYLASE in the last 168 hours. No results for input(s): AMMONIA in the last 168 hours.  ABG    Component Value Date/Time   PHART 7.33 (L) 09/08/2020 0500   PCO2ART 43 09/08/2020 0500   PO2ART 129 (H) 09/08/2020 0500   HCO3 22.7 09/08/2020  0500   ACIDBASEDEF 3.2 (H) 09/08/2020 0500   O2SAT PENDING 09/09/2020 0843     Coagulation Profile: Recent Labs  Lab 09/08/20 1402  INR 1.2    Cardiac Enzymes: No results for input(s): CKTOTAL, CKMB, CKMBINDEX, TROPONINI in the last 168 hours.  HbA1C: Hgb A1c MFr Bld  Date/Time Value Ref Range Status  09/08/2020 01:30 AM 6.0 (H) 4.8 - 5.6 % Final    Comment:    (NOTE) Pre diabetes:          5.7%-6.4%  Diabetes:              >6.4%  Glycemic control for   <7.0% adults with diabetes     CBG: Recent Labs  Lab 09/09/20 1642 09/09/20 1929 09/10/20 0009 09/10/20 0354 09/10/20 0717  GLUCAP 153* 134* 145* 131* 121*    Allergies Not on File   Home Medications  Prior to Admission medications   Medication Sig Start Date End Date Taking? Authorizing Provider  pregabalin (LYRICA) 75 MG capsule Take 75 mg by mouth 3 (three) times daily.   Yes [provider]      Darel Hong, AGACNP-BC [facility] Pager: [phone]      PCCM ATTENDING ATTESTATION:  I have evaluated patient with the APP, I personally  reviewed database in its entirety and discussed care plan in detail. In addition, this patient was discussed on multidisciplinary rounds.   I agree with assessment and plan.  Acute cardiac arrest Ischemic cardiomyopathy Plan for trial of extubation  Critical Care Time devoted to patient care services described in this note is 51mnutes.   Overall, patient is critically ill, prognosis is guarded.  Patient with Multiorgan failure and at high risk for cardiac arrest and death.    KCorrin Parker M.D.  LVelora HecklerPulmonary & Critical Care Medicine  Medical Director IBurgessDirector ATransformations Surgery CenterCardio-Pulmonary Department

## 2020-09-10 NOTE — Progress Notes (Signed)
KUB negative.  Patient called out on call bell requesting something to drink.  Nurse explained that due his nausea and NPO order he was unable to have anything to drink.  Patient then denies having any nausea.  Dr. Belia Heman notified of patients request.  Dr. Belia Heman would like patient to continue to be NPO.  Lemon swabs offered and placed at bedside.  Patient refused oral swabs.

## 2020-09-10 NOTE — Progress Notes (Signed)
GOALS OF CARE DISCUSSION  The Clinical status was relayed to family in detail. Mother at bedside  Updated and notified of patients medical condition.  Family understands the situation.  Patient remains full code Plan for SAT/SBT today and assess for extubation  Family are satisfied with Plan of action and management. All questions answered  Additional CC time 30 mins   Ladarren Steiner Santiago Glad, M.D.  [facility]  Medical Director [facility] Medical Director [facility] Cardio-Pulmonary Department

## 2020-09-10 NOTE — Progress Notes (Signed)
Patient extubated by Amy,CRP.  Patient placed on 3L nasal cannula.  Patient with strong cough at this time but will not say name when asked.  Patient not verbal at this time.  Patient with purposeful movement.

## 2020-09-10 NOTE — Progress Notes (Signed)
Precedex drip started per request of Jeri Modena prior to vent wean.

## 2020-09-10 NOTE — Progress Notes (Addendum)
Propopfol decreased to .for vent wean. Fentanyl turned off.

## 2020-09-10 NOTE — Progress Notes (Addendum)
Patient arousing and followiong simple commands.  Patient placed in spontaneous mode for vent wean by AMY, CRP.

## 2020-09-10 NOTE — Progress Notes (Signed)
Patient attempting to pull ETT.  Dr. Belia Heman at bedside.  Order received to extubate patient.

## 2020-09-11 ENCOUNTER — Inpatient Hospital Stay: Payer: No Typology Code available for payment source

## 2020-09-11 DIAGNOSIS — J9621 Acute and chronic respiratory failure with hypoxia: Secondary | ICD-10-CM

## 2020-09-11 DIAGNOSIS — E7800 Pure hypercholesterolemia, unspecified: Secondary | ICD-10-CM

## 2020-09-11 LAB — BASIC METABOLIC PANEL
Anion gap: 7 (ref 5–15)
BUN: 20 mg/dL (ref 6–20)
CO2: 26 mmol/L (ref 22–32)
Calcium: 8.5 mg/dL — ABNORMAL LOW (ref 8.9–10.3)
Chloride: 106 mmol/L (ref 98–111)
Creatinine, Ser: 0.87 mg/dL (ref 0.61–1.24)
GFR, Estimated: >60 mL/min (ref >60)
Glucose, Bld: 118 mg/dL — ABNORMAL HIGH (ref 70–99)
Potassium: 3.9 mmol/L (ref 3.5–5.1)
Sodium: 139 mmol/L (ref 135–145)

## 2020-09-11 LAB — GLUCOSE, CAPILLARY
Glucose-Capillary: 123 mg/dL — ABNORMAL HIGH (ref 70–99)
Glucose-Capillary: 113 mg/dL — ABNORMAL HIGH (ref 70–99)
Glucose-Capillary: 131 mg/dL — ABNORMAL HIGH (ref 70–99)
Glucose-Capillary: 134 mg/dL — ABNORMAL HIGH (ref 70–99)
Glucose-Capillary: 124 mg/dL — ABNORMAL HIGH (ref 70–99)
Glucose-Capillary: 135 mg/dL — ABNORMAL HIGH (ref 70–99)

## 2020-09-11 LAB — COOXEMETRY PANEL
Carboxyhemoglobin: 1.1 % (ref 0.5–1.5)
Methemoglobin: 0.6 % (ref 0.0–1.5)
O2 Saturation: 67.7 %

## 2020-09-11 LAB — CBC WITH DIFFERENTIAL/PLATELET
Abs Immature Granulocytes: 0.12 10*3/uL — ABNORMAL HIGH (ref 0.00–0.07)
Basophils Absolute: 0 10*3/uL (ref 0.0–0.1)
Basophils Relative: 0 %
Eosinophils Absolute: 0 10*3/uL (ref 0.0–0.5)
Eosinophils Relative: 0 %
HCT: 30.4 % — ABNORMAL LOW (ref 39.0–52.0)
Hemoglobin: 10.2 g/dL — ABNORMAL LOW (ref 13.0–17.0)
Immature Granulocytes: 1 %
Lymphocytes Relative: 8 %
Lymphs Abs: 1.1 10*3/uL (ref 0.7–4.0)
MCH: 31.5 pg (ref 26.0–34.0)
MCHC: 33.6 g/dL (ref 30.0–36.0)
MCV: 93.8 fL (ref 80.0–100.0)
Monocytes Absolute: 1.4 10*3/uL — ABNORMAL HIGH (ref 0.1–1.0)
Monocytes Relative: 9 %
Neutro Abs: 12 10*3/uL — ABNORMAL HIGH (ref 1.7–7.7)
Neutrophils Relative %: 82 %
Platelets: 212 10*3/uL (ref 150–400)
RBC: 3.24 MIL/uL — ABNORMAL LOW (ref 4.22–5.81)
RDW: 13 % (ref 11.5–15.5)
WBC: 14.5 10*3/uL — ABNORMAL HIGH (ref 4.0–10.5)
nRBC: 0 % (ref 0.0–0.2)

## 2020-09-11 LAB — TRIGLYCERIDES: Triglycerides: 176 mg/dL — ABNORMAL HIGH (ref <150)

## 2020-09-11 LAB — PHOSPHORUS: Phosphorus: 3.1 mg/dL (ref 2.5–4.6)

## 2020-09-11 LAB — MAGNESIUM: Magnesium: 2.3 mg/dL (ref 1.7–2.4)

## 2020-09-11 MED ORDER — ENOXAPARIN SODIUM 40 MG/0.4ML ~~LOC~~ SOLN
40.0000 mg | SUBCUTANEOUS | Status: DC
  Administered 2020-09-11 – 2020-09-12 (×2): 40 mg via SUBCUTANEOUS
  Filled 2020-09-11 (×2): qty 0.4

## 2020-09-11 MED ORDER — POTASSIUM CHLORIDE CRYS ER 20 MEQ PO TBCR
20.0000 meq | EXTENDED_RELEASE_TABLET | Freq: Once | ORAL | Status: AC
  Administered 2020-09-11: 20 meq via ORAL
  Filled 2020-09-11: qty 1

## 2020-09-11 MED ORDER — GUAIFENESIN ER 600 MG PO TB12
600.0000 mg | ORAL_TABLET | Freq: Two times a day (BID) | ORAL | Status: DC
  Administered 2020-09-11 – 2020-09-13 (×5): 600 mg via ORAL
  Filled 2020-09-11 (×5): qty 1

## 2020-09-11 MED ORDER — ENSURE ENLIVE PO LIQD
237.0000 mL | Freq: Three times a day (TID) | ORAL | Status: DC
  Administered 2020-09-11 – 2020-09-12 (×5): 237 mL via ORAL

## 2020-09-11 MED ORDER — PREDNISONE 20 MG PO TABS
20.0000 mg | ORAL_TABLET | Freq: Every day | ORAL | Status: DC
  Administered 2020-09-11 – 2020-09-13 (×3): 20 mg via ORAL
  Filled 2020-09-11: qty 2
  Filled 2020-09-11 (×2): qty 1

## 2020-09-11 MED ORDER — POTASSIUM CHLORIDE CRYS ER 20 MEQ PO TBCR: 20.0000 meq | EXTENDED_RELEASE_TABLET | Freq: Once | ORAL | Status: DC

## 2020-09-11 MED ORDER — ADULT MULTIVITAMIN W/MINERALS CH
1.0000 | ORAL_TABLET | Freq: Every day | ORAL | Status: DC
  Administered 2020-09-12 – 2020-09-13 (×2): 1 via ORAL
  Filled 2020-09-11 (×2): qty 1

## 2020-09-11 MED ORDER — FAMOTIDINE 20 MG PO TABS
20.0000 mg | ORAL_TABLET | Freq: Every day | ORAL | Status: DC
  Administered 2020-09-11 – 2020-09-13 (×3): 20 mg via ORAL
  Filled 2020-09-11 (×3): qty 1

## 2020-09-11 NOTE — Hospital Course (Signed)
45 yo male with out of hospital cardiac arrest admitted with anterior STEMI, Acute Hypoxic Respiratory Failure due to ischemic cardiomyopathy and Acute Decompensated HFrEF.   3/14 acute MI s/p cardiac arrest s/p CATH 3/15 remains critically ill 3/16 failed SBT 3/17: extubated and off precedex 3/18: TR[facility] assumed care. Transfer to PCU today

## 2020-09-11 NOTE — Consult Note (Signed)
PHARMACY CONSULT NOTE  Pharmacy Consult for Electrolyte Monitoring and Replacement   Recent Labs: Potassium (mmol/L)  Date Value  09/11/2020 3.9   Magnesium (mg/dL)  Date Value  89/38/1017 2.3   Calcium (mg/dL)  Date Value  51/07/5850 8.5 (L)   Albumin (g/dL)  Date Value  77/82/4235 3.5   Phosphorus (mg/dL)  Date Value  36/14/4315 3.1   Sodium (mmol/L)  Date Value  09/11/2020 139   Assessment: Patient is a 45 y/o M with unknown PMH who presented to the ED after outside of hospital Vfib cardiac arrest secondary to STEMI. ACLS was initiated with ROSC. Patient went for emergent cardiac catheterization with successful angioplasty and DES placed to ostial LAD which was completely occluded. Patient is intubated, sedated, and on mechanical ventilation in the ICU, extubated 3/17. Pharmacy has been consulted to assist with electrolyte monitoring and replacement as indicated.  Goal of Therapy:  Potassium 4.0 - 5.1 mmol/L Magnesium 2.0 - 2.4 mg/dL All other electrolytes within normal limits  Plan:   K 3.9 - KCl 10 mEq IV x 2 doses  No additional electrolyte replacement needed at this time  Re-check electrolytes with AM labs  Reatha Armour, PharmD Pharmacy Resident  09/11/2020 9:15 AM

## 2020-09-11 NOTE — Progress Notes (Addendum)
Progress Note  Patient Name: Adam Carter Date of Encounter: 09/11/2020  Primary Cardiologist: New to [facility] - consult by Fletcher Anon  Subjective   Extubated and norepi stopped on 3/17. More lucid today. Tearful at times. Chest is mildly sore. No dyspnea or palpitations. With regards to his UDS< he reports he was prescribed something by his psychiatrist ~ 2 months prior though does not recall what medication. Vitals stable.  Inpatient Medications    Scheduled Meds: . aspirin  81 mg Oral Daily  . atorvastatin  80 mg Oral q1800  . chlorhexidine gluconate (MEDLINE KIT)  15 mL Mouth Rinse BID  . Chlorhexidine Gluconate Cloth  6 each Topical Daily  . insulin aspart  0-9 Units Subcutaneous Q4H  . methylPREDNISolone (SOLU-MEDROL) injection  20 mg Intravenous Q12H  . sodium chloride flush  3 mL Intravenous Q12H  . sodium chloride flush  3 mL Intravenous Q12H  . ticagrelor  90 mg Oral BID   Continuous Infusions: . sodium chloride    . sodium chloride    . sodium chloride Stopped (09/10/20 0515)  . ampicillin-sulbactam (UNASYN) IV Stopped (09/11/20 0147)  . famotidine (PEPCID) IV Stopped (09/10/20 2053)  . promethazine (PHENERGAN) injection Stopped (09/10/20 1747)   PRN Meds: sodium chloride, sodium chloride, acetaminophen, docusate, ondansetron (ZOFRAN) IV, polyethylene glycol, promethazine (PHENERGAN) injection, sodium chloride flush, sodium chloride flush   Vital Signs    Vitals:   09/11/20 0400 09/11/20 0404 09/11/20 0500 09/11/20 0600  BP: 105/74  113/80 100/72  Pulse: (!) 53  (!) 49 (!) 48  Resp: _0 Temp: 100.04 F (37.8 C)  99.86 F (37.7 C) 99.5 F (37.5 C)  TempSrc: Bladder     SpO2: 97%  98% 98%  Weight:  85.8 kg    Height:        Intake/Output Summary (Last 24 hours) at 09/11/2020 0759 Last data filed at 09/11/2020 0406 Gross per 24 hour  Intake 1457.77 ml  Output 1275 ml  Net 182.77 ml   Filed Weights   09/09/20 0500 09/10/20 0500 09/11/20 0404   Weight: 87.7 kg 88.1 kg 85.8 kg    Telemetry    SR with sinus bradycardia - Personally Reviewed  ECG    No new tracings - Personally Reviewed  Physical Exam   GEN: No acute distress. Scleral hemorrhage.    Neck: No JVD. Cardiac: RRR, no murmurs, rubs, or gallops.  Respiratory: Clear to auscultation bilaterally.  GI: Soft, nontender, non-distended.   MS: No edema; No deformity. Neuro:  Somnolent.  Psych: Somnolent.  Labs    Chemistry Recent Labs  Lab 09/07/20 1541 09/08/20 0131 09/08/20 0615 09/09/20 0445 09/10/20 0656 09/11/20 0355  NA 137 137  --  138 138 139  K 2.8* 5.7*   < > 4.4 3.9 3.9  CL 104 109  --  109 105 106  CO2 15* 23  --  _1 GLUCOSE 340* 138*  --  173* 132* 118*  BUN 10 18  --  _2 CREATININE 1.43* 1.25*  --  0.88 0.84 0.87  CALCIUM 9.0 8.1*  --  8.5* 8.4* 8.5*  PROT 6.8 6.4*  --   --   --   --   ALBUMIN 3.7 3.5  --   --   --   --   AST 150* 291*  --   --   --   --   ALT 130* 155*  --   --   --   --  ALKPHOS 90 87  --   --   --   --   BILITOT 0.8 0.9  --   --   --   --   GFRNONAA >60 >60  --  >60 >60 >60  ANIONGAP 18* 5  --  4* 5 7   < > = values in this interval not displayed.     Hematology Recent Labs  Lab 09/09/20 0445 09/10/20 0656 09/11/20 0355  WBC 14.5* 13.0* 14.5*  RBC 3.71* 3.21* 3.24*  HGB 11.5* 10.0* 10.2*  HCT 35.0* 30.2* 30.4*  MCV 94.3 94.1 93.8  MCH 31.0 31.2 31.5  MCHC 32.9 33.1 33.6  RDW 12.8 13.0 13.0  PLT 260 215 212    Cardiac EnzymesNo results for input(s): TROPONINI in the last 168 hours. No results for input(s): TROPIPOC in the last 168 hours.   BNPNo results for input(s): BNP, PROBNP in the last 168 hours.   DDimer No results for input(s): DDIMER in the last 168 hours.   Radiology    US RENAL  Result Date: 09/08/2020 IMPRESSION: Normal renal ultrasound. Electronically Signed   By: Virgina Norfolk M.D.   On: 09/08/2020 19:54   DG Chest Port 1 View  Result Date:  09/09/2020 IMPRESSION: Hypoventilation. Bibasilar atelectasis with mild interval improvement in aeration. Electronically Signed   By: Franchot Gallo M.D.   On: 09/09/2020 09:28    Cardiac Studies   2D echo 09/08/2020: 1. Left ventricular ejection fraction, by estimation, is 30 to 35%. The  left ventricle has moderately decreased function. The left ventricle  demonstrates regional wall motion abnormalities (see scoring  diagram/findings for description). There is mild  left ventricular hypertrophy. Left ventricular diastolic parameters were  normal. There is moderate hypokinesis of the left ventricular, mid-apical  anteroseptal wall, anterior wall and anterolateral wall. The average left  ventricular global longitudinal  strain is -5.3 %. The global longitudinal strain is abnormal.  2. Right ventricular systolic function is normal. The right ventricular  size is normal. Tricuspid regurgitation signal is inadequate for assessing  PA pressure.  3. The mitral valve is normal in structure. No evidence of mitral valve  regurgitation. No evidence of mitral stenosis.  4. The aortic valve is normal in structure. Aortic valve regurgitation is  not visualized. No aortic stenosis is present. __________  [facility] 09/07/2020:   The left ventricular ejection fraction is 25-35% by visual estimate.  There is moderate to severe left ventricular systolic dysfunction.  LV end diastolic pressure is moderately elevated.  Ost LAD to Prox LAD lesion is 100% stenosed.  Post intervention, there is a 0% residual stenosis.  A drug-eluting stent was successfully placed using a STENT RESOLUTE ONYX 3.5X12.  Mid LAD lesion is 30% stenosed.   1.  Out of hospital cardiac arrest with anterior ST elevation myocardial infarction due to ostial occlusion of the LAD.  No other obstructive disease. 2.  Moderately severely reduced LV systolic function with an EF of 25 to 30% with severe anterior/apical hypokinesis. 3.   Successful angioplasty and drug-eluting stent placement to the ostial LAD. 4.  Right heart catheterization was performed after PCI and showed mildly elevated filling pressures, mild pulmonary hypertension and normal cardiac output.  PA sat was 78.7%.  Fick cardiac output was 6.17 with a cardiac index of 3.5.  CPO was 1.4.  Based on these hemodynamic numbers, no hemodynamic support was utilized.  Recommendations: Dual antiplatelet therapy for at least 1 year. High-dose atorvastatin. We will run Aggrastat  infusion for 12 hours given uncertainty of oral absorption of antiplatelet medications. The patient had significant agitation and this will need to be managed.  He is currently on propofol drip and fentanyl drip.  I discussed with Dr. Mortimer Fries. Recommend electrolyte replacement.  Critical care time of 45 minutes managing abnormal hemodynamics, ventilator management and electrolytes.   Patient Profile     45 y.o. male with history of tobacco use who presented to [facility] on 3/14 with out-of-hospital cardiac arrest and anterior lateral ST EMI status post PCI/DES to an occluded ostial LAD on 09/07/2020 with clinical course being complicated by aspiration pneumonia, and acute HFrEF/ICM with an EF of 25 to 35% by LV gram.  Assessment & Plan    1.  Anterolateral ST EMI/VF arrest/CAD -Presented on 3/14 following out of hospital cardiac arrest.  Upon EMS arrival he was in VF and received 2 shocks and a single dose of epinephrine followed by ROSC.  Postarrest EKG showed evidence of anterior ST elevation and code STEMI was activated.  Upon his arrival to the ED his airway was secured with an ET tube and he was given 4000 units of unfractionated heparin.  He underwent emergent LHC which showed an occluded ostial LAD with otherwise no obstructive disease.  He underwent successful PCI/DES to the ostial LAD.  LV gram demonstrated an EF of 25 to 35% by visual estimate.  High-sensitivity troponin peaked at greater than  27,000. -Echo demonstrated an EF of 30 to 35% -Now extubated and off nor epi as of 3/17 -BP/HR precludes escalation of GDMT, escalate as able -Continue DAPT with ASA and ticagrelor without interruption for at least the next 12 months, importance discussed in detail  -Atorvastatin -Maintain potassium and magnesium at goal 4.0 and 2.0 respectively -Plan for LifeVest at discharge -Cardiac rehab as an outpatient   2.  Cardiogenic shock/acute HFrEF: -Cardiogenic shock resolved, no longer requiring vasopressor support -He does not appear volume overloaded -Relative hypotension and heart rates continue to preclude addition of GDMT, escalate as able -LifeVest at discharge as outlined above -We will need a follow-up echo in several months time following PCI and addition of maximally tolerated evidence-based medical therapy  3.  Acute hypoxic respiratory arrest: -In the setting of the above -Extubated  4.  Aspiration pneumonia/leukocytosis/fever: -Management per CCM  5.  Substance abuse: -No documented reported home meds -Urine drug screen positive for benzodiazepines (given in the cath lab) and amphetamines -Will need to see if amphetamine can cross react with fentanyl (which was also given in the cath lab)  6.  HLD: -LDL 75 this admission -Continue high intensity statin    For questions or updates, please contact [place] Please consult [email or web address] for contact info under Cardiology/STEMI.    Signed, Christell Faith, PA-C [facility] Pager: [phone] 09/11/2020, 7:59 AM

## 2020-09-11 NOTE — Progress Notes (Signed)
[place] [facility]   PATIENT NAME: Adam Carter    MR#:  [id number]  DATE OF BIRTH:  [birth year]  SUBJECTIVE:  CHIEF COMPLAINT:  No chief complaint on file. minimal chest soreness (from CPR) but no other complaints, family at bedside REVIEW OF SYSTEMS:  Review of Systems  Constitutional: Negative for diaphoresis, fever, malaise/fatigue and weight loss.  HENT: Negative for ear discharge, ear pain, hearing loss, nosebleeds, sore throat and tinnitus.   Eyes: Negative for blurred vision and pain.  Respiratory: Negative for cough, hemoptysis, shortness of breath and wheezing.   Cardiovascular: Positive for chest pain. Negative for palpitations, orthopnea and leg swelling.  Gastrointestinal: Negative for abdominal pain, blood in stool, constipation, diarrhea, heartburn, nausea and vomiting.  Genitourinary: Negative for dysuria, frequency and urgency.  Musculoskeletal: Negative for back pain and myalgias.  Skin: Negative for itching and rash.  Neurological: Negative for dizziness, tingling, tremors, focal weakness, seizures, weakness and headaches.  Psychiatric/Behavioral: Negative for depression. The patient is not nervous/anxious.    DRUG ALLERGIES:  Not on File VITALS:  Blood pressure 101/61, pulse 62, temperature 99.14 F (37.3 C), resp. rate (!) 24, height 5' 7.01" (1.702 m), weight 85.8 kg, SpO2 98 %. PHYSICAL EXAMINATION:  Physical Exam  45 year old male lying in the bed comfortably without any acute distress Eyes: Conjunctival hemorrhage in both eyes Neck: supple no jugular venous distention -left IJ triple-lumen in place since 3/15 Cardiovascular: S1-S2 normal no murmur rales or gallop Lungs: clear to auscultation bilaterally no wheezing rales rhonchi crepitation Neuro: alert and oriented Abdomen: soft nontender nondistended LABORATORY PANEL:  Male CBC Recent Labs  Lab 09/11/20 0355  WBC 14.5*  HGB 10.2*  HCT 30.4*  PLT 212    ------------------------------------------------------------------------------------------------------------------ Chemistries  Recent Labs  Lab 09/08/20 0131 09/08/20 0615 09/11/20 0355  NA 137   < > 139  K 5.7*   < > 3.9  CL 109   < > 106  CO2 23   < > 26  GLUCOSE 138*   < > 118*  BUN 18   < > 20  CREATININE 1.25*   < > 0.87  CALCIUM 8.1*   < > 8.5*  MG 1.7   < > 2.3  AST 291*  --   --   ALT 155*  --   --   ALKPHOS 87  --   --   BILITOT 0.9  --   --    < > = values in this interval not displayed.   RADIOLOGY:  DG Abd 1 View  Result Date: 09/10/2020 CLINICAL DATA:  Nauseated EXAM: ABDOMEN - 1 VIEW COMPARISON:  None. FINDINGS: The bowel gas pattern is normal. No radio-opaque calculi or other significant radiographic abnormality are seen. IMPRESSION: Negative. Electronically Signed   By: Jonna Clark M.D.   On: 09/10/2020 16:17   DG Chest Port 1 View  Result Date: 09/11/2020 CLINICAL DATA:  Acute respiratory failure with hypoxia. EXAM: PORTABLE CHEST 1 VIEW COMPARISON:  09/09/2020. FINDINGS: Interval extubation. Nasogastric tube has been removed. Left IJ central line tip is near the SVC RA junction. Heart size stable. There is central pulmonary vascular congestion with bibasilar streaky atelectasis. Aeration in the lung bases has improved from 09/09/2020. Left pleural effusion is decreased. Small right pleural effusion. Right hemidiaphragm is elevated. IMPRESSION: 1. Central pulmonary vascular congestion with bibasilar atelectasis. Aeration in the lung bases has improved from 09/09/2020. 2. Small bilateral pleural effusions. Electronically Signed   By: Leanna Battles M.D.  On: 09/11/2020 08:03   ASSESSMENT AND PLAN:  45 yo male with out of hospital cardiac arrest admitted with anterior STEMI, Acute Hypoxic Respiratory Failure due to ischemic cardiomyopathy and Acute Decompensated HFrEF.   3/14 acute MI s/p cardiac arrest s/p CATH 3/15 remains critically ill 3/16 failed  SBT 3/17: extubated and off precedex 3/18: TRH assumed care. Transfer to PCU today  Out of hospital cardiac arrest on 3/14 Anterolateral STEMI V. fib arrest CAD S/p cath showing occluded ostial LAD and successful PCI/DES to the ostial LAD EF of 25 to 35% Continue aspirin and ticagrelor for next 12 months per cardiology Continue high intensity statin Plan for LifeVest before discharge Cardiac rehab at discharge  Acute hypoxic respiratory failure Aspiration pneumonia Requiring intubation on admission, extubated on 3/17 Now on 3 L oxygen via nasal cannula IV Unasyn  Cardiogenic shock Required pressors early on.  Off pressors now  Acute systolic CHF due to ischemic cardiomyopathy EF of 25 to 35% Currently due to low blood pressure - not able to start ACE-I or beta-blocker  Hyperlipidemia Lipitor  Body mass index is 29.62 kg/m.  Net IO Since Admission: 703.36 mL [09/11/20 1138]      Status is: Inpatient  Remains inpatient appropriate because:Inpatient level of care appropriate due to severity of illness   Dispo: The patient is from: Home              Anticipated d/c is to: Home               Patient currently is not medically stable to d/c.   Difficult to place patient No   Transfer out to PCU    DVT prophylaxis:       enoxaparin (LOVENOX) injection 40 mg Start: 09/11/20 2200 SCDs Start: 09/07/20 1718     Family Communication: Mother updated at bedside on 3/18   All the records are reviewed and case discussed with Care Management/Social Worker. Management plans discussed with the patient, family, cardio and they are in agreement.  CODE STATUS: Prior Level of care: Progressive Cardiac  TOTAL TIME TAKING CARE OF THIS PATIENT: 35 minutes.   More than 50% of the time was spent in counseling/coordination of care: YES  POSSIBLE D/C IN 2-3 DAYS, DEPENDING ON CLINICAL CONDITION.   Delfino Lovett M.D on 09/11/2020 at 11:38 AM  [facility]   CC:  Primary care physician; System, Provider Not In  Note: This dictation was prepared with Dragon dictation along with smaller phrase technology. Any transcriptional errors that result from this process are unintentional.

## 2020-09-11 NOTE — Progress Notes (Signed)
PHARMACIST - PHYSICIAN COMMUNICATION  DR: Sherryll Burger  CONCERNING: IV to Oral Route Change Policy  RECOMMENDATION: This patient is receiving famotidine by the intravenous route.  Based on criteria approved by the Pharmacy and Therapeutics Committee, the intravenous medication(s) is/are being converted to the equivalent oral dose form(s).   DESCRIPTION: These criteria include:  The patient is eating (either orally or via tube) and/or has been taking other orally administered medications for a least 24 hours  The patient has no evidence of active gastrointestinal bleeding or impaired GI absorption (gastrectomy, short bowel, patient on TNA or NPO).  If you have questions about this conversion, please contact the Pharmacy Department  [x]   [phone] )  [facility]  [facility], PharmD Pharmacy Resident  09/11/2020 9:20 AM

## 2020-09-11 NOTE — Progress Notes (Signed)
Mobility Specialist - Progress Note   09/11/20 1600  Mobility  Activity Ambulated to bathroom  Range of Motion/Exercises Right leg;Left leg (AP, SLR, SM)  Level of Assistance Minimal assist, patient does 75% or more  Assistive Device Front wheel walker  Distance Ambulated (ft) 15 ft  Mobility Response Tolerated well  Mobility performed by Mobility specialist  $Mobility charge 1 Mobility    Pre-mobility: 57 HR, 95% SpO2 During mobility: 62 HR, 94% SpO2 Post-mobility: 59 HR, 95% SpO2   Pt sat EOB with minA. C/o pain in lower back with positional change that did not increase/decrease with activities this date. Pt participated in seated therex: ankle pumps x10, straight leg raises x10, standing march x10. VC needed for hand placement to stand to RW. No dizziness. Pt then requested assistance to use bathroom. Pt ambulated with minA at slowed pace. No LOB noted. No c/o chest pain throughout session. When asked what was the hardest part about session, pt responded "everything". Rated RPE 6/10. O2 > 93% on 1L. Tolerated well. Family at bedside. RN notified of performance.   Filiberto Pinks Mobility Specialist 09/11/20, 4:52 PM

## 2020-09-12 ENCOUNTER — Inpatient Hospital Stay: Payer: No Typology Code available for payment source

## 2020-09-12 DIAGNOSIS — J9601 Acute respiratory failure with hypoxia: Secondary | ICD-10-CM

## 2020-09-12 DIAGNOSIS — I2510 Atherosclerotic heart disease of native coronary artery without angina pectoris: Secondary | ICD-10-CM

## 2020-09-12 LAB — CBC WITH DIFFERENTIAL/PLATELET
Abs Immature Granulocytes: 0.08 10*3/uL — ABNORMAL HIGH (ref 0.00–0.07)
Basophils Absolute: 0 10*3/uL (ref 0.0–0.1)
Basophils Relative: 0 %
Eosinophils Absolute: 0.1 10*3/uL (ref 0.0–0.5)
Eosinophils Relative: 1 %
HCT: 32.8 % — ABNORMAL LOW (ref 39.0–52.0)
Hemoglobin: 10.7 g/dL — ABNORMAL LOW (ref 13.0–17.0)
Immature Granulocytes: 1 %
Lymphocytes Relative: 26 %
Lymphs Abs: 2.8 10*3/uL (ref 0.7–4.0)
MCH: 30.7 pg (ref 26.0–34.0)
MCHC: 32.6 g/dL (ref 30.0–36.0)
MCV: 94.3 fL (ref 80.0–100.0)
Monocytes Absolute: 1.1 10*3/uL — ABNORMAL HIGH (ref 0.1–1.0)
Monocytes Relative: 10 %
Neutro Abs: 6.8 10*3/uL (ref 1.7–7.7)
Neutrophils Relative %: 62 %
Platelets: 236 10*3/uL (ref 150–400)
RBC: 3.48 MIL/uL — ABNORMAL LOW (ref 4.22–5.81)
RDW: 12.8 % (ref 11.5–15.5)
WBC: 10.8 10*3/uL — ABNORMAL HIGH (ref 4.0–10.5)
nRBC: 0 % (ref 0.0–0.2)

## 2020-09-12 LAB — PHOSPHORUS: Phosphorus: 2.5 mg/dL (ref 2.5–4.6)

## 2020-09-12 LAB — BASIC METABOLIC PANEL
Anion gap: 4 — ABNORMAL LOW (ref 5–15)
BUN: 20 mg/dL (ref 6–20)
CO2: 26 mmol/L (ref 22–32)
Calcium: 8.5 mg/dL — ABNORMAL LOW (ref 8.9–10.3)
Chloride: 110 mmol/L (ref 98–111)
Creatinine, Ser: 0.92 mg/dL (ref 0.61–1.24)
GFR, Estimated: >60 mL/min (ref >60)
Glucose, Bld: 110 mg/dL — ABNORMAL HIGH (ref 70–99)
Potassium: 3.3 mmol/L — ABNORMAL LOW (ref 3.5–5.1)
Sodium: 140 mmol/L (ref 135–145)

## 2020-09-12 LAB — MAGNESIUM: Magnesium: 2.1 mg/dL (ref 1.7–2.4)

## 2020-09-12 LAB — GLUCOSE, CAPILLARY
Glucose-Capillary: 105 mg/dL — ABNORMAL HIGH (ref 70–99)
Glucose-Capillary: 106 mg/dL — ABNORMAL HIGH (ref 70–99)
Glucose-Capillary: 120 mg/dL — ABNORMAL HIGH (ref 70–99)
Glucose-Capillary: 129 mg/dL — ABNORMAL HIGH (ref 70–99)
Glucose-Capillary: 162 mg/dL — ABNORMAL HIGH (ref 70–99)

## 2020-09-12 MED ORDER — POTASSIUM CHLORIDE CRYS ER 20 MEQ PO TBCR
30.0000 meq | EXTENDED_RELEASE_TABLET | Freq: Three times a day (TID) | ORAL | Status: AC
  Administered 2020-09-12 (×3): 30 meq via ORAL
  Filled 2020-09-12 (×3): qty 1

## 2020-09-12 MED ORDER — AMOXICILLIN-POT CLAVULANATE 875-125 MG PO TABS
1.0000 | ORAL_TABLET | Freq: Two times a day (BID) | ORAL | Status: AC
  Administered 2020-09-12 (×2): 1 via ORAL
  Filled 2020-09-12 (×2): qty 1

## 2020-09-12 MED ORDER — FUROSEMIDE 10 MG/ML IJ SOLN
40.0000 mg | Freq: Once | INTRAMUSCULAR | Status: AC
  Administered 2020-09-12: 40 mg via INTRAVENOUS
  Filled 2020-09-12: qty 4

## 2020-09-12 MED ORDER — FUROSEMIDE 20 MG PO TABS
20.0000 mg | ORAL_TABLET | Freq: Every day | ORAL | Status: DC
  Administered 2020-09-13: 20 mg via ORAL
  Filled 2020-09-12: qty 1

## 2020-09-12 MED ORDER — POTASSIUM CHLORIDE CRYS ER 20 MEQ PO TBCR: 30.0000 meq | EXTENDED_RELEASE_TABLET | Freq: Two times a day (BID) | ORAL | Status: DC

## 2020-09-12 MED ORDER — LOSARTAN POTASSIUM 25 MG PO TABS
12.5000 mg | ORAL_TABLET | Freq: Every day | ORAL | Status: DC
  Administered 2020-09-12 – 2020-09-13 (×2): 12.5 mg via ORAL
  Filled 2020-09-12 (×2): qty 1

## 2020-09-12 NOTE — Progress Notes (Signed)
Progress Note  Patient Name: Adam Carter Date of Encounter: 09/12/2020  Primary Cardiologist: New to [facility] - consult by Fletcher Anon  Subjective   Extubated and norepi stopped on 3/17. Transitioned out of the ICU to progressive care on 3/18. Chest is mildly sore from CPR. Feels chest congestion. Coughing up white sputum. T max 99.1 over the past 24 hours. No palpitations. Vitals stable.  Inpatient Medications    Scheduled Meds: . aspirin  81 mg Oral Daily  . atorvastatin  80 mg Oral q1800  . chlorhexidine gluconate (MEDLINE KIT)  15 mL Mouth Rinse BID  . Chlorhexidine Gluconate Cloth  6 each Topical Daily  . enoxaparin (LOVENOX) injection  40 mg Subcutaneous Q24H  . famotidine  20 mg Oral Daily  . feeding supplement  237 mL Oral TID BM  . guaiFENesin  600 mg Oral BID  . insulin aspart  0-9 Units Subcutaneous Q4H  . multivitamin with minerals  1 tablet Oral Daily  . predniSONE  20 mg Oral Q breakfast  . sodium chloride flush  3 mL Intravenous Q12H  . sodium chloride flush  3 mL Intravenous Q12H  . ticagrelor  90 mg Oral BID   Continuous Infusions: . sodium chloride    . sodium chloride 250 mL (09/11/20 1303)  . sodium chloride Stopped (09/10/20 0515)  . ampicillin-sulbactam (UNASYN) IV 3 g (09/12/20 0243)  . promethazine (PHENERGAN) injection Stopped (09/10/20 1747)   PRN Meds: sodium chloride, sodium chloride, acetaminophen, docusate, ondansetron (ZOFRAN) IV, polyethylene glycol, promethazine (PHENERGAN) injection, sodium chloride flush, sodium chloride flush   Vital Signs    Vitals:   09/11/20 1212 09/11/20 1648 09/11/20 2007 09/12/20 0410  BP: 120/78 120/77 125/85 125/85  Pulse: 60 (!) 54 61 (!) 56  Resp: _0 Temp: 98.5 F (36.9 C) 98.6 F (37 C) 98.6 F (37 C) 98.2 F (36.8 C)  TempSrc: Oral Oral    SpO2: 95% 95% 96% 94%  Weight:    89.4 kg  Height:        Intake/Output Summary (Last 24 hours) at 09/12/2020 0749 Last data filed at 09/12/2020  0243 Gross per 24 hour  Intake 1781 ml  Output 1175 ml  Net 606 ml   Filed Weights   09/10/20 0500 09/11/20 0404 09/12/20 0410  Weight: 88.1 kg 85.8 kg 89.4 kg    Telemetry    SR with sinus bradycardia with rates in the upper 40s to 70s bpm - Personally Reviewed  ECG    No new tracings - Personally Reviewed  Physical Exam   GEN: No acute distress. Scleral hemorrhage.    Neck: No JVD. Cardiac: RRR, no murmurs, rubs, or gallops.  Respiratory: Bibasilar crackles.  GI: Soft, nontender, non-distended.   MS: No edema; No deformity. Neuro:  Somnolent.  Psych: Somnolent.  Labs    Chemistry Recent Labs  Lab 09/07/20 1541 09/08/20 0131 09/08/20 0615 09/10/20 0656 09/11/20 0355 09/12/20 0448  NA 137 137   < > 138 139 140  K 2.8* 5.7*   < > 3.9 3.9 3.3*  CL 104 109   < > 105 106 110  CO2 15* 23   < > _1 GLUCOSE 340* 138*   < > 132* 118* 110*  BUN 10 18   < > _2 CREATININE 1.43* 1.25*   < > 0.84 0.87 0.92  CALCIUM 9.0 8.1*   < > 8.4* 8.5* 8.5*  PROT 6.8 6.4*  --   --   --   --  ALBUMIN 3.7 3.5  --   --   --   --   AST 150* 291*  --   --   --   --   ALT 130* 155*  --   --   --   --   ALKPHOS 90 87  --   --   --   --   BILITOT 0.8 0.9  --   --   --   --   GFRNONAA >60 >60   < > >60 >60 >60  ANIONGAP 18* 5   < > 5 7 4*   < > = values in this interval not displayed.     Hematology Recent Labs  Lab 09/10/20 0656 09/11/20 0355 09/12/20 0448  WBC 13.0* 14.5* 10.8*  RBC 3.21* 3.24* 3.48*  HGB 10.0* 10.2* 10.7*  HCT 30.2* 30.4* 32.8*  MCV 94.1 93.8 94.3  MCH 31.2 31.5 30.7  MCHC 33.1 33.6 32.6  RDW 13.0 13.0 12.8  PLT 215 212 236    Cardiac EnzymesNo results for input(s): TROPONINI in the last 168 hours. No results for input(s): TROPIPOC in the last 168 hours.   BNPNo results for input(s): BNP, PROBNP in the last 168 hours.   DDimer No results for input(s): DDIMER in the last 168 hours.   Radiology    US RENAL  Result Date:  09/08/2020 IMPRESSION: Normal renal ultrasound. Electronically Signed   By: Virgina Norfolk M.D.   On: 09/08/2020 19:54   DG Chest Port 1 View  Result Date: 09/09/2020 IMPRESSION: Hypoventilation. Bibasilar atelectasis with mild interval improvement in aeration. Electronically Signed   By: Franchot Gallo M.D.   On: 09/09/2020 09:28    Cardiac Studies   2D echo 09/08/2020: 1. Left ventricular ejection fraction, by estimation, is 30 to 35%. The  left ventricle has moderately decreased function. The left ventricle  demonstrates regional wall motion abnormalities (see scoring  diagram/findings for description). There is mild  left ventricular hypertrophy. Left ventricular diastolic parameters were  normal. There is moderate hypokinesis of the left ventricular, mid-apical  anteroseptal wall, anterior wall and anterolateral wall. The average left  ventricular global longitudinal  strain is -5.3 %. The global longitudinal strain is abnormal.  2. Right ventricular systolic function is normal. The right ventricular  size is normal. Tricuspid regurgitation signal is inadequate for assessing  PA pressure.  3. The mitral valve is normal in structure. No evidence of mitral valve  regurgitation. No evidence of mitral stenosis.  4. The aortic valve is normal in structure. Aortic valve regurgitation is  not visualized. No aortic stenosis is present. __________  [facility] 09/07/2020:   The left ventricular ejection fraction is 25-35% by visual estimate.  There is moderate to severe left ventricular systolic dysfunction.  LV end diastolic pressure is moderately elevated.  Ost LAD to Prox LAD lesion is 100% stenosed.  Post intervention, there is a 0% residual stenosis.  A drug-eluting stent was successfully placed using a STENT RESOLUTE ONYX 3.5X12.  Mid LAD lesion is 30% stenosed.   1.  Out of hospital cardiac arrest with anterior ST elevation myocardial infarction due to ostial occlusion of  the LAD.  No other obstructive disease. 2.  Moderately severely reduced LV systolic function with an EF of 25 to 30% with severe anterior/apical hypokinesis. 3.  Successful angioplasty and drug-eluting stent placement to the ostial LAD. 4.  Right heart catheterization was performed after PCI and showed mildly elevated filling pressures, mild pulmonary hypertension and normal  cardiac output.  PA sat was 78.7%.  Fick cardiac output was 6.17 with a cardiac index of 3.5.  CPO was 1.4.  Based on these hemodynamic numbers, no hemodynamic support was utilized.  Recommendations: Dual antiplatelet therapy for at least 1 year. High-dose atorvastatin. We will run Aggrastat infusion for 12 hours given uncertainty of oral absorption of antiplatelet medications. The patient had significant agitation and this will need to be managed.  He is currently on propofol drip and fentanyl drip.  I discussed with Dr. Mortimer Fries. Recommend electrolyte replacement.  Critical care time of 45 minutes managing abnormal hemodynamics, ventilator management and electrolytes.   Patient Profile     45 y.o. male with history of tobacco use who presented to [facility] on 3/14 with out-of-hospital cardiac arrest and anterior lateral ST EMI status post PCI/DES to an occluded ostial LAD on 09/07/2020 with clinical course being complicated by aspiration pneumonia, and acute HFrEF/ICM with an EF of 25 to 35% by LV gram.  Assessment & Plan    1.  Anterolateral ST EMI/VF arrest/CAD -Presented on 3/14 following out of hospital cardiac arrest.  Upon EMS arrival he was in VF and received 2 shocks and a single dose of epinephrine followed by ROSC.  Postarrest EKG showed evidence of anterior ST elevation and code STEMI was activated.  Upon his arrival to the ED his airway was secured with an ET tube and he was given 4000 units of unfractionated heparin.  He underwent emergent LHC which showed an occluded ostial LAD with otherwise no obstructive disease.   He underwent successful PCI/DES to the ostial LAD.  LV gram demonstrated an EF of 25 to 35% by visual estimate.  High-sensitivity troponin peaked at greater than 27,000. -Echo demonstrated an EF of 30 to 35% -Now extubated and off nor epi as of 3/17 -Transferred out of the ICU to progressive care on 3/18 -Add low dose losartan 12.5 mg daily today -HR is bradycardic at times, precluding addition of Coreg at this time -Throughout his admission, BP/HR has precluded escalation of GDMT, escalate as able -Continue DAPT with ASA and ticagrelor without interruption for at least the next 12 months, importance discussed in detail  -Atorvastatin -Maintain potassium and magnesium at goal 4.0 and 2.0 respectively -Plan for LifeVest at discharge, [employer] has been contacted and reports they will be out this weekend to get him fitted for this. They will assist in coverage for this as he does not have insurance  -Cardiac rehab as an outpatient   2.  Cardiogenic shock/acute HFrEF: -Cardiogenic shock resolved, no longer requiring vasopressor support with noted continued improvement in BP -With improvement in BP, add low dose losartan 12.5 mg -HR in the low 60s bpm currently and at times has been bradycardic, precluding addition of beta blocker -With noted dyspnea and crackles on exam, check CXR this morning, concern for volume trending up -Give a one-time dose of IV Lasix 40 mg -LifeVest at discharge as outlined above -We will need a follow-up echo in several months time following PCI and addition of maximally tolerated evidence-based medical therapy  3.  Acute hypoxic respiratory arrest: -In the setting of the above -Extubated  4.  Aspiration pneumonia/leukocytosis/fever: -WBC improving  -ABX per IM -Management per CCM  5.  Substance abuse: -No documented reported home meds -Urine drug screen positive for benzodiazepines (given in the cath lab) and amphetamines -Will need to see if amphetamine can  cross react with fentanyl (which was also given in the cath lab) -  No drug use over the prior 20 years   6.  HLD: -LDL 75 this admission -Continue high intensity statin  7. Hypokalemia: -Replete to goal 4.0    For questions or updates, please contact [place] Please consult [email or web address] for contact info under Cardiology/STEMI.    Signed, Christell Faith, PA-C [place] Pager: [phone] 09/12/2020, 7:49 AM

## 2020-09-12 NOTE — Consult Note (Signed)
PHARMACY CONSULT NOTE  Pharmacy Consult for Electrolyte Monitoring and Replacement   Recent Labs: Potassium (mmol/L)  Date Value  09/12/2020 3.3 (L)   Magnesium (mg/dL)  Date Value  62/83/1517 2.1   Calcium (mg/dL)  Date Value  61/60/7371 8.5 (L)   Albumin (g/dL)  Date Value  12/21/9483 3.5   Phosphorus (mg/dL)  Date Value  46/27/0350 2.5   Sodium (mmol/L)  Date Value  09/12/2020 140   Assessment: Patient is a 45 y/o M with unknown PMH who presented to the ED after outside of hospital Vfib cardiac arrest secondary to STEMI. ACLS was initiated with ROSC. Patient went for emergent cardiac catheterization with successful angioplasty and DES placed to ostial LAD which was completely occluded. Extubated 3/17. Pharmacy has been consulted to assist with electrolyte monitoring and replacement as indicated.  Goal of Therapy:  Potassium 4.0 - 5.1 mmol/L Magnesium 2.0 - 2.4 mg/dL All other electrolytes within normal limits  Plan:  Medical team ordered KCl 30 mEq x 3 doses.   No additional electrolyte replacement needed at this time  Re-check electrolytes with AM labs  Ronnald Ramp, PharmD 09/12/2020 8:32 AM

## 2020-09-12 NOTE — Progress Notes (Signed)
[facility]  -  at [facility]   PATIENT NAME: Adam Carter    MR#:  [id number]  DATE OF BIRTH:  02/04/1976  SUBJECTIVE:   Overall doing well. Family at bedside. Has some cough with productive phlegm. Patient was a bit emotional with the whole situation. Comfort provided. REVIEW OF SYSTEMS:   Review of Systems  Constitutional: Negative for chills, fever and weight loss.  HENT: Negative for ear discharge, ear pain and nosebleeds.   Eyes: Negative for blurred vision, pain and discharge.  Respiratory: Positive for cough. Negative for sputum production, shortness of breath, wheezing and stridor.   Cardiovascular: Negative for chest pain, palpitations, orthopnea and PND.  Gastrointestinal: Negative for abdominal pain, diarrhea, nausea and vomiting.  Genitourinary: Negative for frequency and urgency.  Musculoskeletal: Negative for back pain and joint pain.  Neurological: Positive for weakness. Negative for sensory change, speech change and focal weakness.  Psychiatric/Behavioral: Negative for depression and hallucinations. The patient is nervous/anxious.    Tolerating Diet:yes Tolerating PT: ambulatory  DRUG ALLERGIES:  Not on File  VITALS:  Blood pressure 112/76, pulse 74, temperature 98.8 F (37.1 C), temperature source Oral, resp. rate 16, height 5' 7.01" (1.702 m), weight 89.4 kg, SpO2 95 %.  PHYSICAL EXAMINATION:   Physical Exam  GENERAL:  45 y.o.-year-old patient lying in the bed with no acute distress.  HEENT: Head atraumatic, normocephalic. Oropharynx and nasopharynx clear.  LUNGS coarse breath sounds bilaterally, no wheezing, rales, rhonchi. No use of accessory muscles of respiration.  CARDIOVASCULAR: S1, S2 normal. No murmurs, rubs, or gallops.  ABDOMEN: Soft, nontender, nondistended. Bowel sounds present. No organomegaly or mass.  EXTREMITIES: No cyanosis, clubbing or edema b/l.    NEUROLOGIC: Cranial nerves II through XII are intact. No focal  Motor or sensory deficits b/l.   PSYCHIATRIC:  patient is alert and oriented x 3.  SKIN: No obvious rash, lesion, or ulcer.   LABORATORY PANEL:  CBC Recent Labs  Lab 09/12/20 0448  WBC 10.8*  HGB 10.7*  HCT 32.8*  PLT 236    Chemistries  Recent Labs  Lab 09/08/20 0131 09/08/20 0615 09/12/20 0448  NA 137   < > 140  K 5.7*   < > 3.3*  CL 109   < > 110  CO2 23   < > 26  GLUCOSE 138*   < > 110*  BUN 18   < > 20  CREATININE 1.25*   < > 0.92  CALCIUM 8.1*   < > 8.5*  MG 1.7   < > 2.1  AST 291*  --   --   ALT 155*  --   --   ALKPHOS 87  --   --   BILITOT 0.9  --   --    < > = values in this interval not displayed.   Cardiac Enzymes No results for input(s): TROPONINI in the last 168 hours. RADIOLOGY:  DG Abd 1 View  Result Date: 09/10/2020 CLINICAL DATA:  Nauseated EXAM: ABDOMEN - 1 VIEW COMPARISON:  None. FINDINGS: The bowel gas pattern is normal. No radio-opaque calculi or other significant radiographic abnormality are seen. IMPRESSION: Negative. Electronically Signed   By: Jonna Clark M.D.   On: 09/10/2020 16:17   DG Chest Port 1 View  Result Date: 09/11/2020 CLINICAL DATA:  Acute respiratory failure with hypoxia. EXAM: PORTABLE CHEST 1 VIEW COMPARISON:  09/09/2020. FINDINGS: Interval extubation. Nasogastric tube has been removed. Left IJ central line tip is near the SVC RA  junction. Heart size stable. There is central pulmonary vascular congestion with bibasilar streaky atelectasis. Aeration in the lung bases has improved from 09/09/2020. Left pleural effusion is decreased. Small right pleural effusion. Right hemidiaphragm is elevated. IMPRESSION: 1. Central pulmonary vascular congestion with bibasilar atelectasis. Aeration in the lung bases has improved from 09/09/2020. 2. Small bilateral pleural effusions. Electronically Signed   By: Leanna Battles M.D.   On: 09/11/2020 08:03   ASSESSMENT AND PLAN:   yo male without of hospital cardiac arrest admitted  withanteriorSTEMI, Acute Hypoxic Respiratory Failure due to ischemic cardiomyopathy and Acute Decompensated HFrEF.  3/14 acute MI s/p cardiac arrest s/p CATH 3/15 remains critically ill 3/16 failed SBT 3/17: extubated and off precedex 3/18: [facility] assumed care. Transferred to PCU  3/19: overall stable. Life vest placed today.  Out of hospital cardiac arrest on 3/14 Anterolateral STEMI V. fib arrest CAD --S/p cath showing occluded ostial LAD and successful PCI/DES to the ostial LAD --EF of 25 to 35% --Continue aspirin and ticagrelor for next 12 months per cardiology --Continue high intensity statin -- LifeVest/zoll monitor placed today --Cardiac rehab at discharge --cont low dose losartan. BB not started due to intermittent bradycardia  Acute hypoxic respiratory failure Aspiration pneumonia --Requiring intubation on admission, extubated on 3/17 --Now on 3 L oxygen via nasal cannula--now on RA 95% --IV Unasyn--change to po for total 5 days  Cardiogenic shock Required pressors early on.  Off pressors now--BP remains stable  Acute systolic CHF due to ischemic cardiomyopathy --EF of 25 to 35% --received IV Lasix x1 today  Hyperlipidemia Lipitor  Body mass index is 29.62 kg/m.  Overall improving slowly. Discussed with patient's father and stepmother at bedside.    Status is: Inpatient  Remains inpatient appropriate because:Inpatient level of care appropriate due to severity of illness   Dispo: The patient is from: Home  Anticipated d/c is to: Home   Patient currently is  medically stable. Will continue to monitor for one more day and if remains stable in seen by cardiology will try to discharge him tomorrow.               Difficult to place patient No   D/w Dr Azucena Cecil and Ryan,PA    DVT prophylaxis:       enoxaparin (LOVENOX) injection 40 mg Start: 09/11/20 2200 SCDs Start: 09/07/20 1718     Family Communication:  Mother and father updated at bedside on 3/19   TOTAL TIME TAKING CARE OF THIS PATIENT: *25* minutes.  >50% time spent on counselling and coordination of care  Note: This dictation was prepared with Dragon dictation along with smaller phrase technology. Any transcriptional errors that result from this process are unintentional.  Enedina Finner M.D    [facility]   CC: Primary care physician; System, Provider Not InPatient ID: Adam Carter, male   DOB: 02/10/1976, 45 y.o.   MRN: [id number]

## 2020-09-13 LAB — BASIC METABOLIC PANEL
Anion gap: 7 (ref 5–15)
BUN: 19 mg/dL (ref 6–20)
CO2: 21 mmol/L — ABNORMAL LOW (ref 22–32)
Calcium: 8.4 mg/dL — ABNORMAL LOW (ref 8.9–10.3)
Chloride: 110 mmol/L (ref 98–111)
Creatinine, Ser: 0.78 mg/dL (ref 0.61–1.24)
GFR, Estimated: >60 mL/min (ref >60)
Glucose, Bld: 97 mg/dL (ref 70–99)
Potassium: 3.7 mmol/L (ref 3.5–5.1)
Sodium: 138 mmol/L (ref 135–145)

## 2020-09-13 LAB — CULTURE, BLOOD (ROUTINE X 2)
Culture: NO GROWTH
Culture: NO GROWTH
Special Requests: ADEQUATE

## 2020-09-13 LAB — GLUCOSE, CAPILLARY: Glucose-Capillary: 157 mg/dL — ABNORMAL HIGH (ref 70–99)

## 2020-09-13 MED ORDER — FAMOTIDINE 20 MG PO TABS: 20.0000 mg | ORAL_TABLET | Freq: Every day | ORAL | 0 refills | Status: DC

## 2020-09-13 MED ORDER — ADULT MULTIVITAMIN W/MINERALS CH: 1.0000 | ORAL_TABLET | Freq: Every day | ORAL | 0 refills | Status: DC

## 2020-09-13 MED ORDER — ATORVASTATIN CALCIUM 80 MG PO TABS: 80.0000 mg | ORAL_TABLET | Freq: Every day | ORAL | 3 refills | Status: DC

## 2020-09-13 MED ORDER — TICAGRELOR 90 MG PO TABS: 90.0000 mg | ORAL_TABLET | Freq: Two times a day (BID) | ORAL | 3 refills | Status: DC

## 2020-09-13 MED ORDER — ENSURE ENLIVE PO LIQD: 237.0000 mL | Freq: Three times a day (TID) | ORAL | 12 refills | Status: DC

## 2020-09-13 MED ORDER — FUROSEMIDE 20 MG PO TABS: 20.0000 mg | ORAL_TABLET | Freq: Every day | ORAL | 0 refills | Status: DC

## 2020-09-13 MED ORDER — GUAIFENESIN ER 600 MG PO TB12: 600.0000 mg | ORAL_TABLET | Freq: Two times a day (BID) | ORAL | 0 refills | Status: DC

## 2020-09-13 MED ORDER — ASPIRIN 81 MG PO CHEW: 81.0000 mg | CHEWABLE_TABLET | Freq: Every day | ORAL | 2 refills | Status: DC

## 2020-09-13 MED ORDER — POTASSIUM CHLORIDE CRYS ER 20 MEQ PO TBCR
30.0000 meq | EXTENDED_RELEASE_TABLET | Freq: Once | ORAL | Status: AC
  Administered 2020-09-13: 30 meq via ORAL
  Filled 2020-09-13: qty 1

## 2020-09-13 MED ORDER — LOSARTAN POTASSIUM 25 MG PO TABS: 12.5000 mg | ORAL_TABLET | Freq: Every day | ORAL | 3 refills | Status: DC

## 2020-09-13 NOTE — Consult Note (Signed)
PHARMACY CONSULT NOTE  Pharmacy Consult for Electrolyte Monitoring and Replacement   Recent Labs: Potassium (mmol/L)  Date Value  09/13/2020 3.7   Magnesium (mg/dL)  Date Value  16/03/9603 2.1   Calcium (mg/dL)  Date Value  54/02/8118 8.4 (L)   Albumin (g/dL)  Date Value  14/78/2956 3.5   Phosphorus (mg/dL)  Date Value  21/30/8657 2.5   Sodium (mmol/L)  Date Value  09/13/2020 138   Assessment: Patient is a 45 y/o M with unknown PMH who presented to the ED after outside of hospital Vfib cardiac arrest secondary to STEMI. ACLS was initiated with ROSC. Patient went for emergent cardiac catheterization with successful angioplasty and DES placed to ostial LAD which was completely occluded. Extubated 3/17. Pharmacy has been consulted to assist with electrolyte monitoring and replacement as indicated.  Goal of Therapy:  Potassium 4.0 - 5.1 mmol/L Magnesium 2.0 - 2.4 mg/dL All other electrolytes within normal limits  Plan:  Medical team ordered KCl 30 mEq x 1 doses.   No additional electrolyte replacement needed at this time  Re-check electrolytes with AM labs  Ronnald Ramp, PharmD 09/13/2020 8:24 AM

## 2020-09-13 NOTE — Progress Notes (Signed)
Progress Note  Patient Name: Adam Carter Date of Encounter: 09/13/2020  Primary Cardiologist: New to [facility] - consult by Fletcher Anon  Subjective   Extubated and norepi stopped on 3/17. Transitioned out of the ICU to progressive care on 3/18. Chest remains mildly sore from CPR. With dyspnea and chest congestion noted on 3/19, CXR showed mild bilateral atelectasis and small pleural effusions. He was given IV Lasix 40 mg x 1 with documented minimal documented UOP. With this, he has noted some improvement in dyspnea. Afebrile. Stable vitals. Wearing LifeVest with no alarms noted.   Inpatient Medications    Scheduled Meds: . aspirin  81 mg Oral Daily  . atorvastatin  80 mg Oral q1800  . chlorhexidine gluconate (MEDLINE KIT)  15 mL Mouth Rinse BID  . Chlorhexidine Gluconate Cloth  6 each Topical Daily  . enoxaparin (LOVENOX) injection  40 mg Subcutaneous Q24H  . famotidine  20 mg Oral Daily  . feeding supplement  237 mL Oral TID BM  . furosemide  20 mg Oral Daily  . guaiFENesin  600 mg Oral BID  . insulin aspart  0-9 Units Subcutaneous Q4H  . losartan  12.5 mg Oral Daily  . multivitamin with minerals  1 tablet Oral Daily  . predniSONE  20 mg Oral Q breakfast  . sodium chloride flush  3 mL Intravenous Q12H  . sodium chloride flush  3 mL Intravenous Q12H  . ticagrelor  90 mg Oral BID   Continuous Infusions: . sodium chloride    . sodium chloride 250 mL (09/11/20 1303)  . promethazine (PHENERGAN) injection Stopped (09/10/20 1747)   PRN Meds: sodium chloride, sodium chloride, acetaminophen, docusate, ondansetron (ZOFRAN) IV, polyethylene glycol, promethazine (PHENERGAN) injection, sodium chloride flush, sodium chloride flush   Vital Signs    Vitals:   09/12/20 1143 09/12/20 1632 09/12/20 1933 09/13/20 0500  BP: 112/76 117/89 117/80   Pulse: 74 67 71   Resp: 16 19 16    Temp: 98.8 F (37.1 C) 98.4 F (36.9 C) 98.3 F (36.8 C)   TempSrc: Oral Oral Oral   SpO2: 95% 96% 98%    Weight:    88.2 kg  Height:        Intake/Output Summary (Last 24 hours) at 09/13/2020 0754 Last data filed at 09/12/2020 2000 Gross per 24 hour  Intake 720 ml  Output 450 ml  Net 270 ml   Filed Weights   09/11/20 0404 09/12/20 0410 09/13/20 0500  Weight: 85.8 kg 89.4 kg 88.2 kg    Telemetry    Telemetry down with hospital wide power outage this morning - Personally Reviewed  ECG    No new tracings - Personally Reviewed  Physical Exam   GEN: No acute distress. Scleral hemorrhage.    Neck: No JVD. Cardiac: RRR, no murmurs, rubs, or gallops.  Respiratory: Bibasilar crackles.  GI: Soft, nontender, non-distended.   MS: No edema; No deformity. Neuro:  Somnolent.  Psych: Somnolent.  Labs    Chemistry Recent Labs  Lab 09/07/20 1541 09/08/20 0131 09/08/20 0615 09/11/20 0355 09/12/20 0448 09/13/20 0358  NA 137 137   < > 139 140 138  K 2.8* 5.7*   < > 3.9 3.3* 3.7  CL 104 109   < > 106 110 110  CO2 15* 23   < > 26 26 21*  GLUCOSE 340* 138*   < > 118* 110* 97  BUN 10 18   < > 20 20 19   CREATININE 1.43* 1.25*   < >  0.87 0.92 0.78  CALCIUM 9.0 8.1*   < > 8.5* 8.5* 8.4*  PROT 6.8 6.4*  --   --   --   --   ALBUMIN 3.7 3.5  --   --   --   --   AST 150* 291*  --   --   --   --   ALT 130* 155*  --   --   --   --   ALKPHOS 90 87  --   --   --   --   BILITOT 0.8 0.9  --   --   --   --   GFRNONAA >60 >60   < > >60 >60 >60  ANIONGAP 18* 5   < > 7 4* 7   < > = values in this interval not displayed.     Hematology Recent Labs  Lab 09/10/20 0656 09/11/20 0355 09/12/20 0448  WBC 13.0* 14.5* 10.8*  RBC 3.21* 3.24* 3.48*  HGB 10.0* 10.2* 10.7*  HCT 30.2* 30.4* 32.8*  MCV 94.1 93.8 94.3  MCH 31.2 31.5 30.7  MCHC 33.1 33.6 32.6  RDW 13.0 13.0 12.8  PLT 215 212 236    Cardiac EnzymesNo results for input(s): TROPONINI in the last 168 hours. No results for input(s): TROPIPOC in the last 168 hours.   BNPNo results for input(s): BNP, PROBNP in the last 168 hours.    DDimer No results for input(s): DDIMER in the last 168 hours.   Radiology    US RENAL  Result Date: 09/08/2020 IMPRESSION: Normal renal ultrasound. Electronically Signed   By: Virgina Norfolk M.D.   On: 09/08/2020 19:54   DG Chest Port 1 View  Result Date: 09/09/2020 IMPRESSION: Hypoventilation. Bibasilar atelectasis with mild interval improvement in aeration. Electronically Signed   By: Franchot Gallo M.D.   On: 09/09/2020 09:28    Cardiac Studies   2D echo 09/08/2020: 1. Left ventricular ejection fraction, by estimation, is 30 to 35%. The  left ventricle has moderately decreased function. The left ventricle  demonstrates regional wall motion abnormalities (see scoring  diagram/findings for description). There is mild  left ventricular hypertrophy. Left ventricular diastolic parameters were  normal. There is moderate hypokinesis of the left ventricular, mid-apical  anteroseptal wall, anterior wall and anterolateral wall. The average left  ventricular global longitudinal  strain is -5.3 %. The global longitudinal strain is abnormal.  2. Right ventricular systolic function is normal. The right ventricular  size is normal. Tricuspid regurgitation signal is inadequate for assessing  PA pressure.  3. The mitral valve is normal in structure. No evidence of mitral valve  regurgitation. No evidence of mitral stenosis.  4. The aortic valve is normal in structure. Aortic valve regurgitation is  not visualized. No aortic stenosis is present. __________  [facility] 09/07/2020:   The left ventricular ejection fraction is 25-35% by visual estimate.  There is moderate to severe left ventricular systolic dysfunction.  LV end diastolic pressure is moderately elevated.  Ost LAD to Prox LAD lesion is 100% stenosed.  Post intervention, there is a 0% residual stenosis.  A drug-eluting stent was successfully placed using a STENT RESOLUTE ONYX 3.5X12.  Mid LAD lesion is 30% stenosed.    1.  Out of hospital cardiac arrest with anterior ST elevation myocardial infarction due to ostial occlusion of the LAD.  No other obstructive disease. 2.  Moderately severely reduced LV systolic function with an EF of 25 to 30% with severe anterior/apical hypokinesis. 3.  Successful angioplasty and drug-eluting stent placement to the ostial LAD. 4.  Right heart catheterization was performed after PCI and showed mildly elevated filling pressures, mild pulmonary hypertension and normal cardiac output.  PA sat was 78.7%.  Fick cardiac output was 6.17 with a cardiac index of 3.5.  CPO was 1.4.  Based on these hemodynamic numbers, no hemodynamic support was utilized.  Recommendations: Dual antiplatelet therapy for at least 1 year. High-dose atorvastatin. We will run Aggrastat infusion for 12 hours given uncertainty of oral absorption of antiplatelet medications. The patient had significant agitation and this will need to be managed.  He is currently on propofol drip and fentanyl drip.  I discussed with Dr. Mortimer Fries. Recommend electrolyte replacement.  Critical care time of 45 minutes managing abnormal hemodynamics, ventilator management and electrolytes.   Patient Profile     45 y.o. male with history of tobacco use who presented to [facility] on 3/14 with out-of-hospital cardiac arrest and anterior lateral ST EMI status post PCI/DES to an occluded ostial LAD on 09/07/2020 with clinical course being complicated by aspiration pneumonia, and acute HFrEF/ICM with an EF of 25 to 35% by LV gram.  Assessment & Plan    1.  Anterolateral ST EMI/VF arrest/CAD -Presented on 3/14 following out of hospital cardiac arrest.  Upon EMS arrival he was in VF and received 2 shocks and a single dose of epinephrine followed by ROSC.  Postarrest EKG showed evidence of anterior ST elevation and code STEMI was activated.  Upon his arrival to the ED his airway was secured with an ET tube and he was given 4000 units of  unfractionated heparin.  He underwent emergent LHC which showed an occluded ostial LAD with otherwise no obstructive disease.  He underwent successful PCI/DES to the ostial LAD.  LV gram demonstrated an EF of 25 to 35% by visual estimate.  High-sensitivity troponin peaked at greater than 27,000. -Echo demonstrated an EF of 30 to 35% -Now extubated and off nor epi as of 3/17 -Transferred out of the ICU to progressive care on 3/18 -Continue low dose losartan 12.5 mg daily -HR is bradycardic at times, precluding addition of Coreg at this time -Throughout his admission, BP/HR has precluded escalation of GDMT, escalate as able -Continue DAPT with ASA and ticagrelor without interruption for at least the next 12 months, importance discussed in detail  -Atorvastatin -Maintain potassium and magnesium at goal 4.0 and 2.0 respectively -He has been fit for LifeVest and is wearing this, instructions covered  -I gave him a [facility] card -Cardiac rehab as an outpatient   2.  Cardiogenic shock/acute HFrEF: -Cardiogenic shock resolved, no longer requiring vasopressor support with noted continued improvement in BP -With improvement in BP, he has been started on low dose losartan 12.5 mg -HR in the low upper 50s to 60s bpm, precluding addition of beta blocker -Received IV Lasix 40 mg x 1 on 3/19 with improvement in dyspnea  -Oral Lasix 20 mg daily started today -Unable to further escalate GDMT at this time given heart rates and relative hypotension noted this admission, look to escalate this in outpatient follow up -LifeVest as outlined above -We will need a follow-up echo in several months time following PCI and addition of maximally tolerated evidence-based medical therapy -CHF education   3.  Acute hypoxic respiratory arrest: -In the setting of the above -Extubated  4.  Aspiration pneumonia/leukocytosis/fever: -WBC improving  -ABX per IM -Management per CCM  5.  Substance abuse: -No documented  reported home  meds -Urine drug screen positive for benzodiazepines (given in the cath lab) and amphetamines -Will need to see if amphetamine can cross react with fentanyl (which was also given in the cath lab) -No drug use over the prior 20 years   6.  HLD: -LDL 75 this admission -Continue high intensity statin  7. Hypokalemia: -Replete to goal 4.0    For questions or updates, please contact Rafael Hernandez Please consult [email or web address] for contact info under Cardiology/STEMI.    Signed, Christell Faith, PA-C [place] Pager: [phone] 09/13/2020, 7:54 AM

## 2020-09-13 NOTE — Progress Notes (Signed)
Patient discharged to home. Tele and IV d/c'd.  Patient TLC removed by this RN. Catheter intact.  Patient tolerated well.  Verbalizes understanding of discharge instructions and use of life vest.

## 2020-09-13 NOTE — TOC Transition Note (Signed)
Transition of Care [facility]) - CM/SW Discharge Note   Patient Details  Name: Adam Carter MRN: [id number] Date of Birth: March 23, 1976  Transition of Care [facility]) CM/SW Contact:  Bing Quarry, RN Phone Number: 09/13/2020, 12:38 PM   Clinical Narrative:   Patient being discharged today. LifeVest on patient at time of encounter. Patient education material with patient. RN discussing AVS and care. Family member had questions about obtaining a cardiologist closer to [facility] as well as the cardiopulmomary rehab. Advised to attend first cardiology visits post discharge in one week as well as CP rehab services appointment and if too burdensome, discuss with individual services regarding closer services to [facility], but to keeping first appointments was imperative. Reviewed things to call provider for or to just call 911 if needed and to not hesitate. Verbally demonstrated basic understanding of when/how long to wear [employer] LifeVest, and again advised to revisit education material given, who to call, and to call providers or 911 if needed. Patient awake, alert, engaged. Two family members present to transport patient home. Gabriel Cirri RN CM    Final next level of care: Other (comment) (Outpatient CP rehab.) Barriers to Discharge: Barriers Resolved   Patient Goals and CMS Choice        Discharge Placement                       Discharge Plan and Services                DME Arranged: Life vest DME Agency: [employer] (Patient already has and is wearing it. Patient education material with patient. Demonstrated verbal understanding.)       HH Arranged: NA HH Agency: NA        Social Determinants of Health (SDOH) Interventions     Readmission Risk Interventions No flowsheet data found.

## 2020-09-13 NOTE — Discharge Instructions (Signed)
Wear your Life vest

## 2020-09-13 NOTE — Discharge Summary (Addendum)
[facility] [place] at [facility]   PATIENT NAME: Adam Carter    MR#:  [id number]  DATE OF BIRTH:  09/15/1975  DATE OF ADMISSION:  09/07/2020 ADMITTING PHYSICIAN: Delfino Lovett, MD  DATE OF DISCHARGE: 09/13/2020  PRIMARY CARE PHYSICIAN: System, Provider Not In    ADMISSION DIAGNOSIS:  Cardiac arrest (HCC) [I46.9] ST elevation myocardial infarction (STEMI), unspecified artery (HCC) [I21.3]  DISCHARGE DIAGNOSIS:  Acute Antero-lateral STEMI S/p PCI/DES to LAD V.Fib arrest/Cardiac arrest s/p life vest Aspiration Pneumonia Cardiogenic Shock Acute Systolic CHF/Severe ischemic cardiomyopathy --EF 25-35%  SECONDARY DIAGNOSIS:  No past medical history on file.  HOSPITAL COURSE:   45 yo male without of hospital cardiac arrest admitted withanteriorSTEMI, Acute Hypoxic Respiratory Failure due to ischemic cardiomyopathy and Acute Decompensated HFrEF.  3/14 acute MI s/p cardiac arrest s/p CATH 3/15 remains critically ill 3/16 failed SBT 3/17: extubated and off precedex 3/18: [facility] assumed care. Transferred to PCU  3/19: overall stable. Life vest placed today. 3/20: doing well. Mother in the room. No arrythmia recorded  Out of hospital cardiac arrest on 3/14 Anterolateral STEMI V. fib arrest CAD --S/p cath showing occluded ostial LAD and successful PCI/DES to the ostial LAD --EF of 25 to 35% --Continue aspirin and ticagrelor for next 12 months per cardiology --Continue high intensity statin -- LifeVest/zoll monitor placed. --Cardiac rehab at discharge --cont low dose losartan. BB not started due to intermittent bradycardia  Acute hypoxic respiratory failure Aspiration pneumonia --Requiring intubation on admission, extubated on 3/17 --Now on 3 L oxygen via nasal cannula--now on RA 95% --IV Unasyn--change to po for total 5 days--completed  Cardiogenic shock Required pressors early on. Off pressors now--BP remains stable  Acute systolic CHF due to  ischemic cardiomyopathy --EF of 25 to 35% --received IV Lasix x1 today --cont lasix 20 mg po daily  Hyperlipidemia Lipitor   Overall improving slowly. Discussed with patient's  and mother in the room his discharge plan. Ok from [facility] cardiology standpoint for discharge  Status is: Inpatient  Dispo: The patient is from:Home Anticipated d/c is ZD:GLOV Patient currently is  medically stable.  CONSULTS OBTAINED:  Treatment Team:  Iran Ouch, MD  DRUG ALLERGIES:  Not on File  DISCHARGE MEDICATIONS:   Allergies as of 09/13/2020   Not on File     Medication List    TAKE these medications   aspirin 81 MG chewable tablet Chew 1 tablet (81 mg total) by mouth daily. Start taking on: September 14, 2020   atorvastatin 80 MG tablet Commonly known as: LIPITOR Take 1 tablet (80 mg total) by mouth daily at 6 PM.   famotidine 20 MG tablet Commonly known as: PEPCID Take 1 tablet (20 mg total) by mouth daily. Start taking on: September 14, 2020   feeding supplement Liqd Take 237 mLs by mouth 3 (three) times daily between meals.   furosemide 20 MG tablet Commonly known as: LASIX Take 1 tablet (20 mg total) by mouth daily. Start taking on: September 14, 2020   guaiFENesin 600 MG 12 hr tablet Commonly known as: MUCINEX Take 1 tablet (600 mg total) by mouth 2 (two) times daily.   losartan 25 MG tablet Commonly known as: COZAAR Take 0.5 tablets (12.5 mg total) by mouth daily. Start taking on: September 14, 2020   multivitamin with minerals Tabs tablet Take 1 tablet by mouth daily. Start taking on: September 14, 2020   pregabalin 75 MG capsule Commonly known as: LYRICA Take 75 mg by mouth 3 (  three) times daily.   ticagrelor 90 MG Tabs tablet Commonly known as: BRILINTA Take 1 tablet (90 mg total) by mouth 2 (two) times daily.            Durable Medical Equipment  (From admission, onward)         Start     Ordered   09/11/20  0918  For home use only DME Vest life vest  Once       Comments: Start date 09/11/2020 Duration: 3 months Indication: ventricular fibrillation arrest, anterior ST elevation myocardial infarction, acute systolic congestive heart failure an an EF of 35%  Please assist with financial coverage as he does not have insurance   09/11/20 0917          If you experience worsening of your admission symptoms, develop shortness of breath, life threatening emergency, suicidal or homicidal thoughts you must seek medical attention immediately by calling 911 or calling your MD immediately  if symptoms less severe.  You Must read complete instructions/literature along with all the possible adverse reactions/side effects for all the Medicines you take and that have been prescribed to you. Take any new Medicines after you have completely understood and accept all the possible adverse reactions/side effects.   Please note  You were cared for by a hospitalist during your hospital stay. If you have any questions about your discharge medications or the care you received while you were in the hospital after you are discharged, you can call the unit and asked to speak with the hospitalist on call if the hospitalist that took care of you is not available. Once you are discharged, your primary care physician will handle any further medical issues. Please note that NO REFILLS for any discharge medications will be authorized once you are discharged, as it is imperative that you return to your primary care physician (or establish a relationship with a primary care physician if you do not have one) for your aftercare needs so that they can reassess your need for medications and monitor your lab values. Today   SUBJECTIVE   Doing well. Walking in the room. No cp. Mother int he room. Answered her questions  VITAL SIGNS:  Blood pressure 106/72, pulse 68, temperature 99.1 F (37.3 C), temperature source Oral, resp. rate 16,  height 5' 7.01" (1.702 m), weight 88.2 kg, SpO2 95 %.  I/O:    Intake/Output Summary (Last 24 hours) at 09/13/2020 1040 Last data filed at 09/12/2020 2000 Gross per 24 hour  Intake 480 ml  Output 450 ml  Net 30 ml    PHYSICAL EXAMINATION:  GENERAL:  45 y.o.-year-old patient lying in the bed with no acute distress.  EYES: conjunctival ingestion HEENT: Head atraumatic, normocephalic. Oropharynx and nasopharynx clear.  LUNGS: Normal breath sounds bilaterally, no wheezing, rales,rhonchi or crepitation. No use of accessory muscles of respiration.  CARDIOVASCULAR: S1, S2 normal. No murmurs, rubs, or gallops.  ABDOMEN: Soft, non-tender, non-distended. Bowel sounds present. No organomegaly or mass.  EXTREMITIES: No pedal edema, cyanosis, or clubbing.  NEUROLOGIC: non focal PSYCHIATRIC: The patient is alert and oriented x 3.  SKIN: No obvious rash, lesion, or ulcer.   DATA REVIEW:   CBC  Recent Labs  Lab 09/12/20 0448  WBC 10.8*  HGB 10.7*  HCT 32.8*  PLT 236    Chemistries  Recent Labs  Lab 09/08/20 0131 09/08/20 0615 09/12/20 0448 09/13/20 0358  NA 137   < > 140 138  K 5.7*   < >  3.3* 3.7  CL 109   < > 110 110  CO2 23   < > 26 21*  GLUCOSE 138*   < > 110* 97  BUN 18   < > 20 19  CREATININE 1.25*   < > 0.92 0.78  CALCIUM 8.1*   < > 8.5* 8.4*  MG 1.7   < > 2.1  --   AST 291*  --   --   --   ALT 155*  --   --   --   ALKPHOS 87  --   --   --   BILITOT 0.9  --   --   --    < > = values in this interval not displayed.    Microbiology Results   Recent Results (from the past 240 hour(s))  Resp Panel by RT-PCR (Flu A&B, Covid) Nasopharyngeal Swab     Status: None   Collection Time: 09/07/20  3:41 PM   Specimen: Nasopharyngeal Swab; Nasopharyngeal(NP) swabs in vial transport medium  Result Value Ref Range Status   SARS Coronavirus 2 by RT PCR NEGATIVE NEGATIVE Final    Comment: (NOTE) SARS-CoV-2 target nucleic acids are NOT DETECTED.  The SARS-CoV-2 RNA is  generally detectable in upper respiratory specimens during the acute phase of infection. The lowest concentration of SARS-CoV-2 viral copies this assay can detect is 138 copies/mL. A negative result does not preclude SARS-Cov-2 infection and should not be used as the sole basis for treatment or other patient management decisions. A negative result may occur with  improper specimen collection/handling, submission of specimen other than nasopharyngeal swab, presence of viral mutation(s) within the areas targeted by this assay, and inadequate number of viral copies(<138 copies/mL). A negative result must be combined with clinical observations, patient history, and epidemiological information. The expected result is Negative.  Fact Sheet for Patients:  BloggerCourse.com  Fact Sheet for Healthcare Providers:  SeriousBroker.it  This test is no t yet approved or cleared by the [place] FDA and  has been authorized for detection and/or diagnosis of SARS-CoV-2 by FDA under an Emergency Use Authorization (EUA). This EUA will remain  in effect (meaning this test can be used) for the duration of the COVID-19 declaration under Section 564(b)(1) of the Act, 21 U.S.C.section 360bbb-3(b)(1), unless the authorization is terminated  or revoked sooner.       Influenza A by PCR NEGATIVE NEGATIVE Final   Influenza B by PCR NEGATIVE NEGATIVE Final    Comment: (NOTE) The Xpert Xpress SARS-CoV-2/FLU/RSV plus assay is intended as an aid in the diagnosis of influenza from Nasopharyngeal swab specimens and should not be used as a sole basis for treatment. Nasal washings and aspirates are unacceptable for Xpert Xpress SARS-CoV-2/FLU/RSV testing.  Fact Sheet for Patients: BloggerCourse.com  Fact Sheet for Healthcare Providers: SeriousBroker.it  This test is not yet approved or cleared by the [place] FDA and has been authorized for detection and/or diagnosis of SARS-CoV-2 by FDA under an Emergency Use Authorization (EUA). This EUA will remain in effect (meaning this test can be used) for the duration of the COVID-19 declaration under Section 564(b)(1) of the Act, 21 U.S.C. section 360bbb-3(b)(1), unless the authorization is terminated or revoked.  Performed at [facility], [address] [place], [place] [address]   MRSA PCR Screening     Status: None   Collection Time: 09/07/20  6:32 PM   Specimen: Nasopharyngeal Swab  Result Value Ref Range Status   MRSA by PCR  NEGATIVE NEGATIVE Final    Comment:        The GeneXpert MRSA Assay (FDA approved for NASAL specimens only), is one component of a comprehensive MRSA colonization surveillance program. It is not intended to diagnose MRSA infection nor to guide or monitor treatment for MRSA infections. Performed at [facility], [address] [place], [place] [address]   CULTURE, BLOOD (ROUTINE X 2) w Reflex to ID Panel     Status: None   Collection Time: 09/08/20  1:31 AM   Specimen: BLOOD  Result Value Ref Range Status   Specimen Description BLOOD BLOOD LEFT FOREARM  Final   Special Requests   Final    BOTTLES DRAWN AEROBIC AND ANAEROBIC Blood Culture adequate volume   Culture   Final    NO GROWTH 5 DAYS Performed at [facility], [address] [place], [place] [address]    Report Status 09/13/2020 FINAL  Final  CULTURE, BLOOD (ROUTINE X 2) w Reflex to ID Panel     Status: None   Collection Time: 09/08/20  1:31 AM   Specimen: BLOOD  Result Value Ref Range Status   Specimen Description BLOOD BLOOD LEFT HAND  Final   Special Requests   Final    BOTTLES DRAWN AEROBIC AND ANAEROBIC Blood Culture results may not be optimal due to an inadequate volume of blood received in culture bottles   Culture   Final    NO GROWTH 5 DAYS Performed at [facility], [address],  [place], [place] [address]    Report Status 09/13/2020 FINAL  Final  Culture, Respiratory w Gram Stain     Status: None   Collection Time: 09/08/20  5:38 AM   Specimen: Tracheal Aspirate; Respiratory  Result Value Ref Range Status   Specimen Description   Final    TRACHEAL ASPIRATE Performed at [facility], [address] [place], [place] [address]    Special Requests   Final    NONE Performed at [facility], [address] [place], [place] [address]    Gram Stain   Final    ABUNDANT WBC PRESENT,BOTH PMN AND MONONUCLEAR MODERATE GRAM POSITIVE COCCI IN PAIRS IN CHAINS RARE GRAM NEGATIVE COCCOBACILLI RARE BUDDING YEAST SEEN    Culture   Final    FEW Consistent with normal respiratory flora. No Pseudomonas species isolated Performed at [facility], [address], [place], [place] [address]    Report Status 09/10/2020 FINAL  Final    RADIOLOGY:  DG Chest Port 1 View  Result Date: 09/12/2020 CLINICAL DATA:  Status post cardiac arrest. EXAM: PORTABLE CHEST 1 VIEW COMPARISON:  Chest radiograph dated 09/11/2020. FINDINGS: The heart size and mediastinal contours are within normal limits. There is a mild bilateral atelectasis/airspace disease. Small pleural effusions likely contribute. There is no pneumothorax. There is no pneumothorax. The visualized skeletal structures are unremarkable. A left internal jugular central venous catheter tip overlies the superior cavoatrial junction. IMPRESSION: Mild bilateral atelectasis/airspace disease. Small pleural effusions likely contribute. Electronically Signed   By: Romona Curls M.D.   On: 09/12/2020 15:18     CODE STATUS:  Code Status History    Date Active Date Inactive Code Status Order ID Comments User Context   09/07/2020 1825 09/10/2020 1202 Full Code [id number]  Iran Ouch, MD Inpatient   09/07/2020 1720 09/07/2020 1825 Full Code [id number]  Erin Fulling, MD Inpatient   Advance Care Planning Activity     Questions  for Most Recent Historical Code Status (Order [id number])        TOTAL TIME TAKING CARE OF THIS PATIENT: *40* minutes.    Enedina Finner M.D  [facility]    CC: Primary care physician; System, Provider Not In

## 2020-09-14 ENCOUNTER — Telehealth: Payer: Self-pay | Admitting: Physician Assistant

## 2020-09-14 NOTE — Telephone Encounter (Signed)
Patient needs hospital follow up 

## 2020-09-14 NOTE — Telephone Encounter (Signed)
Patient contacted regarding discharge from [facility] on 09/13/20.  Patient understands to follow up with provider JV on 09/16/20 at 11 AM at [facility]. Patient understands discharge instructions? Yes Patient understands medications and regiment? Yes Patient understands to bring all medications to this visit? Yes  Confirmed appointment with patient and he verbalized understanding with no further questions at this time.

## 2020-09-16 ENCOUNTER — Telehealth: Payer: Self-pay | Admitting: Licensed Clinical Social Worker

## 2020-09-16 ENCOUNTER — Other Ambulatory Visit
Admission: RE | Admit: 2020-09-16 | Discharge: 2020-09-16 | Disposition: A | Discharge status: Home / Self Care | Payer: No Typology Code available for payment source | Source: Ambulatory Visit | Attending: Physician Assistant | Admitting: Physician Assistant

## 2020-09-16 ENCOUNTER — Ambulatory Visit: Payer: No Typology Code available for payment source | Admitting: Physician Assistant

## 2020-09-16 ENCOUNTER — Other Ambulatory Visit: Payer: Self-pay

## 2020-09-16 ENCOUNTER — Encounter: Payer: Self-pay | Admitting: Physician Assistant

## 2020-09-16 VITALS — BP 92/62 | HR 77 | Ht 67.0 in | Wt 182.0 lb

## 2020-09-16 DIAGNOSIS — I2102 ST elevation (STEMI) myocardial infarction involving left anterior descending coronary artery: Secondary | ICD-10-CM | POA: Insufficient documentation

## 2020-09-16 DIAGNOSIS — I252 Old myocardial infarction: Secondary | ICD-10-CM

## 2020-09-16 DIAGNOSIS — I5020 Unspecified systolic (congestive) heart failure: Secondary | ICD-10-CM | POA: Diagnosis not present

## 2020-09-16 DIAGNOSIS — I2510 Atherosclerotic heart disease of native coronary artery without angina pectoris: Secondary | ICD-10-CM

## 2020-09-16 DIAGNOSIS — Z8674 Personal history of sudden cardiac arrest: Secondary | ICD-10-CM

## 2020-09-16 DIAGNOSIS — Z720 Tobacco use: Secondary | ICD-10-CM

## 2020-09-16 DIAGNOSIS — E785 Hyperlipidemia, unspecified: Secondary | ICD-10-CM

## 2020-09-16 LAB — LIPID PANEL
Cholesterol: 117 mg/dL (ref 0–200)
HDL: 26 mg/dL — ABNORMAL LOW (ref >40)
LDL Cholesterol: 70 mg/dL (ref 0–99)
Total CHOL/HDL Ratio: 4.5 RATIO
Triglycerides: 106 mg/dL (ref <150)
VLDL: 21 mg/dL (ref 0–40)

## 2020-09-16 LAB — BASIC METABOLIC PANEL
Anion gap: 8 (ref 5–15)
BUN: 13 mg/dL (ref 6–20)
CO2: 24 mmol/L (ref 22–32)
Calcium: 8.8 mg/dL — ABNORMAL LOW (ref 8.9–10.3)
Chloride: 104 mmol/L (ref 98–111)
Creatinine, Ser: 0.98 mg/dL (ref 0.61–1.24)
GFR, Estimated: >60 mL/min (ref >60)
Glucose, Bld: 106 mg/dL — ABNORMAL HIGH (ref 70–99)
Potassium: 4.3 mmol/L (ref 3.5–5.1)
Sodium: 136 mmol/L (ref 135–145)

## 2020-09-16 LAB — CBC
HCT: 40.7 % (ref 39.0–52.0)
Hemoglobin: 13.6 g/dL (ref 13.0–17.0)
MCH: 30.9 pg (ref 26.0–34.0)
MCHC: 33.4 g/dL (ref 30.0–36.0)
MCV: 92.5 fL (ref 80.0–100.0)
Platelets: 393 10*3/uL (ref 150–400)
RBC: 4.4 MIL/uL (ref 4.22–5.81)
RDW: 12.9 % (ref 11.5–15.5)
WBC: 12.1 10*3/uL — ABNORMAL HIGH (ref 4.0–10.5)
nRBC: 0 % (ref 0.0–0.2)

## 2020-09-16 LAB — HEPATIC FUNCTION PANEL
ALT: 33 U/L (ref 0–44)
AST: 24 U/L (ref 15–41)
Albumin: 3.8 g/dL (ref 3.5–5.0)
Alkaline Phosphatase: 87 U/L (ref 38–126)
Bilirubin, Direct: 0.1 mg/dL (ref 0.0–0.2)
Indirect Bilirubin: 1.2 mg/dL — ABNORMAL HIGH (ref 0.3–0.9)
Total Bilirubin: 1.3 mg/dL — ABNORMAL HIGH (ref 0.3–1.2)
Total Protein: 7.2 g/dL (ref 6.5–8.1)

## 2020-09-16 LAB — BRAIN NATRIURETIC PEPTIDE: B Natriuretic Peptide: 119.7 pg/mL — ABNORMAL HIGH (ref 0.0–100.0)

## 2020-09-16 MED ORDER — PANTOPRAZOLE SODIUM 20 MG PO TBEC: 20.0000 mg | DELAYED_RELEASE_TABLET | Freq: Every day | ORAL | 5 refills | Status: DC

## 2020-09-16 MED ORDER — PANTOPRAZOLE SODIUM 20 MG PO TBEC: 20.0000 mg | DELAYED_RELEASE_TABLET | Freq: Every day | ORAL | 1 refills | Status: DC

## 2020-09-16 NOTE — Progress Notes (Addendum)
Office Visit    Patient Name: Adam Carter Date of Encounter: 09/16/2020  PCP:  System, Provider Not In   [facility]  Cardiologist:  Lorine Bears, MD  Advanced Practice Provider:  No care team member to display Electrophysiologist:  None :[phone]}   Chief Complaint    Chief Complaint  Patient presents with  . Follow-up    After hospitalization for STEMI and LHC w/ PCI/Angioplasty   45 year old male with history of VF arrest / cardiac arrest and anterior lateral ST elevation myocardial infarction s/p PCI/DES to occluded ostial LAD 09/07/2020 with clinical course been complicated by aspiration pneumonia, acute on chronic HFrEF/ICM with EF 25 to 35% by LV gram and 30-35% by echo, tobacco use, and seen today for hospital follow-up.  Past Medical History    Past Medical History:  Diagnosis Date  . Coronary artery disease    occluded ostial LAD with otherwise no obstructive disease.   PCI/DES to ostial LAD  . HFrEF (heart failure with reduced ejection fraction) (HCC)    EF 30-35%  . History of cardiac arrest    VF arrest 3/14  . History of ST elevation myocardial infarction (STEMI)    09/07/20 anteriolateral STEMI  . History of tobacco use   . History of ventricular fibrillation    3/14  . Hyperlipidemia LDL goal <70    Past Surgical History:  Procedure Laterality Date  . CORONARY/GRAFT ACUTE MI REVASCULARIZATION N/A 09/07/2020   Procedure: Coronary/Graft Acute MI Revascularization;  Surgeon: Iran Ouch, MD;  Location: [facility] INVASIVE CV LAB;  Service: Cardiovascular;  Laterality: N/A;  . RIGHT/LEFT HEART CATH AND CORONARY ANGIOGRAPHY N/A 09/07/2020   Procedure: RIGHT/LEFT HEART CATH AND CORONARY ANGIOGRAPHY;  Surgeon: Iran Ouch, MD;  Location: [facility] INVASIVE CV LAB;  Service: Cardiovascular;  Laterality: N/A;    Allergies  Not on File  History of Present Illness    Adam Carter is a 45 y.o. male with PMH as above.  Family history  includes uncle and father that may have had a heart attack. He had reportedly been dealing with cervical spine issues and had an injection done about a week ago.  He reported intermittent chest pain over the weekend but did not seek medical attention.  He went to work the day of his cardiac event and reported some mild chest discomfort initially, but then after lunch he reported severe chest pain followed by collapse.  Coworkers witnessed the collapse and did CPR.  When EMS arrived, he was in ventricular fibrillation.  He received 2 shocks and a single dose of epinephrine and had ROSC.  Post arrest EKG showed anterior ST elevation myocardial infarction.  STEMI code was activated from the field.  The patient was brought to the ED and his airway was secured with a ET tube.  He had an IV placed and was given heparin.  Emergent catheterization was performed.  Echo showed LVEF 30 to 35%, regional wall motion abnormalities of LV, mild LVH, moderate hypokinesis of the left ventricular, mid apical, anteroseptal wall, anterior wall, and anterior lateral wall.  LGS -5.3%.  Catheterization was performed that showed occluded ostial LAD with otherwise no obstructive disease.  Successful PCI/DES was performed to the ostial LAD.  High-sensitivity troponin peaked at 27,000.  He was extubated and off of nor epi as of 3/14.  He was started on low-dose losartan 12.5 mg daily.  He was fitted with LifeVest.  Brilinta card given to patient.  Given  his bradycardia, beta-blocker was not added.  He was started on oral Lasix 20 mg daily.  Due to aspiration pneumonia/leukocytosis/fever, he was on antibiotics.  Today, 09/16/2020, he returns to clinic and notes he is overall doing well from a cardiac standpoint.  He does not remember much leading up to the events of his cardiac arrest or during his hospitalization. No CP like before his admission today, though he is sore in his chest. He is also TTP on exam (and reports he is TTP at home).  He wonders if this is 2/2 CPR and defibrillation. He does feel it is improving since discharge.  He at times feels a dizzy spell, which he describes as not quite a dizzy spell but more a burst of energy.  This usually resolves with walking.  He has been trying to take it easy but does not sit still well. He reports some orthopnea and that he is coughing up white and clear fluid. He reports cough is different from pna and worse with laying down. He feels as if he is holding onto volume. He reports adequate urine output on his current lasix, urinating 3-5 times per day. No SOB at rest or with exertion thus far. He still has a bandage on his neck from his hospitalization and wonders if he should remove it (removed today). Reports that he has been wearing his LifeVest as directed.  He received an extra box that he believes is an additional LifeVest with instructions that I will reach out to the LifeVest representative with further instructions at that time.  He has not smoked any cigarettes since discharge.  Previously, he was smoking a pack and half per day.  He has not yet returned to work with recommendation to stay out of work until cardiology has deemed him acceptable to return.  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  We discussed completion of cardiac rehab as a good transition to increase activity before resuming to his job.  He is currently drinking 1 beer every 3 to 4 months.  We discussed eliminating both alcohol and tobacco from his lifestyle moving forward.  No drug use in the last 20 years.   Does have a history of GERD and is currently on OTC GERD medications with recommendations as below.  No signs or symptoms of bleeding.  We reviewed his cath report, echo, and GDMT planned going forward, as well as the plan after his cardiac event as above. BP noted to be low at his visit with pt report that he is not currently dizzy, despite  low BP. Of note, he does have a blister on his foot (heel) and wonders what to do with this with recommendation to present to his PCP or podiatry. He reports that he is completing disability paperwork, though does not have this with him at this time.  Home Medications    Current Outpatient Medications on File Prior to Visit  Medication Sig Dispense Refill  . aspirin 81 MG chewable tablet Chew 1 tablet (81 mg total) by mouth daily. 30 tablet 2  . atorvastatin (LIPITOR) 80 MG tablet Take 1 tablet (80 mg total) by mouth daily at 6 PM. 30 tablet 3  . famotidine (PEPCID) 20 MG tablet Take 1 tablet (20 mg total) by mouth daily. 30 tablet 0  . furosemide (LASIX) 20 MG tablet Take 1 tablet (20 mg total) by mouth daily. 30 tablet 0  .  guaiFENesin (MUCINEX) 600 MG 12 hr tablet Take 1 tablet (600 mg total) by mouth 2 (two) times daily. 14 tablet 0  . losartan (COZAAR) 25 MG tablet Take 0.5 tablets (12.5 mg total) by mouth daily. 30 tablet 3  . Multiple Vitamin (MULTIVITAMIN WITH MINERALS) TABS tablet Take 1 tablet by mouth daily. 30 tablet 0  . pregabalin (LYRICA) 75 MG capsule Take 75 mg by mouth 3 (three) times daily.    . ticagrelor (BRILINTA) 90 MG TABS tablet Take 1 tablet (90 mg total) by mouth 2 (two) times daily. 60 tablet 3   No current facility-administered medications on file prior to visit.    Review of Systems    He denies chest pain like before admission but does note chest soreness / TTP as above and that he attributes to possible CPR or possibly defibrillation. No palpitations, dyspnea, pnd, n, v, syncope, edema, weight gain, or early satiety.  He reports some symptoms consistent with orthopnea and intermittent dizziness, as well as that he is coughing up clear fluid. He reports adequate energy. He has the left neck bandage still in place and is wearing his lifevest. No s/sx of bleeding. He does not remember the events before and after admission. He has a foot blister.   All other systems  reviewed and are otherwise negative except as noted above.  Physical Exam    VS:  BP 92/62   Pulse 77   Ht  (1.702 m)   Wt 182 lb (82.6 kg)   BMI 28.51 kg/m  , BMI Body mass index is 28.51 kg/m. GEN: Well nourished, well developed, in no acute distress. HEENT: bilateral erythema of eyes, normal. Neck: Supple, no JVD, carotid bruits, or masses. L bandage on neck s/p recent admission (removed today). Cardiac: Lifevest in place. RRR, no murmurs, rubs, or gallops. No clubbing, cyanosis. Mild bilateral nonpitting edema.  Radials/DP/PT 2+ and equal bilaterally. TTP when pressing on his chest today.  Respiratory:  Respirations regular and unlabored, trace bibasilar crackles. GI: Soft, nontender, nondistended, BS + x 4. MS: no deformity or atrophy. Skin: foot blister but otherwise warm and dry, no rash. Neuro:  Strength and sensation are intact. Psych: Normal affect.  Accessory Clinical Findings    ECG personally reviewed by me today -NSR, 77 bpm, PR interval 152, QTc 416 T wave inversion noted iV2, V3, V4, 1, aVL- no acute changes.  VITALS Reviewed today   Temp Readings from Last 3 Encounters:  09/13/20 99.1 F (37.3 C) (Oral)   BP Readings from Last 3 Encounters:  09/16/20 92/62  09/13/20 106/72  09/07/20 (!) 128/91   Pulse Readings from Last 3 Encounters:  09/16/20 77  09/13/20 68  09/07/20 (!) 123    Wt Readings from Last 3 Encounters:  09/16/20 182 lb (82.6 kg)  09/13/20 194 lb 7.1 oz (88.2 kg)     LABS  reviewed today   Lab Results  Component Value Date   WBC 10.8 (H) 09/12/2020   HGB 10.7 (L) 09/12/2020   HCT 32.8 (L) 09/12/2020   MCV 94.3 09/12/2020   PLT 236 09/12/2020   Lab Results  Component Value Date   CREATININE 0.78 09/13/2020   BUN 19 09/13/2020   NA 138 09/13/2020   K 3.7 09/13/2020   CL 110 09/13/2020   CO2 21 (L) 09/13/2020   Lab Results  Component Value Date   ALT 155 (H) 09/08/2020   AST 291 (H) 09/08/2020   ALKPHOS 87 09/08/2020  BILITOT 0.9 09/08/2020   Lab Results  Component Value Date   CHOL 126 09/09/2020   HDL 34 (L) 09/09/2020   LDLCALC 75 09/09/2020   TRIG 176 (H) 09/11/2020   CHOLHDL 3.7 09/09/2020    Lab Results  Component Value Date   HGBA1C 6.0 (H) 09/08/2020   No results found for: TSH   STUDIES/PROCEDURES reviewed today   Right and left heart catheterization 09/07/2020  The left ventricular ejection fraction is 25-35% by visual estimate.  There is moderate to severe left ventricular systolic dysfunction.  LV end diastolic pressure is moderately elevated.  Ost LAD to Prox LAD lesion is 100% stenosed.  Post intervention, there is a 0% residual stenosis.  A drug-eluting stent was successfully placed using a STENT RESOLUTE ONYX 3.5X12.  Mid LAD lesion is 30% stenosed. 1.  Out of hospital cardiac arrest with anterior ST elevation myocardial infarction due to ostial occlusion of the LAD.  No other obstructive disease. 2.  Moderately severely reduced LV systolic function with an EF of 25 to 30% with severe anterior/apical hypokinesis. 3.  Successful angioplasty and drug-eluting stent placement to the ostial LAD. 4.  Right heart catheterization was performed after PCI and showed mildly elevated filling pressures, mild pulmonary hypertension and normal cardiac output.  PA sat was 78.7%.  Fick cardiac output was 6.17 with a cardiac index of 3.5.  CPO was 1.4.  Based on these hemodynamic numbers, no hemodynamic support was utilized. Recommendations: Dual antiplatelet therapy for at least 1 year. High-dose atorvastatin. We will run Aggrastat infusion for 12 hours given uncertainty of oral absorption of antiplatelet medications. The patient had significant agitation and this will need to be managed.  He is currently on propofol drip and fentanyl drip.  I discussed with Dr. Belia HemanKasa. Recommend electrolyte replacement.  Critical care time of 45 minutes managing abnormal hemodynamics, ventilator  management and electrolytes. There is a 0% residual stenosis post intervention.    Wall Motion  Resting                Left Heart  Left Ventricle The left ventricular size is normal. There is moderate to severe left ventricular systolic dysfunction. LV end diastolic pressure is moderately elevated. The left ventricular ejection fraction is 25-35% by visual estimate. There are LV function abnormalities due to segmental dysfunction.   Coronary Diagrams   Diagnostic Dominance: Right    Intervention       Echocardiogram 09/08/2020 1. Left ventricular ejection fraction, by estimation, is 30 to 35%. The  left ventricle has moderately decreased function. The left ventricle  demonstrates regional wall motion abnormalities (see scoring  diagram/findings for description). There is mild  left ventricular hypertrophy. Left ventricular diastolic parameters were  normal. There is moderate hypokinesis of the left ventricular, mid-apical  anteroseptal wall, anterior wall and anterolateral wall. The average left  ventricular global longitudinal  strain is -5.3 %. The global longitudinal strain is abnormal.  2. Right ventricular systolic function is normal. The right ventricular  size is normal. Tricuspid regurgitation signal is inadequate for assessing  PA pressure.  3. The mitral valve is normal in structure. No evidence of mitral valve  regurgitation. No evidence of mitral stenosis.  4. The aortic valve is normal in structure. Aortic valve regurgitation is  not visualized. No aortic stenosis is present.   Assessment & Plan    Coronary artery disease History of anterior lateral ST elevation myocardial infarction History of VF arrest/cardiac arrest --No chest pain or symptoms  similar to that before his 3/14 presentation.  He does report chest soreness that is TTP and likely 2/2 defibrillation / CPR. He notes improvement in this tenderness but will call the office if it does  not resolve. He underwent emergent LHC PCI/DES to the ostial LAD.  LV gram demonstrated EF 25 to 35% with follow-up echo showing EF 30 to 35%.  BP soft today, preventing escalation of GDMT.  Continue current medications and readdress escalation of GDMT at follow-up.  Continue DAPT with ASA and Brilinta.  Will start on Protonix for protection of his stomach and have him discontinue Pepcid AC.  No s/sx of bleeding. Will obtain a CBC. No beta-blocker due to bradycardia and hypotension after recent admission with cardiogenic shock.  As below, we will have him hold his losartan when we increased diuresis for 3 days and then restart his losartan at that time.  Given his questions regarding his second box from the [employer], I will reach out to the [employer].  He has already started cardiac rehab with recommendation to continue.  LHC, echo reviewed in detail.  Reviewed heart healthy diet and lifestyle changes with patient understanding.  Ongoing smoking cessation encouraged. BP checks recommended and will contact SW for cuff.  Acute on chronic HFrEF (EF 30-35%) History of cardiogenic shock --BP today soft/hypotensive at 92/62.  He is also volume up with recommendation to hold his losartan for now to allow for 3 days of increased diuresis with Lasix 40 mg daily (x3 days) after which time he can resume his losartan and drop back down to Lasix 20 mg daily.  Given his bradycardia during admission, beta-blocker was not added at that time.  Will defer addition of beta-blocker today given his low BP.  He will monitor his BP at home. Will reach out to social worker to ensure he has a BP cuff.  As above, escalation of GDMT as BP and heart rate allow.  Will obtain a BMET today and in 1 to 2 weeks to reassess renal function and electrolytes, especially given plan for increased diuresis x3 days. Recommend follow-up echo once optimized GDMT or in 3 to 6 months.  CHF education provided today.     Hyperlipidemia, LDL goal less than 70 --Continue atorvastatin 80 mg daily.  LDL 75 during admission.  Recommend repeat lipid and liver function in 6 to 8 weeks from start of atorvastatin.  History of recent aspiration pneumonia --Denies any residual symptoms from aspiration pneumonia.  We will stop his Mucinex.  Recommended Coricidin for any future head colds or respiratory infections, given this is recommended for all cardiovascular patients.  History of hypokalemia, h/o anemia --Seen during admission.  Most recent hemoglobin 10.7 with hematocrit 32.8.  Most recent potassium 3.7.  Repeat a BMET / CBC as above to confirm resolution of previous low K+, H&H.   History of tobacco use --Ongoing cessation recommended.   Foot blister --Per PCP/podiatry.  Transaminitis  --Previous ALT 155 and AST 291.  Will repeat liver function to reassess in the setting of ongoing high intensity statin.  Elevated A1C --3/15 A1c 6.0.  Reviewed diet and lifestyle. Recommend follow-up with PCP. Consider Farxiga at RTC.  Lifevest Questions --Will reach out to the appropriate LifeVest representative.  Disability paperwork --Does not have disability paperwork with him but agreeable to bring to our office if needed.  Medication changes: Hold losartan for plan to increase lasix x3 days (lasix  x3 days) then resume losartan when dropping back down  to previous lasix 20mg  daily Labs ordered: BMET today, 1-2 weeks, BNP, CBC, lipid and liver in 6-8 weeks Studies / Imaging ordered: None. Continue cardiac rehab. Will reach out to LifeVest representative. Will reach out to social worker regarding obtaining a BP cuff. Future considerations: echo in ~3-6 months. Escalation of GDMT once vitals allow.  Disposition: RTC 1 week for reassessment     , PA-C 09/16/2020

## 2020-09-16 NOTE — Telephone Encounter (Signed)
CSW referred to assist patient with obtaining a BP cuff. CSW contacted patient to inform cuff will be delivered to home. Patient grateful for support and assistance. CSW available as needed. Jackie Brennan, LCSW, CCSW-MCS [phone]  

## 2020-09-16 NOTE — Patient Instructions (Addendum)
Medication Instructions:  Your physician has recommended you make the following change in your medication:   STOP taking Losartan - we will call after lab work regarding medication changes  STOP Mucinex   STOP Pepcid   START Pantoprazole (Protonix) 20mg  DAILY   *If you need a refill on your cardiac medications before your next appointment, please call your pharmacy*   Lab Work:  1) Your physician recommends that you have lab work TODAY: Bmet, CBC, BNP -  Please go to the [facility]. You will check in at the front desk to the right as you walk into the atrium.   2) Your physician recommends that you return for FASTING lab work to the [facility] at [facility] in 6-8 weeks: Lipid/ Liver panel -  Please go to the [facility]. You will check in at the front desk to the right as you walk into the atrium. Valet Parking is offered if needed. - No appointment needed. You may go any day between 7 am and 6 pm.    Testing/Procedures:  Your physician has requested that you have an echocardiogram in 6 months. Echocardiography is a painless test that uses sound waves to create images of your heart. It provides your doctor with information about the size and shape of your heart and how well your heart's chambers and valves are working. This procedure takes approximately one hour. There are no restrictions for this procedure.    Follow-Up: At [facility], you and your health needs are our priority.  As part of our continuing mission to provide you with exceptional heart care, we have created designated Provider Care Teams.  These Care Teams include your primary Cardiologist (physician) and Advanced Practice Providers (APPs -  Physician Assistants and Nurse Practitioners) who all work together to provide you with the care you need, when you need it.  We recommend signing up for the patient portal called "MyChart".  Sign up information is provided on this After Visit Summary.  MyChart is  used to connect with patients for Virtual Visits (Telemedicine).  Patients are able to view lab/test results, encounter notes, upcoming appointments, etc.  Non-urgent messages can be sent to your provider as well.   To learn more about what you can do with MyChart, go to [facility].    Your next appointment:    1-2 weeks   The format for your next appointment:   In Person  Provider:   You may see [email or web address], MD or one of the following Advanced Practice Providers on your designated Care Team:    Lorine Bears, NP  Nicolasa Ducking, PA-C  Eula Listen, PA-C  Cadence Ingenio, [place]  [place], NP    Other Instructions  We will follow up regarding the Zoll vest   We will contact someone regarding getting a blood pressure cuff for you   Mediterranean Diet A Mediterranean diet refers to food and lifestyle choices that are based on the traditions of countries located on the [place]. This way of eating has been shown to help prevent certain conditions and improve outcomes for people who have chronic diseases, like kidney disease and heart disease. What are tips for following this plan? Lifestyle  Cook and eat meals together with your family, when possible.  Drink enough fluid to keep your urine clear or pale yellow.  Be physically active every day. This includes: ? Aerobic exercise like running or swimming. ? Leisure activities like gardening, walking, or housework.  Get  7-8 hours of sleep each night.  If recommended by your health care provider, drink red wine in moderation. This means 1 glass a day for nonpregnant women and 2 glasses a day for men. A glass of wine equals 5 oz (150 mL). Reading food labels  Check the serving size of packaged foods. For foods such as rice and pasta, the serving size refers to the amount of cooked product, not dry.  Check the total fat in packaged foods. Avoid foods that have saturated fat or trans  fats.  Check the ingredients list for added sugars, such as corn syrup.   Shopping  At the grocery store, buy most of your food from the areas near the walls of the store. This includes: ? Fresh fruits and vegetables (produce). ? Grains, beans, nuts, and seeds. Some of these may be available in unpackaged forms or large amounts (in bulk). ? Fresh seafood. ? Poultry and eggs. ? Low-fat dairy products.  Buy whole ingredients instead of prepackaged foods.  Buy fresh fruits and vegetables in-season from local farmers markets.  Buy frozen fruits and vegetables in resealable bags.  If you do not have access to quality fresh seafood, buy precooked frozen shrimp or canned fish, such as tuna, salmon, or sardines.  Buy small amounts of raw or cooked vegetables, salads, or olives from the deli or salad bar at your store.  Stock your pantry so you always have certain foods on hand, such as olive oil, canned tuna, canned tomatoes, rice, pasta, and beans. Cooking  Cook foods with extra-virgin olive oil instead of using butter or other vegetable oils.  Have meat as a side dish, and have vegetables or grains as your main dish. This means having meat in small portions or adding small amounts of meat to foods like pasta or stew.  Use beans or vegetables instead of meat in common dishes like chili or lasagna.  Experiment with different cooking methods. Try roasting or broiling vegetables instead of steaming or sauteing them.  Add frozen vegetables to soups, stews, pasta, or rice.  Add nuts or seeds for added healthy fat at each meal. You can add these to yogurt, salads, or vegetable dishes.  Marinate fish or vegetables using olive oil, lemon juice, garlic, and fresh herbs. Meal planning  Plan to eat 1 vegetarian meal one day each week. Try to work up to 2 vegetarian meals, if possible.  Eat seafood 2 or more times a week.  Have healthy snacks readily available, such as: ? Vegetable sticks  with hummus. ? [place] yogurt. ? Fruit and nut trail mix.  Eat balanced meals throughout the week. This includes: ? Fruit: 2-3 servings a day ? Vegetables: 4-5 servings a day ? Low-fat dairy: 2 servings a day ? Fish, poultry, or lean meat: 1 serving a day ? Beans and legumes: 2 or more servings a week ? Nuts and seeds: 1-2 servings a day ? Whole grains: 6-8 servings a day ? Extra-virgin olive oil: 3-4 servings a day  Limit red meat and sweets to only a few servings a month   What are my food choices?  Mediterranean diet ? Recommended  Grains: Whole-grain pasta. Brown rice. Bulgar wheat. Polenta. Couscous. Whole-wheat bread. Orpah Cobb.  Vegetables: Artichokes. Beets. Broccoli. Cabbage. Carrots. Eggplant. Green beans. Chard. Kale. Spinach. Onions. Leeks. Peas. Squash. Tomatoes. Peppers. Radishes.  Fruits: Apples. Apricots. Avocado. Berries. Bananas. Cherries. Dates. Figs. Grapes. Lemons. Melon. Oranges. Peaches. Plums. Pomegranate.  Meats and other protein foods: Beans.  Almonds. Sunflower seeds. Pine nuts. Peanuts. Cod. Salmon. Scallops. Shrimp. Tuna. Tilapia. Clams. Oysters. Eggs.  Dairy: Low-fat milk. Cheese. Greek yogurt.  Beverages: Water. Red wine. Herbal tea.  Fats and oils: Extra virgin olive oil. Avocado oil. Grape seed oil.  Sweets and desserts: [place] yogurt with honey. Baked apples. Poached pears. Trail mix.  Seasoning and other foods: Basil. Cilantro. Coriander. Cumin. Mint. Parsley. Sage. Rosemary. Tarragon. Garlic. Oregano. Thyme. Pepper. Balsalmic vinegar. Tahini. Hummus. Tomato sauce. Olives. Mushrooms. ? Limit these  Grains: Prepackaged pasta or rice dishes. Prepackaged cereal with added sugar.  Vegetables: Deep fried potatoes (french fries).  Fruits: Fruit canned in syrup.  Meats and other protein foods: Beef. Pork. Lamb. Poultry with skin. Hot dogs. Tomasa Blase.  Dairy: Ice cream. Sour cream. Whole milk.  Beverages: Juice. Sugar-sweetened soft drinks.  Beer. Liquor and spirits.  Fats and oils: Butter. Canola oil. Vegetable oil. Beef fat (tallow). Lard.  Sweets and desserts: Cookies. Cakes. Pies. Candy.  Seasoning and other foods: Mayonnaise. Premade sauces and marinades. The items listed may not be a complete list. Talk with your dietitian about what dietary choices are right for you. Summary  The Mediterranean diet includes both food and lifestyle choices.  Eat a variety of fresh fruits and vegetables, beans, nuts, seeds, and whole grains.  Limit the amount of red meat and sweets that you eat.  Talk with your health care provider about whether it is safe for you to drink red wine in moderation. This means 1 glass a day for nonpregnant women and 2 glasses a day for men. A glass of wine equals 5 oz (150 mL). This information is not intended to replace advice given to you by your health care provider. Make sure you discuss any questions you have with your health care provider. Document Revised: 02/11/2016 Document Reviewed: 02/04/2016 Elsevier Patient Education  2020 [employer].

## 2020-09-17 ENCOUNTER — Telehealth: Payer: Self-pay | Admitting: Cardiovascular Disease

## 2020-09-17 ENCOUNTER — Telehealth (HOSPITAL_COMMUNITY): Payer: Self-pay

## 2020-09-17 NOTE — Telephone Encounter (Signed)
Paperwork was brought into office for Dr.Arida to complete, paperwork was put in Dr.Arida box 3/24

## 2020-09-17 NOTE — Telephone Encounter (Signed)
Pt insurance is active and benefits verified through [employer] Co-pay 0, DED $1,500/$1,500 met, out of pocket $4,000/$1,991.71 met, co-insurance 0%. no pre-authorization required. Passport, Yancy/[employer] 09/17/2020_0 :23pm, REF# [id number]

## 2020-09-18 ENCOUNTER — Encounter: Payer: Self-pay | Admitting: Physician Assistant

## 2020-09-18 ENCOUNTER — Telehealth: Payer: Self-pay | Admitting: *Deleted

## 2020-09-18 NOTE — Telephone Encounter (Signed)
Attempted to call pt with lab results and provider's recc.  No answer. Lmtcb.  

## 2020-09-18 NOTE — Telephone Encounter (Signed)
-----   Message from Lennon Alstrom, PA-C sent at 09/18/2020  1:33 PM EDT ----- Labs show --Elevated Bilirubin. Would recommend follow-up with PCP.  --Liver enzymes have returned to normal, which is good news --LDL is at goal (labs collected a bit early, but since at goal, this is fine) --Kidney function stable --Electrolytes at goal --Fluid lab was a bit elevated --Blood counts stable  Recommendations: Please increase to lasix 40mg  daily x3 days while holding his losartan. Then, drop down to his usual lasix 20mg  daily with restart of losartan at that time. Please let know if this does not improve your symptoms.

## 2020-09-18 NOTE — Telephone Encounter (Signed)
Patient's mother calling to speak with Konrad Felix in regards to disability forms she dropped off yesterday.

## 2020-09-21 ENCOUNTER — Telehealth: Payer: Self-pay | Admitting: Physician Assistant

## 2020-09-21 DIAGNOSIS — Z0279 Encounter for issue of other medical certificate: Secondary | ICD-10-CM

## 2020-09-21 NOTE — Telephone Encounter (Signed)
[employer] forms received, payment collected and placed in nurse box

## 2020-09-21 NOTE — Telephone Encounter (Signed)
Patients mother came into office to pick up paperwork and forms payment to drop off at [place] office, for Dr.Arida to complete.

## 2020-09-22 NOTE — Telephone Encounter (Signed)
-----   Message from Jacquelyn D Visser, PA-C sent at 09/18/2020  1:33 PM EDT ----- Labs show --Elevated Bilirubin. Would recommend follow-up with PCP.  --Liver enzymes have returned to normal, which is good news --LDL is at goal (labs collected a bit early, but since at goal, this is fine) --Kidney function stable --Electrolytes at goal --Fluid lab was a bit elevated --Blood counts stable  Recommendations: Please increase to lasix 40mg daily x3 days while holding his losartan. Then, drop down to his usual lasix 20mg daily with restart of losartan at that time. Please let us know if this does not improve your symptoms. 

## 2020-09-22 NOTE — Telephone Encounter (Signed)
Form completed to the best of my ability and given to Marisue Ivan, [place] nurse Lanny Hurst, RN. The patient is scheduled to see Jacquelyn tomorrow 09/23/20. The provider will need to address the return to work date, the restriction part of the form and sign.

## 2020-09-22 NOTE — Telephone Encounter (Signed)
Spoke to pt. Notified of results and provider's recc.  Pt verbalized understanding.  Unable to reach pt on 3/25.  Pt will incr Lasix to 40mg  daily (beginning today 3/29) x 3 days.  Pt has follow up appt scheduled tomorrow 3/30.  Will forward to Jacquelyn to make aware prior to appt tomorrow.

## 2020-09-23 ENCOUNTER — Other Ambulatory Visit: Payer: Self-pay

## 2020-09-23 ENCOUNTER — Ambulatory Visit: Payer: No Typology Code available for payment source | Admitting: Physician Assistant

## 2020-09-23 ENCOUNTER — Encounter: Payer: Self-pay | Admitting: Physician Assistant

## 2020-09-23 VITALS — BP 100/72 | HR 73 | Ht 67.0 in | Wt 178.0 lb

## 2020-09-23 DIAGNOSIS — I5020 Unspecified systolic (congestive) heart failure: Secondary | ICD-10-CM | POA: Diagnosis not present

## 2020-09-23 DIAGNOSIS — Z8674 Personal history of sudden cardiac arrest: Secondary | ICD-10-CM

## 2020-09-23 DIAGNOSIS — Z87891 Personal history of nicotine dependence: Secondary | ICD-10-CM

## 2020-09-23 DIAGNOSIS — I252 Old myocardial infarction: Secondary | ICD-10-CM

## 2020-09-23 DIAGNOSIS — E785 Hyperlipidemia, unspecified: Secondary | ICD-10-CM

## 2020-09-23 DIAGNOSIS — I2510 Atherosclerotic heart disease of native coronary artery without angina pectoris: Secondary | ICD-10-CM | POA: Diagnosis not present

## 2020-09-23 MED ORDER — FUROSEMIDE 20 MG PO TABS: 20.0000 mg | ORAL_TABLET | Freq: Every day | ORAL | 0 refills | Status: DC | PRN

## 2020-09-23 NOTE — Progress Notes (Addendum)
Office Visit    Patient Name: Adam CellarRonald Pothier Date of Encounter: 09/25/2020  PCP:  System, Provider Not In   [facility]  Cardiologist:  Lorine BearsMuhammad Arida, MD  Advanced Practice Provider: Marisue IvanJacquelyn Ruth Kovich, PA-C Electrophysiologist:  None :[phone]}:[id number]}   Chief Complaint    Chief Complaint  Patient presents with  . Follow-up    1 weekF/U-Patient c/o fatigue   45 year old male with history of VF arrest / cardiac arrest and anterior lateral ST elevation myocardial infarction s/p PCI/DES to occluded ostial LAD 09/07/2020 with clinical course been complicated by aspiration pneumonia, acute on chronic HFrEF/ICM with EF 25 to 35% by LV gram and 30-35% by echo, tobacco use, and seen today for 1 week follow-up.  Past Medical History    Past Medical History:  Diagnosis Date  . Coronary artery disease    occluded ostial LAD with otherwise no obstructive disease.   PCI/DES to ostial LAD  . HFrEF (heart failure with reduced ejection fraction) (HCC)    EF 30-35%  . History of cardiac arrest    VF arrest 3/14  . History of ST elevation myocardial infarction (STEMI)    09/07/20 anteriolateral STEMI  . History of tobacco use   . History of ventricular fibrillation    3/14  . Hyperlipidemia LDL goal <70    Past Surgical History:  Procedure Laterality Date  . CARDIAC CATHETERIZATION    . CORONARY/GRAFT ACUTE MI REVASCULARIZATION N/A 09/07/2020   Procedure: Coronary/Graft Acute MI Revascularization;  Surgeon: Iran OuchArida, Muhammad A, MD;  Location: [facility] INVASIVE CV LAB;  Service: Cardiovascular;  Laterality: N/A;  . RIGHT/LEFT HEART CATH AND CORONARY ANGIOGRAPHY N/A 09/07/2020   Procedure: RIGHT/LEFT HEART CATH AND CORONARY ANGIOGRAPHY;  Surgeon: Iran OuchArida, Muhammad A, MD;  Location: [facility] INVASIVE CV LAB;  Service: Cardiovascular;  Laterality: N/A;    Allergies  Allergies  Allergen Reactions  . Bacitracin-Neomycin-Polymyxin Hives  . Bupropion Nausea Only    Passed out     History of Present Illness    Adam CellarRonald Carter is a 45 y.o. male with PMH as above.  Family history includes uncle and father that may have had a heart attack. He had reportedly been dealing with cervical spine issues and had an injection done about a week ago.  He reported intermittent chest pain over the weekend but did not seek medical attention.  He went to work the day of his cardiac event and reported some mild chest discomfort initially, but then after lunch he reported severe chest pain followed by collapse.  Coworkers witnessed the collapse and did CPR.  When EMS arrived, he was in ventricular fibrillation.  He received 2 shocks and a single dose of epinephrine and had ROSC.  Post arrest EKG showed anterior ST elevation myocardial infarction.  STEMI code was activated from the field.  The patient was brought to the ED and his airway was secured with a ET tube.  He had an IV placed and was given heparin.  Emergent catheterization was performed.  Echo showed LVEF 30 to 35%, regional wall motion abnormalities of LV, mild LVH, moderate hypokinesis of the left ventricular, mid apical, anteroseptal wall, anterior wall, and anterior lateral wall.  LGS -5.3%.  Catheterization was performed that showed occluded ostial LAD with otherwise no obstructive disease.  Successful PCI/DES was performed to the ostial LAD.  High-sensitivity troponin peaked at 27,000.  He was extubated and off of nor epi as of 3/14.  He was started on low-dose losartan 12.5  mg daily.  He was fitted with LifeVest.  Brilinta card given to patient.  Given his bradycardia, beta-blocker was not added.  He was started on oral Lasix 20 mg daily.  Due to aspiration pneumonia/leukocytosis/fever, he was on antibiotics.  When seen in clinic 08/19/20, he reported chest soreness, occasional dizziness, orthopnea, cough with clear phlegm, and a right foot blister. He was not smoking (quit at discharge). 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  He was drinking 1 beer every 3 to 4 months. Given his low BP, losartan held while diuresis started for 3 days with lasix 20mg  daily. GDMT unable to be escalated due to BP.   Today, 09/23/20, he returns to clinic for volume reassessment and mainly notes fatigue. Unfortunately, he was not reached with the recommendation for 3 days of lasix until the day prior. However, on exam, he is relatively euvolemic. His girlfriend (with him today) reports he is not drinking very much fluid. He reports ongoing soreness of his chest but improving. He also reports positional CP that occurs when gets up in the AM, as well as CP elicited by coughing. He does have cervical nerve pain as well. No CP like before his admission. He reports improving cough with clear phlegm. He denies SOB, DOE, orthopnea, PND, and early satiety. No racing HR or palpitations. He does report dizziness with position changes. He also notes fatigue that started on Sunday. He just received his BP cuff and plans to start monitoring his pressure at home. He reports fatigue that started on Sunday and concerns him. He has not been active at home and has not yet started cardiac rehab. At his girlfriend's house, his home wt has been 173-178lbs. He reports scant blood in his tissue when blowing his nose or using the restroom but no other s/sx of bleeding. His foot blister is improving. He does not yet have a PCP with # provided today. He continues to avoid smoking. He is eating heart healthy with lots of vegetables. He has not yet had his visit with the HF clinic. He continues to wear his LifeVest.   Home Medications    Current Outpatient Medications on File Prior to Visit  Medication Sig Dispense Refill  . aspirin 81 MG chewable tablet Chew 1 tablet (81 mg total) by mouth daily. 30 tablet 2  . atorvastatin (LIPITOR) 80 MG tablet Take 1 tablet (80 mg total) by mouth daily at 6 PM. 30  tablet 3  . fluticasone (FLONASE) 50 MCG/ACT nasal spray Place 1 spray into both nostrils daily as needed.    . Multiple Vitamin (MULTIVITAMIN WITH MINERALS) TABS tablet Take 1 tablet by mouth daily. 30 tablet 0  . pantoprazole (PROTONIX) 20 MG tablet Take 1 tablet (20 mg total) by mouth daily. 30 tablet 5  . pregabalin (LYRICA) 75 MG capsule Take 75 mg by mouth 3 (three) times daily.    . ticagrelor (BRILINTA) 90 MG TABS tablet Take 1 tablet (90 mg total) by mouth 2 (two) times daily. 60 tablet 3   No current facility-administered medications on file prior to visit.    Review of Systems    He denies chest pain like before admission but does note chest soreness that is positional and also worse with coughing. No palpitations, dyspnea, pnd, n, v, syncope, edema, weight gain, or early satiety.  His coughing is improved. His orthopnea is now resolved. He is fatigued. No  s/sx of bleeding. Foot blister improving.   All other systems reviewed and are otherwise negative except as noted above.  Physical Exam    VS:  BP 100/72 (BP Location: Left Arm, Patient Position: Sitting, Cuff Size: Normal)   Pulse 73   Ht 5\' 7"  (1.702 m)   Wt 178 lb (80.7 kg)   SpO2 98%   BMI 27.88 kg/m  , BMI Body mass index is 27.88 kg/m. GEN: Well nourished, well developed, in no acute distress. Joined by his girlfriend. HEENT: normal. Neck: Supple, no JVD, carotid bruits, or masses.  Cardiac: Lifevest in place. RRR, no murmurs, rubs, or gallops. No clubbing, cyanosis. No edema.  Radials/DP/PT 2+ and equal bilaterally.   Respiratory:  Respirations regular and unlabored, CTAB. GI: Soft, nontender, nondistended, BS + x 4. MS: no deformity or atrophy. Skin:R foot blister with small hematoma but otherwise warm and dry, no rash. Neuro:  Strength and sensation are intact. Psych: Normal affect.  Accessory Clinical Findings    ECG personally reviewed by me today -NSR, 73 bpm, TWI  V2, V3, V4, 1, aVL as reviewed by DOD -  no acute changes.  VITALS Reviewed today   Temp Readings from Last 3 Encounters:  09/13/20 99.1 F (37.3 C) (Oral)   BP Readings from Last 3 Encounters:  09/23/20 100/72  09/16/20 92/62  09/13/20 106/72   Pulse Readings from Last 3 Encounters:  09/23/20 73  09/16/20 77  09/13/20 68    Wt Readings from Last 3 Encounters:  09/23/20 178 lb (80.7 kg)  09/16/20 182 lb (82.6 kg)  09/13/20 194 lb 7.1 oz (88.2 kg)     LABS  reviewed today   Lab Results  Component Value Date   WBC 12.1 (H) 09/16/2020   HGB 13.6 09/16/2020   HCT 40.7 09/16/2020   MCV 92.5 09/16/2020   PLT 393 09/16/2020   Lab Results  Component Value Date   CREATININE 0.98 09/16/2020   BUN 13 09/16/2020   NA 136 09/16/2020   K 4.3 09/16/2020   CL 104 09/16/2020   CO2 24 09/16/2020   Lab Results  Component Value Date   ALT 33 09/16/2020   AST 24 09/16/2020   ALKPHOS 87 09/16/2020   BILITOT 1.3 (H) 09/16/2020   Lab Results  Component Value Date   CHOL 117 09/16/2020   HDL 26 (L) 09/16/2020   LDLCALC 70 09/16/2020   TRIG 106 09/16/2020   CHOLHDL 4.5 09/16/2020    Lab Results  Component Value Date   HGBA1C 6.0 (H) 09/08/2020   No results found for: TSH   STUDIES/PROCEDURES reviewed today   Right and left heart catheterization 09/07/2020  The left ventricular ejection fraction is 25-35% by visual estimate.  There is moderate to severe left ventricular systolic dysfunction.  LV end diastolic pressure is moderately elevated.  Ost LAD to Prox LAD lesion is 100% stenosed.  Post intervention, there is a 0% residual stenosis.  A drug-eluting stent was successfully placed using a STENT RESOLUTE ONYX 3.5X12.  Mid LAD lesion is 30% stenosed. 1.  Out of hospital cardiac arrest with anterior ST elevation myocardial infarction due to ostial occlusion of the LAD.  No other obstructive disease. 2.  Moderately severely reduced LV systolic function with an EF of 25 to 30% with severe anterior/apical  hypokinesis. 3.  Successful angioplasty and drug-eluting stent placement to the ostial LAD. 4.  Right heart catheterization was performed after PCI and showed mildly elevated filling pressures, mild pulmonary  hypertension and normal cardiac output.  PA sat was 78.7%.  Fick cardiac output was 6.17 with a cardiac index of 3.5.  CPO was 1.4.  Based on these hemodynamic numbers, no hemodynamic support was utilized. Recommendations: Dual antiplatelet therapy for at least 1 year. High-dose atorvastatin. We will run Aggrastat infusion for 12 hours given uncertainty of oral absorption of antiplatelet medications. The patient had significant agitation and this will need to be managed.  He is currently on propofol drip and fentanyl drip.  I discussed with Dr. Belia Heman. Recommend electrolyte replacement.  Critical care time of 45 minutes managing abnormal hemodynamics, ventilator management and electrolytes. There is a 0% residual stenosis post intervention.    Wall Motion  Resting                Left Heart  Left Ventricle The left ventricular size is normal. There is moderate to severe left ventricular systolic dysfunction. LV end diastolic pressure is moderately elevated. The left ventricular ejection fraction is 25-35% by visual estimate. There are LV function abnormalities due to segmental dysfunction.   Coronary Diagrams   Diagnostic Dominance: Right    Intervention       Echocardiogram 09/08/2020 1. Left ventricular ejection fraction, by estimation, is 30 to 35%. The  left ventricle has moderately decreased function. The left ventricle  demonstrates regional wall motion abnormalities (see scoring  diagram/findings for description). There is mild  left ventricular hypertrophy. Left ventricular diastolic parameters were  normal. There is moderate hypokinesis of the left ventricular, mid-apical  anteroseptal wall, anterior wall and anterolateral wall. The average left   ventricular global longitudinal  strain is -5.3 %. The global longitudinal strain is abnormal.  2. Right ventricular systolic function is normal. The right ventricular  size is normal. Tricuspid regurgitation signal is inadequate for assessing  PA pressure.  3. The mitral valve is normal in structure. No evidence of mitral valve  regurgitation. No evidence of mitral stenosis.  4. The aortic valve is normal in structure. Aortic valve regurgitation is  not visualized. No aortic stenosis is present.   Assessment & Plan    Coronary artery disease History of anterior lateral ST elevation myocardial infarction History of VF arrest/cardiac arrest --No chest pain or symptoms similar to that before his 3/14 presentation.  He does report chest soreness and positional pain, as well as fatigue. He underwent emergent LHC PCI/DES to the ostial LAD.  LV gram demonstrated EF 25 to 35% with follow-up echo showing EF 30 to 35%.  BP soft today, preventing escalation of GDMT.   Continue DAPT with ASA and Brilinta. Continue Protonix. No beta-blocker and continue to hold losartan given orthostatic hypotension sx and soft BP/hypotension. Escalate GDMT at RTC if able.  Acute on chronic HFrEF (EF 30-35%) History of cardiogenic shock --Euvolemic today. Physical exam improved from 1 week ago and no further reported orthopnea. BP soft today, though improved from previous visit. Will continue to defer addition of BB and restart of Losartan. Given euvolemia and low oral intake, will drop to PRN dosing of lasix  daily and discussed daily wts with the pt in detail.  He will monitor his wt and BP at home. Escalation of GDMT at RTC as BP and heart rate allow. Recommend follow-up echo once optimized GDMT or in 3 to 6 months. Continue LifeVest until that time. CHF education reviewed again today.   Hyperlipidemia, LDL goal less than 70 --Continue atorvastatin 80 mg daily.  LDL 75 during admission.  Recommend  repeat  lipid and liver function in 6 to 8 weeks from start of atorvastatin.  History of tobacco use --Ongoing cessation recommended.   Foot blister --Per PCP/podiatry. [facility] number provided today.  Elevated A1C --3/15 A1c 6.0.  Reviewed diet and lifestyle. Recommend follow-up with PCP. Consider Farxiga at RTC.  Disability paperwork --Will complete and fax copy to employer. Will send original back to pt.  Medication changes: Hold losartan for soft BP -PRN lasix 20mg  daily Labs ordered:  lipid and liver in 6-8 weeks Studies / Imaging ordered: None. Future considerations: echo in ~3-6 months. Escalation of GDMT as vitals allow.  Addendum: Echo moved to end of April 2022 after discussing case as symptoms with primary cardiologist. Would avoid MPI given LAD vessel of question. Disposition: RTC 1 mo for reassessment or sooner if needed due to symptoms not improving and as discussed on the phone with the patient on 09/25/20.   11/25/20, PA-C 09/25/2020

## 2020-09-23 NOTE — Patient Instructions (Addendum)
Medication Instructions:  Your physician has recommended you make the following change in your medication:   CHANGE Lasix (Furosemide) to 20mg  daily AS NEEDED - see instructions below regarding Lasix  Continue to HOLD Losartan  *If you need a refill on your cardiac medications before your next appointment, please call your pharmacy*    Lab Work: None ordered   Testing/Procedures: None ordered   Follow-Up: At [facility], you and your health needs are our priority.  As part of our continuing mission to provide you with exceptional heart care, we have created designated Provider Care Teams.  These Care Teams include your primary Cardiologist (physician) and Advanced Practice Providers (APPs -  Physician Assistants and Nurse Practitioners) who all work together to provide you with the care you need, when you need it.  We recommend signing up for the patient portal called "MyChart".  Sign up information is provided on this After Visit Summary.  MyChart is used to connect with patients for Virtual Visits (Telemedicine).  Patients are able to view lab/test results, encounter notes, upcoming appointments, etc.  Non-urgent messages can be sent to your provider as well.   To learn more about what you can do with MyChart, go to [facility].    Your next appointment:   1 month(s)  The format for your next appointment:   In Person  Provider:   You may see [email or web address], MD or one of the following Advanced Practice Providers on your designated Care Team:    Lorine Bears, NP  Nicolasa Ducking, PA-C  Eula Listen, PA-C  Cadence Marisue Ivan, Fransico Michael  [place], NP    Other Instructions  As needed Lasix:  Your dry weight will be what your scale says on the day you return home.  If you gain more than 3 pounds overnight from dry weight, take one additional lasix pill daily until weight returns to baseline dry weight and breathing improves when laying flat, swelling  improves in legs or belly, or breathing at rest improves.  If weight gain is greater than 5 pounds in 1 week and associated with continued shortness of breath or symptoms as above, despite taking the lasix, contact the office for further assistance.   Please follow these special instructions:  1. Follow a low-salt diet - you are allowed no more than 2,000mg  of sodium per day. Watch your fluid intake. In general, you should not be taking in more than 2 liters of fluid per day (no more than 8 glasses per day). This includes sources of water in foods like soup, coffee, tea, milk, etc. 2. Weigh yourself on the same scale at same time of day and keep a log. 3. Call your doctor: (Anytime you feel any of the following symptoms)  - 3lb weight gain overnight or 5lb within a few days / week - Shortness of breath, with or without a dry hacking cough  - Swelling in the hands, feet or stomach  - If you have to sleep on extra pillows at night in order to breathe   Blood pressure: Take BP in the morning before eating. Each time you measure, take an additional reading if abnormal to ensure accurate.  Do not measure BP right after you wake up. Take BP before exercising. Avoid caffeine, tobacco, and alcohol for 30 minutes before taking a measurement. Sit quietly for five minutes in a comfortable position with your legs and ankles uncrossed and back supported. Have your arm supported and at the level of your heart.  Always use the same arm when taking your blood pressure. Place the cuff over bare skin rather than clothing. If needed, take a repeat BP reading by waiting 1-3 minutes after the first reading. --Blood pressure varies often throughout the day with higher readings in the morning. BP may also be lower at home than in the office.  --It is helpful to document the time of each BP reading, as well as any activity or medications taken around the reading. In addition, it is helpful to include HR. Please bring your BP  log into the office.  --Goal BP is 130/80 or lower.  If your blood pressure is consistently elevated with top number above 180 and bottom above 120, this can damage the body. If you have severe increase in your blood pressure or concerning symptoms of severe chest pain, headache with confusion and blurred vision, severe abdominal or back pain, SOB, seizures, or LOC, go to the ED.  --If your BP is less than 100 for the top number and you feel dizzy, call the office.   Extra notes: Call if you haven't heard from cardiac rehabilitation or the heart failure clinic in the next couple of weeks. Continue to wear your LifeVest.   Cardiac Rehab [phone]

## 2020-09-25 ENCOUNTER — Telehealth: Payer: Self-pay | Admitting: *Deleted

## 2020-09-25 NOTE — Telephone Encounter (Signed)
Faxed pt's completed short term disability paperwork to "[employer]" @ [phone].  Spoke to pt, and he requested that we mail original paperwork to his home address.  Address confirmed with pt. Placed in mail. Copy made and will have scanned in pt's chart.   Also calling to have pt's echo appt moved from 03/15/21 to the end of April 2022 per Jacquelyn.  Pt's echo has been r/s to 10/23/20.  Pt has no further questions at this time.

## 2020-09-25 NOTE — Telephone Encounter (Signed)
Patient is returning your call.  

## 2020-10-01 LAB — BLOOD GAS, ARTERIAL
Acid-base deficit: 2.8 mmol/L — ABNORMAL HIGH (ref 0.0–2.0)
Acid-base deficit: 3.2 mmol/L — ABNORMAL HIGH (ref 0.0–2.0)
Bicarbonate: 24.1 mmol/L (ref 20.0–28.0)
Bicarbonate: 22.7 mmol/L (ref 20.0–28.0)
FIO2: 0.8
FIO2: 0.8
O2 Saturation: 98.2 %
O2 Saturation: 98.7 %
PEEP: 5 cmH2O
PEEP: 5 cmH2O
Patient temperature: 37
Patient temperature: 37
RATE: 18 resp/min
RATE: 18 resp/min
pCO2 arterial: 49 mmHg — ABNORMAL HIGH (ref 32.0–48.0)
pCO2 arterial: 43 mmHg (ref 32.0–48.0)
pH, Arterial: 7.3 — ABNORMAL LOW (ref 7.350–7.450)
pH, Arterial: 7.33 — ABNORMAL LOW (ref 7.350–7.450)
pO2, Arterial: 119 mmHg — ABNORMAL HIGH (ref 83.0–108.0)
pO2, Arterial: 129 mmHg — ABNORMAL HIGH (ref 83.0–108.0)

## 2020-10-01 LAB — COOXEMETRY PANEL
Carboxyhemoglobin: 1 % (ref 0.5–1.5)
Methemoglobin: 0.7 % (ref 0.0–1.5)

## 2020-10-02 ENCOUNTER — Other Ambulatory Visit: Payer: Self-pay

## 2020-10-02 ENCOUNTER — Encounter: Payer: No Typology Code available for payment source | Attending: Cardiovascular Disease | Admitting: *Deleted

## 2020-10-02 DIAGNOSIS — I213 ST elevation (STEMI) myocardial infarction of unspecified site: Secondary | ICD-10-CM | POA: Insufficient documentation

## 2020-10-02 DIAGNOSIS — Z955 Presence of coronary angioplasty implant and graft: Secondary | ICD-10-CM

## 2020-10-02 NOTE — Progress Notes (Signed)
Initial telephone orientation completed. Diagnosis can be found in [facility] 3/14. EP orientation scheduled for 4/14 at 8am.

## 2020-10-02 NOTE — Telephone Encounter (Signed)
Forms received in front office and scanned into documents

## 2020-10-03 NOTE — Progress Notes (Signed)
Patient ID: Adam Carter, male    DOB: 10-11-1975, 45 y.o.   MRN: [id number]  HPI  Adam Carter is a 45 y/o male with a history of CAD, hyperlipidemia, VF with arrest, previous tobacco use and chronic heart failure.   Echo report from 09/08/20 reviewed and showed an EF of 30-35% along with mild LVH.   RHC/ LHC done 09/07/20 showed:  The left ventricular ejection fraction is 25-35% by visual estimate.  There is moderate to severe left ventricular systolic dysfunction.  LV end diastolic pressure is moderately elevated.  Ost LAD to Prox LAD lesion is 100% stenosed.  Post intervention, there is a 0% residual stenosis.  A drug-eluting stent was successfully placed using a STENT RESOLUTE ONYX 3.5X12.  Mid LAD lesion is 30% stenosed.   1.  Out of hospital cardiac arrest with anterior ST elevation myocardial infarction due to ostial occlusion of the LAD.  No other obstructive disease. 2.  Moderately severely reduced LV systolic function with an EF of 25 to 30% with severe anterior/apical hypokinesis. 3.  Successful angioplasty and drug-eluting stent placement to the ostial LAD. 4.  Right heart catheterization was performed after PCI and showed mildly elevated filling pressures, mild pulmonary hypertension and normal cardiac output.  PA sat was 78.7%.  Fick cardiac output was 6.17 with a cardiac index of 3.5.  CPO was 1.4.  Based on these hemodynamic numbers, no hemodynamic support was utilized.  Admitted 09/07/20 due to out of hospital cardiac arrest due to anterior STEMI with subsequent VF. Cardiology and nephrology consults obtained. Cath done with stent placed. Successfully extubated. Lifevest placed. Given IV antibiotics due to pneumonia. Weaned off oxygen down to room air. Initially needed pressors due to septic shock but BP stabilized and weaned off pressors. Does of IV lasix given. Discharged after 6 days.   He presents today for his initial visit with a chief complaint of minimal fatigue upon  moderate exertion. He describes this as having been present for a few months but feels like it's improving over the last several weeks. He has associated dry cough, minimal shortness of breath, occasional light-headedness, intermittent back pain and easy bruising along with this. He denies any difficulty sleeping, abdominal distention, palpitations, pedal edema, chest pain or weight gain.   Does take his furosemide PRN but has taken it daily for the last few days because his weight went up although he admits that he's not sure he was weighing some days as consistently. No shocking from his lifevest.   Is getting ready to start cardiac rehab later this week and is looking forward to that as it will give him something to do.  Past Medical History:  Diagnosis Date  . CHF (congestive heart failure) (HCC)   . Coronary artery disease    occluded ostial LAD with otherwise no obstructive disease.   PCI/DES to ostial LAD  . HFrEF (heart failure with reduced ejection fraction) (HCC)    EF 30-35%  . History of cardiac arrest    VF arrest 3/14  . History of ST elevation myocardial infarction (STEMI)    09/07/20 anteriolateral STEMI  . History of tobacco use   . History of ventricular fibrillation    3/14  . Hyperlipidemia LDL goal <70    Past Surgical History:  Procedure Laterality Date  . CARDIAC CATHETERIZATION    . CORONARY/GRAFT ACUTE MI REVASCULARIZATION N/A 09/07/2020   Procedure: Coronary/Graft Acute MI Revascularization;  Surgeon: Iran Ouch, MD;  Location: [facility] INVASIVE  CV LAB;  Service: Cardiovascular;  Laterality: N/A;  . RIGHT/LEFT HEART CATH AND CORONARY ANGIOGRAPHY N/A 09/07/2020   Procedure: RIGHT/LEFT HEART CATH AND CORONARY ANGIOGRAPHY;  Surgeon: Iran Ouch, MD;  Location: [facility] INVASIVE CV LAB;  Service: Cardiovascular;  Laterality: N/A;   Family History  Problem Relation Age of Onset  . Diabetes Father    Social History   Tobacco Use  . Smoking status: Former  Smoker    Quit date: 09/09/2020    Years since quitting: 0.0  . Smokeless tobacco: Never Used  Substance Use Topics  . Alcohol use: Not Currently    Comment: rare   Allergies  Allergen Reactions  . Bacitracin-Neomycin-Polymyxin Hives  . Bupropion Nausea Only    Passed out   Prior to Admission medications   Medication Sig Start Date End Date Taking? Authorizing Provider  aspirin 81 MG chewable tablet Chew 1 tablet (81 mg total) by mouth daily. 09/14/20  Yes Enedina Finner, MD  atorvastatin (LIPITOR) 80 MG tablet Take 1 tablet (80 mg total) by mouth daily at 6 PM. 09/13/20  Yes Enedina Finner, MD  fluticasone [facility]) 50 MCG/ACT nasal spray Place 1 spray into both nostrils daily as needed. 08/13/19  Yes [provider]  furosemide (LASIX) 20 MG tablet Take 1 tablet (20 mg total) by mouth daily as needed. 09/23/20  Yes Marisue Ivan D, PA-C  Multiple Vitamin (MULTIVITAMIN WITH MINERALS) TABS tablet Take 1 tablet by mouth daily. 09/14/20  Yes Enedina Finner, MD  pantoprazole (PROTONIX) 20 MG tablet Take 1 tablet (20 mg total) by mouth daily. 09/16/20 03/15/21 Yes Visser, Jacquelyn D, PA-C  pregabalin (LYRICA) 75 MG capsule Take 75 mg by mouth 3 (three) times daily.   Yes [provider]  ticagrelor (BRILINTA) 90 MG TABS tablet Take 1 tablet (90 mg total) by mouth 2 (two) times daily. 09/13/20  Yes Enedina Finner, MD    Review of Systems  Constitutional: Positive for fatigue. Negative for appetite change.  HENT: Negative for congestion, postnasal drip and sore throat.   Eyes: Negative.   Respiratory: Positive for cough (dry) and shortness of breath (very little).   Cardiovascular: Negative for chest pain, palpitations and leg swelling.  Gastrointestinal: Negative for abdominal distention and abdominal pain.  Endocrine: Negative.   Genitourinary: Negative.   Musculoskeletal: Positive for arthralgias (anterior rib pain) and back pain (at times).  Skin: Negative.    Allergic/Immunologic: Negative.   Neurological: Positive for light-headedness (sometimes). Negative for dizziness.  Hematological: Negative for adenopathy. Bruises/bleeds easily.  Psychiatric/Behavioral: Negative for dysphoric mood and sleep disturbance (sleeping on 3 pillows). The patient is not nervous/anxious.    Vitals:   10/05/20 0811  BP: 107/82  Pulse: 74  Resp: 18  SpO2: 99%  Weight: 180 lb 6 oz (81.8 kg)  Height: 5\' 6"  (1.676 m)   Wt Readings from Last 3 Encounters:  10/05/20 180 lb 6 oz (81.8 kg)  09/23/20 178 lb (80.7 kg)  09/16/20 182 lb (82.6 kg)   Lab Results  Component Value Date   CREATININE 0.98 09/16/2020   CREATININE 0.78 09/13/2020   CREATININE 0.92 09/12/2020    Physical Exam Vitals and nursing note reviewed. Exam conducted with a chaperone present (mom).  Constitutional:      Appearance: Normal appearance.  HENT:     Head: Normocephalic and atraumatic.  Cardiovascular:     Rate and Rhythm: Normal rate and regular rhythm.  Pulmonary:     Effort: Pulmonary effort is normal. No respiratory  distress.     Breath sounds: No wheezing or rales.  Abdominal:     General: There is no distension.     Palpations: Abdomen is soft.  Musculoskeletal:        General: No tenderness.     Cervical back: Normal range of motion and neck supple.     Right lower leg: No edema.     Left lower leg: No edema.  Skin:    General: Skin is warm and dry.     Findings: Bruising (right lower arm) present.  Neurological:     General: No focal deficit present.     Mental Status: He is alert and oriented to person, place, and time.  Psychiatric:        Mood and Affect: Mood normal.        Behavior: Behavior normal.        Thought Content: Thought content normal.    Assessment & Plan:  1: Chronic heart failure with reduced ejection fraction- - NYHA class II - euvolemic today - weighing daily and understands to call for an overnight weight gain of > 2 pounds or a weekly  weight gain of > 5 pounds - not adding salt and has been diligent about reading food labels for sodium content and is trying to keep his daily consumption to around 2000mg / day - drinking ~ 2L of fluid / day - taking furosemide PRN based on weight/ symptoms; says that he has taken it daily for the last 3 days due to weight change although admits he might not have been consistent in weighing with similar clothing; no signs of any fluid retention today so advised to continue taking it PRN - if he finds that he needs to continue taking it daily, he's to call back and let know - current BP does not allow for GDMT although it has improved over the last few weeks; hopefully it will rise a little higher for med adjustment - BNP 09/16/20 was 119.7 - no tobacco use  2: CAD- - recent cardiac arrest with subsequent stent - saw cardiology 09/18/20) 09/23/20; returns 10/26/20 after echo on 10/23/20 - wearing lifevest without any shocking; has had a few alarms - begins cardiac rehab later this week - BMP 09/16/20 reviewed and showed sodium 136, potassium 4.3, creatinine 0.98 and GFR >60   Medication bottles reviewed.   Return in 2 months or sooner for any questions/problems before then.

## 2020-10-05 ENCOUNTER — Ambulatory Visit: Payer: No Typology Code available for payment source | Attending: Family | Admitting: Family

## 2020-10-05 ENCOUNTER — Other Ambulatory Visit: Payer: Self-pay

## 2020-10-05 ENCOUNTER — Encounter: Payer: Self-pay | Admitting: Family

## 2020-10-05 VITALS — BP 107/82 | HR 74 | Resp 18 | Ht 66.0 in | Wt 180.4 lb

## 2020-10-05 DIAGNOSIS — I252 Old myocardial infarction: Secondary | ICD-10-CM | POA: Insufficient documentation

## 2020-10-05 DIAGNOSIS — Z8674 Personal history of sudden cardiac arrest: Secondary | ICD-10-CM | POA: Insufficient documentation

## 2020-10-05 DIAGNOSIS — I5022 Chronic systolic (congestive) heart failure: Secondary | ICD-10-CM | POA: Insufficient documentation

## 2020-10-05 DIAGNOSIS — R059 Cough, unspecified: Secondary | ICD-10-CM | POA: Insufficient documentation

## 2020-10-05 DIAGNOSIS — Z79899 Other long term (current) drug therapy: Secondary | ICD-10-CM | POA: Diagnosis not present

## 2020-10-05 DIAGNOSIS — R0602 Shortness of breath: Secondary | ICD-10-CM | POA: Diagnosis not present

## 2020-10-05 DIAGNOSIS — Z7982 Long term (current) use of aspirin: Secondary | ICD-10-CM | POA: Diagnosis not present

## 2020-10-05 DIAGNOSIS — E785 Hyperlipidemia, unspecified: Secondary | ICD-10-CM | POA: Diagnosis not present

## 2020-10-05 DIAGNOSIS — R42 Dizziness and giddiness: Secondary | ICD-10-CM | POA: Insufficient documentation

## 2020-10-05 DIAGNOSIS — I2510 Atherosclerotic heart disease of native coronary artery without angina pectoris: Secondary | ICD-10-CM

## 2020-10-05 DIAGNOSIS — Z87891 Personal history of nicotine dependence: Secondary | ICD-10-CM | POA: Insufficient documentation

## 2020-10-05 DIAGNOSIS — I5020 Unspecified systolic (congestive) heart failure: Secondary | ICD-10-CM

## 2020-10-05 DIAGNOSIS — M549 Dorsalgia, unspecified: Secondary | ICD-10-CM | POA: Insufficient documentation

## 2020-10-05 DIAGNOSIS — Z955 Presence of coronary angioplasty implant and graft: Secondary | ICD-10-CM | POA: Insufficient documentation

## 2020-10-05 NOTE — Patient Instructions (Signed)
Continue weighing daily and call for an overnight weight gain of > 2 pounds or a weekly weight gain of >5 pounds. 

## 2020-10-08 ENCOUNTER — Other Ambulatory Visit: Payer: Self-pay

## 2020-10-08 VITALS — Ht 67.75 in | Wt 178.0 lb

## 2020-10-08 DIAGNOSIS — Z955 Presence of coronary angioplasty implant and graft: Secondary | ICD-10-CM

## 2020-10-08 DIAGNOSIS — I213 ST elevation (STEMI) myocardial infarction of unspecified site: Secondary | ICD-10-CM

## 2020-10-08 NOTE — Progress Notes (Signed)
Cardiac Individual Treatment Plan  Patient Details  Name: Adam Carter MRN: [id number] Date of Birth: 1975-08-09 Referring Provider:   Flowsheet Row [facility]  Referring Provider Lorine BearsArida, Muhammad MD      Initial Encounter Date:  Flowsheet Row [facility]  Date 10/08/20      Visit Diagnosis: ST elevation myocardial infarction (STEMI), unspecified artery [facility](HCC)  Status post coronary artery stent placement  Patient's Home Medications on Admission:  Current Outpatient Medications:  .  aspirin 81 MG chewable tablet, Chew 1 tablet (81 mg total) by mouth daily., Disp: 30 tablet, Rfl: 2 .  atorvastatin (LIPITOR) 80 MG tablet, Take 1 tablet (80 mg total) by mouth daily at 6 PM., Disp: 30 tablet, Rfl: 3 .  fluticasone (FLONASE) 50 MCG/ACT nasal spray, Place 1 spray into both nostrils daily as needed., Disp: , Rfl:  .  furosemide (LASIX) 20 MG tablet, Take 1 tablet (20 mg total) by mouth daily as needed., Disp: 30 tablet, Rfl: 0 .  Multiple Vitamin (MULTIVITAMIN WITH MINERALS) TABS tablet, Take 1 tablet by mouth daily., Disp: 30 tablet, Rfl: 0 .  pantoprazole (PROTONIX) 20 MG tablet, Take 1 tablet (20 mg total) by mouth daily., Disp: 30 tablet, Rfl: 5 .  pregabalin (LYRICA) 75 MG capsule, Take 75 mg by mouth 3 (three) times daily., Disp: , Rfl:  .  ticagrelor (BRILINTA) 90 MG TABS tablet, Take 1 tablet (90 mg total) by mouth 2 (two) times daily., Disp: 60 tablet, Rfl: 3  Past Medical History: Past Medical History:  Diagnosis Date  . CHF (congestive heart failure) (HCC)   . Coronary artery disease    occluded ostial LAD with otherwise no obstructive disease.   PCI/DES to ostial LAD  . HFrEF (heart failure with reduced ejection fraction) (HCC)    EF 30-35%  . History of cardiac arrest    VF arrest 3/14  . History of ST elevation myocardial infarction (STEMI)    09/07/20 anteriolateral  STEMI  . History of tobacco use   . History of ventricular fibrillation    3/14  . Hyperlipidemia LDL goal <70     Tobacco Use: Social History   Tobacco Use  Smoking Status Former Smoker  . Quit date: 09/09/2020  . Years since quitting: 0.0  Smokeless Tobacco Never Used    Labs: Recent Review Flowsheet Data    Labs for ITP Cardiac and Pulmonary Rehab Latest Ref Rng & Units 09/07/2020 09/08/2020 09/09/2020 09/11/2020 09/16/2020   Cholestrol 0 - 200 mg/dL - - 478126 - 295117   LDLCALC 0 - 99 mg/dL - - 75 - 70   HDL >62>40 mg/dL - - 13(Y34(L) - 86(V26(L)   Trlycerides <150 mg/dL - [id number] 86 696(E176(H) 952106   Hemoglobin A1c 4.8 - 5.6 % - 6.0(H) - - -   PHART 7.350 - 7.450 7.30(L) 7.33(L) - - -   PCO2ART 32.0 - 48.0 mmHg 49(H) 43 - - -   HCO3 20.0 - 28.0 mmol/L 24.1 22.7 - - -   ACIDBASEDEF 0.0 - 2.0 mmol/L 2.8(H) 3.2(H) - - -   O2SAT % 98.2 98.7 - 67.7 -       Exercise Target Goals: Exercise Program Goal: Individual exercise prescription set using results from initial 6 min walk test and THRR while considering  patient's activity barriers and safety.   Exercise Prescription Goal: Initial exercise prescription builds to 30-45 minutes a day of  aerobic activity, 2-3 days per week.  Home exercise guidelines will be given to patient during program as part of exercise prescription that the participant will acknowledge.   Education: Aerobic Exercise: - Group verbal and visual presentation on the components of exercise prescription. Introduces F.I.T.T principle from ACSM for exercise prescriptions.  Reviews F.I.T.T. principles of aerobic exercise including progression. Written material given at graduation.   Education: Resistance Exercise: - Group verbal and visual presentation on the components of exercise prescription. Introduces F.I.T.T principle from ACSM for exercise prescriptions  Reviews F.I.T.T. principles of resistance exercise including progression. Written material given at graduation.     Education: Exercise & Equipment Safety: - Individual verbal instruction and demonstration of equipment use and safety with use of the equipment. Flowsheet Row Cardiac Rehab from 10/08/2020 in [facility] Cardiac and Pulmonary Rehab  Education need identified 10/08/20  Date 10/08/20  Educator KL  Instruction Review Code 1- Verbalizes Understanding      Education: Exercise Physiology & General Exercise Guidelines: - Group verbal and written instruction with models to review the exercise physiology of the cardiovascular system and associated critical values. Provides general exercise guidelines with specific guidelines to those with heart or lung disease.    Education: Flexibility, Balance, Mind/Body Relaxation: - Group verbal and visual presentation with interactive activity on the components of exercise prescription. Introduces F.I.T.T principle from ACSM for exercise prescriptions. Reviews F.I.T.T. principles of flexibility and balance exercise training including progression. Also discusses the mind body connection.  Reviews various relaxation techniques to help reduce and manage stress (i.e. Deep breathing, progressive muscle relaxation, and visualization). Balance handout provided to take home. Written material given at graduation.   Activity Barriers & Risk Stratification:  Activity Barriers & Cardiac Risk Stratification - 10/08/20 0932      Activity Barriers & Cardiac Risk Stratification   Activity Barriers Neck/Spine Problems;Back Problems;Other (comment)    Comments Rib pain from chest compressions    Cardiac Risk Stratification High           6 Minute Walk:   Oxygen Initial Assessment:   Oxygen Re-Evaluation:   Oxygen Discharge (Final Oxygen Re-Evaluation):   Initial Exercise Prescription:  Initial Exercise Prescription - 10/08/20 1100      Date of Initial Exercise RX and Referring Provider   Date 10/08/20    Referring Provider Lorine Bears MD      Treadmill    MPH 2.8    Grade 2    Minutes 15    METs 3.9      NuStep   Level 4    SPM 80    Minutes 15    METs 4.5      Recumbant Elliptical   Level 3    RPM 50    Minutes 15    METs 4.5      REL-XR   Level 4    Speed 50    Minutes 15    METs 4.5      T5 Nustep   Level 3    SPM 80    Minutes 15    METs 4.5      Prescription Details   Frequency (times per week) 3    Duration Progress to 30 minutes of continuous aerobic without signs/symptoms of physical distress      Intensity   THRR 40-80% of Max Heartrate 111-155    Ratings of Perceived Exertion 11-13    Perceived Dyspnea 0-4      Progression   Progression Continue to  progress workloads to maintain intensity without signs/symptoms of physical distress.      Resistance Training   Training Prescription Yes    Weight 5 lb    Reps 10-15           Perform Capillary Blood Glucose checks as needed.  Exercise Prescription Changes:  Exercise Prescription Changes    Row Name 10/08/20 1100             Response to Exercise   Blood Pressure (Admit) 106/68       Blood Pressure (Exercise) 116/68       Blood Pressure (Exit) 108/70       Heart Rate (Admit) 68 bpm       Heart Rate (Exercise) 97 bpm       Heart Rate (Exit) 66 bpm       Oxygen Saturation (Admit) 96 %       Oxygen Saturation (Exercise) 97 %       Oxygen Saturation (Exit) 97 %       Rating of Perceived Exertion (Exercise) 7       Perceived Dyspnea (Exercise) 1       Symptoms none       Comments walk test results              Exercise Comments:   Exercise Goals and Review:  Exercise Goals    Row Name 10/08/20 1147             Exercise Goals   Increase Physical Activity Yes       Intervention Provide advice, education, support and counseling about physical activity/exercise needs.;Develop an individualized exercise prescription for aerobic and resistive training based on initial evaluation findings, risk stratification, comorbidities and  participant's personal goals.       Expected Outcomes Short Term: Attend rehab on a regular basis to increase amount of physical activity.;Long Term: Add in home exercise to make exercise part of routine and to increase amount of physical activity.;Long Term: Exercising regularly at least 3-5 days a week.       Increase Strength and Stamina Yes       Intervention Provide advice, education, support and counseling about physical activity/exercise needs.;Develop an individualized exercise prescription for aerobic and resistive training based on initial evaluation findings, risk stratification, comorbidities and participant's personal goals.       Expected Outcomes Short Term: Increase workloads from initial exercise prescription for resistance, speed, and METs.;Short Term: Perform resistance training exercises routinely during rehab and add in resistance training at home;Long Term: Improve cardiorespiratory fitness, muscular endurance and strength as measured by increased METs and functional capacity ( )       Able to understand and use rate of perceived exertion (RPE) scale Yes       Intervention Provide education and explanation on how to use RPE scale       Expected Outcomes Short Term: Able to use RPE daily in rehab to express subjective intensity level;Long Term:  Able to use RPE to guide intensity level when exercising independently       Able to understand and use Dyspnea scale Yes       Intervention Provide education and explanation on how to use Dyspnea scale       Expected Outcomes Short Term: Able to use Dyspnea scale daily in rehab to express subjective sense of shortness of breath during exertion;Long Term: Able to use Dyspnea scale to guide intensity level when exercising independently  Knowledge and understanding of Target Heart Rate Range (THRR) Yes       Intervention Provide education and explanation of THRR including how the numbers were predicted and where they are located for  reference       Expected Outcomes Short Term: Able to state/look up THRR;Short Term: Able to use daily as guideline for intensity in rehab;Long Term: Able to use THRR to govern intensity when exercising independently       Able to check pulse independently Yes       Intervention Provide education and demonstration on how to check pulse in carotid and radial arteries.;Review the importance of being able to check your own pulse for safety during independent exercise       Expected Outcomes Short Term: Able to explain why pulse checking is important during independent exercise;Long Term: Able to check pulse independently and accurately       Understanding of Exercise Prescription Yes       Intervention Provide education, explanation, and written materials on patient's individual exercise prescription       Expected Outcomes Short Term: Able to explain program exercise prescription;Long Term: Able to explain home exercise prescription to exercise independently              Exercise Goals Re-Evaluation :   Discharge Exercise Prescription (Final Exercise Prescription Changes):  Exercise Prescription Changes - 10/08/20 1100      Response to Exercise   Blood Pressure (Admit) 106/68    Blood Pressure (Exercise) 116/68    Blood Pressure (Exit) 108/70    Heart Rate (Admit) 68 bpm    Heart Rate (Exercise) 97 bpm    Heart Rate (Exit) 66 bpm    Oxygen Saturation (Admit) 96 %    Oxygen Saturation (Exercise) 97 %    Oxygen Saturation (Exit) 97 %    Rating of Perceived Exertion (Exercise) 7    Perceived Dyspnea (Exercise) 1    Symptoms none    Comments walk test results           Nutrition:  Target Goals: Understanding of nutrition guidelines, daily intake of sodium 1500mg , cholesterol 200mg , calories 30% from fat and 7% or less from saturated fats, daily to have 5 or more servings of fruits and vegetables.  Education: All About Nutrition: -Group instruction provided by verbal, written  material, interactive activities, discussions, models, and posters to present general guidelines for heart healthy nutrition including fat, fiber, MyPlate, the role of sodium in heart healthy nutrition, utilization of the nutrition label, and utilization of this knowledge for meal planning. Follow up email sent as well. Written material given at graduation.   Biometrics:  Pre Biometrics - 10/08/20 0932      Pre Biometrics   Height 5' 7.75" (1.721 m)    Weight 178 lb (80.7 kg)    BMI (Calculated) 27.26    Single Leg Stand 30 seconds            Nutrition Therapy Plan and Nutrition Goals:   Nutrition Assessments:  MEDIFICTS Score Key:  ?70 Need to make dietary changes   40-70 Heart Healthy Diet  ? 40 Therapeutic Level Cholesterol Diet  Flowsheet Row Cardiac Rehab from 10/08/2020 in [facility] Cardiac and Pulmonary Rehab  Picture Your Plate Total Score on Admission 78     Picture Your Plate Scores:  <40 Unhealthy dietary pattern with much room for improvement.  41-50 Dietary pattern unlikely to meet recommendations for good health and room for improvement.  51-60  More healthful dietary pattern, with some room for improvement.   >60 Healthy dietary pattern, although there may be some specific behaviors that could be improved.    Nutrition Goals Re-Evaluation:   Nutrition Goals Discharge (Final Nutrition Goals Re-Evaluation):   Psychosocial: Target Goals: Acknowledge presence or absence of significant depression and/or stress, maximize coping skills, provide positive support system. Participant is able to verbalize types and ability to use techniques and skills needed for reducing stress and depression.   Education: Stress, Anxiety, and Depression - Group verbal and visual presentation to define topics covered.  Reviews how body is impacted by stress, anxiety, and depression.  Also discusses healthy ways to reduce stress and to treat/manage anxiety and depression.  Written  material given at graduation.   Education: Sleep Hygiene -Provides group verbal and written instruction about how sleep can affect your health.  Define sleep hygiene, discuss sleep cycles and impact of sleep habits. Review good sleep hygiene tips.    Initial Review & Psychosocial Screening:  Initial Psych Review & Screening - 10/02/20 1416      Initial Review   Current issues with Current Stress Concerns    Source of Stress Concerns Unable to perform yard/household activities;Unable to participate in former interests or hobbies      Family Dynamics   Good Support System? Yes   girlfriend     Barriers   Psychosocial barriers to participate in program There are no identifiable barriers or psychosocial needs.      Screening Interventions   Interventions Encouraged to exercise;Provide feedback about the scores to participant;To provide support and resources with identified psychosocial needs    Expected Outcomes Short Term goal: Utilizing psychosocial counselor, staff and physician to assist with identification of specific Stressors or current issues interfering with healing process. Setting desired goal for each stressor or current issue identified.;Long Term Goal: Stressors or current issues are controlled or eliminated.;Short Term goal: Identification and review with participant of any Quality of Life or Depression concerns found by scoring the questionnaire.;Long Term goal: The participant improves quality of Life and PHQ9 Scores as seen by post scores and/or verbalization of changes           Quality of Life Scores:   Quality of Life - 10/08/20 1152      Quality of Life   Select Quality of Life      Quality of Life Scores   Health/Function Pre 20.5 %    Socioeconomic Pre 21.36 %    Psych/Spiritual Pre 22.93 %    Family Pre 22.13 %    GLOBAL Pre 21.42 %          Scores of 19 and below usually indicate a poorer quality of life in these areas.  A difference of  2-3 points is  a clinically meaningful difference.  A difference of 2-3 points in the total score of the Quality of Life Index has been associated with significant improvement in overall quality of life, self-image, physical symptoms, and general health in studies assessing change in quality of life.  PHQ-9: Recent Review Flowsheet Data    Depression screen [facility] 2/9 10/08/2020   Decreased Interest 0   Down, Depressed, Hopeless 1   PHQ - 2 Score 1   Altered sleeping 0   Tired, decreased energy 1   Change in appetite 0   Feeling bad or failure about yourself  0   Trouble concentrating 0   Moving slowly or fidgety/restless 1   Suicidal thoughts  0   PHQ-9 Score 3   Difficult doing work/chores Somewhat difficult     Interpretation of Total Score  Total Score Depression Severity:  1-4 = Minimal depression, 5-9 = Mild depression, 10-14 = Moderate depression, 15-19 = Moderately severe depression, 20-27 = Severe depression   Psychosocial Evaluation and Intervention:  Psychosocial Evaluation - 10/02/20 1421      Psychosocial Evaluation & Interventions   Interventions Encouraged to exercise with the program and follow exercise prescription    Comments Barbara Cower states he is 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 He is on short term disability from work and doesn't know when he will return. His girlfriend has been very supportive through the healing process. All these medicines and doctor appointments are new to him so he is looking forward to feeling more confident in managing his healthcare. He was discharged with a LifeVest and hopes to have it removed in time. He is sleeping okay, but sometimes wakes up because of the rib pain post vfib arrest. He did quit smoking when he was admitted in the hospital and hasn't had many cravings to restart.    Expected Outcomes Short: attend cardiac rehab for education and exercise. Long: develop positive self care habits.    Continue Psychosocial Services  Follow up  required by staff           Psychosocial Re-Evaluation:   Psychosocial Discharge (Final Psychosocial Re-Evaluation):   Vocational Rehabilitation: Provide vocational rehab assistance to qualifying candidates.   Vocational Rehab Evaluation & Intervention:  Vocational Rehab - 10/02/20 1416      Initial Vocational Rehab Evaluation & Intervention   Assessment shows need for Vocational Rehabilitation No           Education: Education Goals: Education classes will be provided on a variety of topics geared toward better understanding of heart health and risk factor modification. Participant will state understanding/return demonstration of topics presented as noted by education test scores.  Learning Barriers/Preferences:  Learning Barriers/Preferences - 10/02/20 1416      Learning Barriers/Preferences   Learning Barriers None    Learning Preferences None           General Cardiac Education Topics:  AED/CPR: - Group verbal and written instruction with the use of models to demonstrate the basic use of the AED with the basic ABC's of resuscitation.   Anatomy and Cardiac Procedures: - Group verbal and visual presentation and models provide information about basic cardiac anatomy and function. Reviews the testing methods done to diagnose heart disease and the outcomes of the test results. Describes the treatment choices: Medical Management, Angioplasty, or Coronary Bypass Surgery for treating various heart conditions including Myocardial Infarction, Angina, Valve Disease, and Cardiac Arrhythmias.  Written material given at graduation.   Medication Safety: - Group verbal and visual instruction to review commonly prescribed medications for heart and lung disease. Reviews the medication, class of the drug, and side effects. Includes the steps to properly store meds and maintain the prescription regimen.  Written material given at graduation.   Intimacy: - Group verbal  instruction through game format to discuss how heart and lung disease can affect sexual intimacy. Written material given at graduation..   Know Your Numbers and Heart Failure: - Group verbal and visual instruction to discuss disease risk factors for cardiac and pulmonary disease and treatment options.  Reviews associated critical values for Overweight/Obesity, Hypertension, Cholesterol, and Diabetes.  Discusses basics of heart failure: signs/symptoms and treatments.  Introduces Heart  Failure Zone chart for action plan for heart failure.  Written material given at graduation.   Infection Prevention: - Provides verbal and written material to individual with discussion of infection control including proper hand washing and proper equipment cleaning during exercise session. Flowsheet Row Cardiac Rehab from 10/08/2020 in [facility] Cardiac and Pulmonary Rehab  Education need identified 10/08/20  Date 10/08/20  Educator KL  Instruction Review Code 1- Verbalizes Understanding      Falls Prevention: - Provides verbal and written material to individual with discussion of falls prevention and safety. Flowsheet Row Cardiac Rehab from 10/08/2020 in [facility] Cardiac and Pulmonary Rehab  Education need identified 10/08/20  Date 10/08/20  Educator KL  Instruction Review Code 1- Verbalizes Understanding      Other: -Provides group and verbal instruction on various topics (see comments)   Knowledge Questionnaire Score:  Knowledge Questionnaire Score - 10/08/20 6962      Knowledge Questionnaire Score   Pre Score 23/26: Heart Failure, Exercise, Nutrition           Core Components/Risk Factors/Patient Goals at Admission:  Personal Goals and Risk Factors at Admission - 10/08/20 1148      Core Components/Risk Factors/Patient Goals on Admission    Weight Management Yes;Weight Loss    Intervention Weight Management: Develop a combined nutrition and exercise program designed to reach desired caloric intake,  while maintaining appropriate intake of nutrient and fiber, sodium and fats, and appropriate energy expenditure required for the weight goal.;Weight Management: Provide education and appropriate resources to help participant work on and attain dietary goals.;Weight Management/Obesity: Establish reasonable short term and long term weight goals.    Admit Weight 178 lb (80.7 kg)    Goal Weight: Short Term 174 lb (78.9 kg)    Goal Weight: Long Term 168 lb (76.2 kg)    Expected Outcomes Short Term: Continue to assess and modify interventions until short term weight is achieved;Long Term: Adherence to nutrition and physical activity/exercise program aimed toward attainment of established weight goal;Weight Loss: Understanding of general recommendations for a balanced deficit meal plan, which promotes 1-2 lb weight loss per week and includes a negative energy balance of [phone] kcal/d;Understanding recommendations for meals to include 15-35% energy as protein, 25-35% energy from fat, 35-60% energy from carbohydrates, less than 200mg  of dietary cholesterol, 20-35 gm of total fiber daily;Understanding of distribution of calorie intake throughout the day with the consumption of 4-5 meals/snacks    Tobacco Cessation Yes   Quit 09/07/20   Intervention Assist the participant in steps to quit. Provide individualized education and counseling about committing to Tobacco Cessation, relapse prevention, and pharmacological support that can be provided by physician.;09/09/20, assist with locating and accessing local/national Quit Smoking programs, and support quit date choice.    Expected Outcomes Short Term: Will demonstrate readiness to quit, by selecting a quit date.;Short Term: Will quit all tobacco product use, adhering to prevention of relapse plan.;Long Term: Complete abstinence from all tobacco products for at least 12 months from quit date.    Heart Failure Yes    Intervention Provide a combined  exercise and nutrition program that is supplemented with education, support and counseling about heart failure. Directed toward relieving symptoms such as shortness of breath, decreased exercise tolerance, and extremity edema.    Expected Outcomes Improve functional capacity of life;Short term: Attendance in program 2-3 days a week with increased exercise capacity. Reported lower sodium intake. Reported increased fruit and vegetable intake. Reports medication compliance.;Short term: Daily weights  obtained and reported for increase. Utilizing diuretic protocols set by physician.;Long term: Adoption of self-care skills and reduction of barriers for early signs and symptoms recognition and intervention leading to self-care maintenance.    Lipids Yes    Intervention Provide education and support for participant on nutrition & aerobic/resistive exercise along with prescribed medications to achieve LDL 70mg , HDL >40mg .    Expected Outcomes Short Term: Participant states understanding of desired cholesterol values and is compliant with medications prescribed. Participant is following exercise prescription and nutrition guidelines.;Long Term: Cholesterol controlled with medications as prescribed, with individualized exercise RX and with personalized nutrition plan. Value goals: LDL < 70mg , HDL > 40 mg.           Education:Diabetes - Individual verbal and written instruction to review signs/symptoms of diabetes, desired ranges of glucose level fasting, after meals and with exercise. Acknowledge that pre and post exercise glucose checks will be done for 3 sessions at entry of program.   Core Components/Risk Factors/Patient Goals Review:    Core Components/Risk Factors/Patient Goals at Discharge (Final Review):    ITP Comments:  ITP Comments    Row Name 10/02/20 1424 10/08/20 0940 10/08/20 0941       ITP Comments Initial telephone orientation completed. Diagnosis can be found in [facility] 3/14. EP  orientation scheduled for 4/14 at 8am. 5/14 has recently quit tobacco use within the last 6 months. Quit date was 09/07/20 after his heart event. Intervention for relapse prevention was provided at the initial medical review. He was encouraged to continue to with tobacco cessation and was provided information on relapse prevention. Patient received information about combination therapy, tobacco cessation classes, quit line, and quit smoking apps in case of a relapse. Patient demonstrated understanding of this material.Staff will continue to provide encouragement and follow up with the patient throughout the program. Completed 09/09/20 and gym orientation. Initial ITP created and sent for review to Dr. , Medical Director.            Comments: Initial ITP

## 2020-10-08 NOTE — Patient Instructions (Signed)
Patient Instructions  Patient Details  Name: Adam Carter MRN: [id number] Date of Birth: Jul 18, 1975 Referring Provider:  Iran Ouch, MD  Below are your personal goals for exercise, nutrition, and risk factors. Our goal is to help you stay on track towards obtaining and maintaining these goals. We will be discussing your progress on these goals with you throughout the program.  Initial Exercise Prescription:  Initial Exercise Prescription - 10/08/20 1100      Date of Initial Exercise RX and Referring Provider   Date 10/08/20    Referring Provider Lorine Bears MD      Treadmill   MPH 2.8    Grade 2    Minutes 15    METs 3.9      NuStep   Level 4    SPM 80    Minutes 15    METs 4.5      Recumbant Elliptical   Level 3    RPM 50    Minutes 15    METs 4.5      REL-XR   Level 4    Speed 50    Minutes 15    METs 4.5      T5 Nustep   Level 3    SPM 80    Minutes 15    METs 4.5      Prescription Details   Frequency (times per week) 3    Duration Progress to 30 minutes of continuous aerobic without signs/symptoms of physical distress      Intensity   THRR 40-80% of Max Heartrate 111-155    Ratings of Perceived Exertion 11-13    Perceived Dyspnea 0-4      Progression   Progression Continue to progress workloads to maintain intensity without signs/symptoms of physical distress.      Resistance Training   Training Prescription Yes    Weight 5 lb    Reps 10-15           Exercise Goals: Frequency: Be able to perform aerobic exercise two to three times per week in program working toward 2-5 days per week of home exercise.  Intensity: Work with a perceived exertion of 11 (fairly light) - 15 (hard) while following your exercise prescription.  We will make changes to your prescription with you as you progress through the program.   Duration: Be able to do 30 to 45 minutes of continuous aerobic exercise in addition to a 5 minute warm-up and a 5 minute  cool-down routine.   Nutrition Goals: Your personal nutrition goals will be established when you do your nutrition analysis with the dietician.  The following are general nutrition guidelines to follow: Cholesterol < 200mg /day Sodium < 1500mg /day Fiber: Men under 50 yrs - 38 grams per day  Personal Goals:  Personal Goals and Risk Factors at Admission - 10/08/20 1148      Core Components/Risk Factors/Patient Goals on Admission    Weight Management Yes;Weight Loss    Intervention Weight Management: Develop a combined nutrition and exercise program designed to reach desired caloric intake, while maintaining appropriate intake of nutrient and fiber, sodium and fats, and appropriate energy expenditure required for the weight goal.;Weight Management: Provide education and appropriate resources to help participant work on and attain dietary goals.;Weight Management/Obesity: Establish reasonable short term and long term weight goals.    Admit Weight 178 lb (80.7 kg)    Goal Weight: Short Term 174 lb (78.9 kg)    Goal Weight: Long Term 168 lb (76.2 kg)  Expected Outcomes Short Term: Continue to assess and modify interventions until short term weight is achieved;Long Term: Adherence to nutrition and physical activity/exercise program aimed toward attainment of established weight goal;Weight Loss: Understanding of general recommendations for a balanced deficit meal plan, which promotes 1-2 lb weight loss per week and includes a negative energy balance of [phone] kcal/d;Understanding recommendations for meals to include 15-35% energy as protein, 25-35% energy from fat, 35-60% energy from carbohydrates, less than 200mg  of dietary cholesterol, 20-35 gm of total fiber daily;Understanding of distribution of calorie intake throughout the day with the consumption of 4-5 meals/snacks    Tobacco Cessation Yes   Quit 09/07/20   Intervention Assist the participant in steps to quit. Provide individualized education  and counseling about committing to Tobacco Cessation, relapse prevention, and pharmacological support that can be provided by physician.;09/09/20, assist with locating and accessing local/national Quit Smoking programs, and support quit date choice.    Expected Outcomes Short Term: Will demonstrate readiness to quit, by selecting a quit date.;Short Term: Will quit all tobacco product use, adhering to prevention of relapse plan.;Long Term: Complete abstinence from all tobacco products for at least 12 months from quit date.    Heart Failure Yes    Intervention Provide a combined exercise and nutrition program that is supplemented with education, support and counseling about heart failure. Directed toward relieving symptoms such as shortness of breath, decreased exercise tolerance, and extremity edema.    Expected Outcomes Improve functional capacity of life;Short term: Attendance in program 2-3 days a week with increased exercise capacity. Reported lower sodium intake. Reported increased fruit and vegetable intake. Reports medication compliance.;Short term: Daily weights obtained and reported for increase. Utilizing diuretic protocols set by physician.;Long term: Adoption of self-care skills and reduction of barriers for early signs and symptoms recognition and intervention leading to self-care maintenance.    Lipids Yes    Intervention Provide education and support for participant on nutrition & aerobic/resistive exercise along with prescribed medications to achieve LDL 70mg , HDL >40mg .    Expected Outcomes Short Term: Participant states understanding of desired cholesterol values and is compliant with medications prescribed. Participant is following exercise prescription and nutrition guidelines.;Long Term: Cholesterol controlled with medications as prescribed, with individualized exercise RX and with personalized nutrition plan. Value goals: LDL < 70mg , HDL > 40 mg.            Tobacco Use Initial Evaluation: Social History   Tobacco Use  Smoking Status Former Smoker  . Quit date: 09/09/2020  . Years since quitting: 0.0  Smokeless Tobacco Never Used    Exercise Goals and Review:  Exercise Goals    Row Name 10/08/20 1147             Exercise Goals   Increase Physical Activity Yes       Intervention Provide advice, education, support and counseling about physical activity/exercise needs.;Develop an individualized exercise prescription for aerobic and resistive training based on initial evaluation findings, risk stratification, comorbidities and participant's personal goals.       Expected Outcomes Short Term: Attend rehab on a regular basis to increase amount of physical activity.;Long Term: Add in home exercise to make exercise part of routine and to increase amount of physical activity.;Long Term: Exercising regularly at least 3-5 days a week.       Increase Strength and Stamina Yes       Intervention Provide advice, education, support and counseling about physical activity/exercise needs.;Develop an individualized exercise prescription for  aerobic and resistive training based on initial evaluation findings, risk stratification, comorbidities and participant's personal goals.       Expected Outcomes Short Term: Increase workloads from initial exercise prescription for resistance, speed, and METs.;Short Term: Perform resistance training exercises routinely during rehab and add in resistance training at home;Long Term: Improve cardiorespiratory fitness, muscular endurance and strength as measured by increased METs and functional capacity ( )       Able to understand and use rate of perceived exertion (RPE) scale Yes       Intervention Provide education and explanation on how to use RPE scale       Expected Outcomes Short Term: Able to use RPE daily in rehab to express subjective intensity level;Long Term:  Able to use RPE to guide intensity level when  exercising independently       Able to understand and use Dyspnea scale Yes       Intervention Provide education and explanation on how to use Dyspnea scale       Expected Outcomes Short Term: Able to use Dyspnea scale daily in rehab to express subjective sense of shortness of breath during exertion;Long Term: Able to use Dyspnea scale to guide intensity level when exercising independently       Knowledge and understanding of Target Heart Rate Range (THRR) Yes       Intervention Provide education and explanation of THRR including how the numbers were predicted and where they are located for reference       Expected Outcomes Short Term: Able to state/look up THRR;Short Term: Able to use daily as guideline for intensity in rehab;Long Term: Able to use THRR to govern intensity when exercising independently       Able to check pulse independently Yes       Intervention Provide education and demonstration on how to check pulse in carotid and radial arteries.;Review the importance of being able to check your own pulse for safety during independent exercise       Expected Outcomes Short Term: Able to explain why pulse checking is important during independent exercise;Long Term: Able to check pulse independently and accurately       Understanding of Exercise Prescription Yes       Intervention Provide education, explanation, and written materials on patient's individual exercise prescription       Expected Outcomes Short Term: Able to explain program exercise prescription;Long Term: Able to explain home exercise prescription to exercise independently              Copy of goals given to participant.

## 2020-10-09 ENCOUNTER — Other Ambulatory Visit: Payer: Self-pay

## 2020-10-09 MED ORDER — FUROSEMIDE 20 MG PO TABS: 20.0000 mg | ORAL_TABLET | Freq: Every day | ORAL | 3 refills | Status: DC | PRN

## 2020-10-12 ENCOUNTER — Other Ambulatory Visit: Payer: Self-pay

## 2020-10-12 ENCOUNTER — Encounter: Payer: No Typology Code available for payment source | Admitting: *Deleted

## 2020-10-12 DIAGNOSIS — I213 ST elevation (STEMI) myocardial infarction of unspecified site: Secondary | ICD-10-CM | POA: Diagnosis not present

## 2020-10-12 DIAGNOSIS — Z955 Presence of coronary angioplasty implant and graft: Secondary | ICD-10-CM

## 2020-10-12 NOTE — Progress Notes (Signed)
Daily Session Note  Patient Details  Name: Adam Carter MRN: [id number] Date of Birth: 12/20/1975 Referring Provider:   Flowsheet Row Cardiac Rehab from 10/08/2020 in [facility] Cardiac and Pulmonary Rehab  Referring Provider Kathlyn Sacramento MD      Encounter Date: 10/12/2020  Check In:  Session Check In - 10/12/20 1724      Check-In   Supervising physician immediately available to respond to emergencies See telemetry face sheet for immediately available ER MD    Location [facility]-Cardiac & Pulmonary Rehab    Staff Present Renita Papa, RN Moises Blood, BS, ACSM CEP, Exercise Physiologist;Melissa Caiola RDN, LDN    Virtual Visit No    Medication changes reported     No    Fall or balance concerns reported    No    Warm-up and Cool-down Performed on first and last piece of equipment    Resistance Training Performed Yes    VAD Patient? No    PAD/SET Patient? No      Pain Assessment   Currently in Pain? No/denies              Social History   Tobacco Use  Smoking Status Former Smoker  . Quit date: 09/09/2020  . Years since quitting: 0.0  Smokeless Tobacco Never Used    Goals Met:  Independence with exercise equipment Exercise tolerated well No report of cardiac concerns or symptoms Strength training completed today  Goals Unmet:  Not Applicable  Comments: First full day of exercise!  Patient was oriented to gym and equipment including functions, settings, policies, and procedures.  Patient's individual exercise prescription and treatment plan were reviewed.  All starting workloads were established based on the results of the 6 minute walk test done at initial orientation visit.  The plan for exercise progression was also introduced and progression will be customized based on patient's performance and goals.    Dr. Emily Filbert is Medical Director for [place] and [facility].

## 2020-10-14 ENCOUNTER — Encounter: Payer: Self-pay | Admitting: *Deleted

## 2020-10-14 ENCOUNTER — Other Ambulatory Visit: Payer: Self-pay

## 2020-10-14 ENCOUNTER — Encounter: Payer: No Typology Code available for payment source | Admitting: *Deleted

## 2020-10-14 DIAGNOSIS — I213 ST elevation (STEMI) myocardial infarction of unspecified site: Secondary | ICD-10-CM

## 2020-10-14 DIAGNOSIS — Z955 Presence of coronary angioplasty implant and graft: Secondary | ICD-10-CM

## 2020-10-14 NOTE — Progress Notes (Signed)
Daily Session Note  Patient Details  Name: Quantay Zaremba MRN: [id number] Date of Birth: [birth year] Referring Provider:   Flowsheet Row Cardiac Rehab from 10/08/2020 in [facility] Cardiac and Pulmonary Rehab  Referring Provider Kathlyn Sacramento MD      Encounter Date: 10/14/2020  Check In:  Session Check In - 10/14/20 1651      Check-In   Supervising physician immediately available to respond to emergencies See telemetry face sheet for immediately available ER MD    Location [facility]-Cardiac & Pulmonary Rehab    Staff Present Renita Papa, RN BSN;Joseph Lou Miner, [place] Exercise Physiologist    Virtual Visit No    Medication changes reported     No    Fall or balance concerns reported    No    Warm-up and Cool-down Performed on first and last piece of equipment    Resistance Training Performed Yes    VAD Patient? No    PAD/SET Patient? No      Pain Assessment   Currently in Pain? No/denies              Social History   Tobacco Use  Smoking Status Former Smoker  . Quit date: 09/09/2020  . Years since quitting: 0.0  Smokeless Tobacco Never Used    Goals Met:  Independence with exercise equipment Exercise tolerated well No report of cardiac concerns or symptoms Strength training completed today  Goals Unmet:  Not Applicable  Comments: Pt able to follow exercise prescription today without complaint.  Will continue to monitor for progression.    Dr. Emily Filbert is Medical Director for [place] and [facility].

## 2020-10-14 NOTE — Progress Notes (Signed)
Cardiac Individual Treatment Plan  Patient Details  Name: Adam Carter MRN: [id number] Date of Birth: 1975/11/23 Referring Provider:   Flowsheet Row Cardiac Rehab from 10/08/2020 in [facility] Cardiac and Pulmonary Rehab  Referring Provider Lorine Bears MD      Initial Encounter Date:  Flowsheet Row Cardiac Rehab from 10/08/2020 in [facility] Cardiac and Pulmonary Rehab  Date 10/08/20      Visit Diagnosis: ST elevation myocardial infarction (STEMI), unspecified artery [facility])  Status post coronary artery stent placement  Patient's Home Medications on Admission:  Current Outpatient Medications:  .  aspirin 81 MG chewable tablet, Chew 1 tablet (81 mg total) by mouth daily., Disp: 30 tablet, Rfl: 2 .  atorvastatin (LIPITOR) 80 MG tablet, Take 1 tablet (80 mg total) by mouth daily at 6 PM., Disp: 30 tablet, Rfl: 3 .  fluticasone (FLONASE) 50 MCG/ACT nasal spray, Place 1 spray into both nostrils daily as needed., Disp: , Rfl:  .  furosemide (LASIX) 20 MG tablet, Take 1 tablet (20 mg total) by mouth daily as needed., Disp: 90 tablet, Rfl: 3 .  Multiple Vitamin (MULTIVITAMIN WITH MINERALS) TABS tablet, Take 1 tablet by mouth daily., Disp: 30 tablet, Rfl: 0 .  pantoprazole (PROTONIX) 20 MG tablet, Take 1 tablet (20 mg total) by mouth daily., Disp: 30 tablet, Rfl: 5 .  pregabalin (LYRICA) 75 MG capsule, Take 75 mg by mouth 3 (three) times daily., Disp: , Rfl:  .  ticagrelor (BRILINTA) 90 MG TABS tablet, Take 1 tablet (90 mg total) by mouth 2 (two) times daily., Disp: 60 tablet, Rfl: 3  Past Medical History: Past Medical History:  Diagnosis Date  . CHF (congestive heart failure) (HCC)   . Coronary artery disease    occluded ostial LAD with otherwise no obstructive disease.   PCI/DES to ostial LAD  . HFrEF (heart failure with reduced ejection fraction) (HCC)    EF 30-35%  . History of cardiac arrest    VF arrest 3/14  . History of ST elevation myocardial infarction (STEMI)    09/07/20 anteriolateral  STEMI  . History of tobacco use   . History of ventricular fibrillation    3/14  . Hyperlipidemia LDL goal <70     Tobacco Use: Social History   Tobacco Use  Smoking Status Former Smoker  . Quit date: 09/09/2020  . Years since quitting: 0.0  Smokeless Tobacco Never Used    Labs: Recent Review Flowsheet Data    Labs for ITP Cardiac and Pulmonary Rehab Latest Ref Rng & Units 09/07/2020 09/08/2020 09/09/2020 09/11/2020 09/16/2020   Cholestrol 0 - 200 mg/dL - - 315 - 176   LDLCALC 0 - 99 mg/dL - - 75 - 70   HDL >16 mg/dL - - 07(P) - 71(G)   Trlycerides <150 mg/dL - 626 86 948(N) 462   Hemoglobin A1c 4.8 - 5.6 % - 6.0(H) - - -   PHART 7.350 - 7.450 7.30(L) 7.33(L) - - -   PCO2ART 32.0 - 48.0 mmHg 49(H) 43 - - -   HCO3 20.0 - 28.0 mmol/L 24.1 22.7 - - -   ACIDBASEDEF 0.0 - 2.0 mmol/L 2.8(H) 3.2(H) - - -   O2SAT % 98.2 98.7 - 67.7 -       Exercise Target Goals: Exercise Program Goal: Individual exercise prescription set using results from initial 6 min walk test and THRR while considering  patient's activity barriers and safety.   Exercise Prescription Goal: Initial exercise prescription builds to 30-45 minutes a day of  aerobic activity, 2-3 days per week.  Home exercise guidelines will be given to patient during program as part of exercise prescription that the participant will acknowledge.   Education: Aerobic Exercise: - Group verbal and visual presentation on the components of exercise prescription. Introduces F.I.T.T principle from ACSM for exercise prescriptions.  Reviews F.I.T.T. principles of aerobic exercise including progression. Written material given at graduation.   Education: Resistance Exercise: - Group verbal and visual presentation on the components of exercise prescription. Introduces F.I.T.T principle from ACSM for exercise prescriptions  Reviews F.I.T.T. principles of resistance exercise including progression. Written material given at graduation.     Education: Exercise & Equipment Safety: - Individual verbal instruction and demonstration of equipment use and safety with use of the equipment. Flowsheet Row Cardiac Rehab from 10/08/2020 in [facility] Cardiac and Pulmonary Rehab  Education need identified 10/08/20  Date 10/08/20  Educator KL  Instruction Review Code 1- Verbalizes Understanding      Education: Exercise Physiology & General Exercise Guidelines: - Group verbal and written instruction with models to review the exercise physiology of the cardiovascular system and associated critical values. Provides general exercise guidelines with specific guidelines to those with heart or lung disease.    Education: Flexibility, Balance, Mind/Body Relaxation: - Group verbal and visual presentation with interactive activity on the components of exercise prescription. Introduces F.I.T.T principle from ACSM for exercise prescriptions. Reviews F.I.T.T. principles of flexibility and balance exercise training including progression. Also discusses the mind body connection.  Reviews various relaxation techniques to help reduce and manage stress (i.e. Deep breathing, progressive muscle relaxation, and visualization). Balance handout provided to take home. Written material given at graduation.   Activity Barriers & Risk Stratification:  Activity Barriers & Cardiac Risk Stratification - 10/08/20 0932      Activity Barriers & Cardiac Risk Stratification   Activity Barriers Neck/Spine Problems;Back Problems;Other (comment)    Comments Rib pain from chest compressions    Cardiac Risk Stratification High           6 Minute Walk:  6 Minute Walk    Row Name 10/08/20 1419         6 Minute Walk   Phase Initial     Distance 1465 feet     Walk Time 6 minutes     # of Rest Breaks 0     MPH 2.77     METS 4.54     RPE 7     Perceived Dyspnea  1     VO2 Peak 15.91     Symptoms No     Resting HR 68 bpm     Resting BP 106/68     Resting Oxygen  Saturation  96 %     Exercise Oxygen Saturation  during 6 min walk 97 %     Max Ex. HR 97 bpm     Max Ex. BP 116/68     2 Minute Post BP 108/70            Oxygen Initial Assessment:   Oxygen Re-Evaluation:   Oxygen Discharge (Final Oxygen Re-Evaluation):   Initial Exercise Prescription:  Initial Exercise Prescription - 10/08/20 1100      Date of Initial Exercise RX and Referring Provider   Date 10/08/20    Referring Provider Lorine Bears MD      Treadmill   MPH 2.8    Grade 2    Minutes 15    METs 3.9      NuStep  Level 4    SPM 80    Minutes 15    METs 4.5      Recumbant Elliptical   Level 3    RPM 50    Minutes 15    METs 4.5      REL-XR   Level 4    Speed 50    Minutes 15    METs 4.5      T5 Nustep   Level 3    SPM 80    Minutes 15    METs 4.5      Prescription Details   Frequency (times per week) 3    Duration Progress to 30 minutes of continuous aerobic without signs/symptoms of physical distress      Intensity   THRR 40-80% of Max Heartrate 111-155    Ratings of Perceived Exertion 11-13    Perceived Dyspnea 0-4      Progression   Progression Continue to progress workloads to maintain intensity without signs/symptoms of physical distress.      Resistance Training   Training Prescription Yes    Weight 5 lb    Reps 10-15           Perform Capillary Blood Glucose checks as needed.  Exercise Prescription Changes:  Exercise Prescription Changes    Row Name 10/08/20 1100             Response to Exercise   Blood Pressure (Admit) 106/68       Blood Pressure (Exercise) 116/68       Blood Pressure (Exit) 108/70       Heart Rate (Admit) 68 bpm       Heart Rate (Exercise) 97 bpm       Heart Rate (Exit) 66 bpm       Oxygen Saturation (Admit) 96 %       Oxygen Saturation (Exercise) 97 %       Oxygen Saturation (Exit) 97 %       Rating of Perceived Exertion (Exercise) 7       Perceived Dyspnea (Exercise) 1       Symptoms  none       Comments walk test results              Exercise Comments:   Exercise Goals and Review:  Exercise Goals    Row Name 10/08/20 1147             Exercise Goals   Increase Physical Activity Yes       Intervention Provide advice, education, support and counseling about physical activity/exercise needs.;Develop an individualized exercise prescription for aerobic and resistive training based on initial evaluation findings, risk stratification, comorbidities and participant's personal goals.       Expected Outcomes Short Term: Attend rehab on a regular basis to increase amount of physical activity.;Long Term: Add in home exercise to make exercise part of routine and to increase amount of physical activity.;Long Term: Exercising regularly at least 3-5 days a week.       Increase Strength and Stamina Yes       Intervention Provide advice, education, support and counseling about physical activity/exercise needs.;Develop an individualized exercise prescription for aerobic and resistive training based on initial evaluation findings, risk stratification, comorbidities and participant's personal goals.       Expected Outcomes Short Term: Increase workloads from initial exercise prescription for resistance, speed, and METs.;Short Term: Perform resistance training exercises routinely during rehab and add in resistance training at home;Long Term:  Improve cardiorespiratory fitness, muscular endurance and strength as measured by increased METs and functional capacity ( )       Able to understand and use rate of perceived exertion (RPE) scale Yes       Intervention Provide education and explanation on how to use RPE scale       Expected Outcomes Short Term: Able to use RPE daily in rehab to express subjective intensity level;Long Term:  Able to use RPE to guide intensity level when exercising independently       Able to understand and use Dyspnea scale Yes       Intervention Provide education  and explanation on how to use Dyspnea scale       Expected Outcomes Short Term: Able to use Dyspnea scale daily in rehab to express subjective sense of shortness of breath during exertion;Long Term: Able to use Dyspnea scale to guide intensity level when exercising independently       Knowledge and understanding of Target Heart Rate Range (THRR) Yes       Intervention Provide education and explanation of THRR including how the numbers were predicted and where they are located for reference       Expected Outcomes Short Term: Able to state/look up THRR;Short Term: Able to use daily as guideline for intensity in rehab;Long Term: Able to use THRR to govern intensity when exercising independently       Able to check pulse independently Yes       Intervention Provide education and demonstration on how to check pulse in carotid and radial arteries.;Review the importance of being able to check your own pulse for safety during independent exercise       Expected Outcomes Short Term: Able to explain why pulse checking is important during independent exercise;Long Term: Able to check pulse independently and accurately       Understanding of Exercise Prescription Yes       Intervention Provide education, explanation, and written materials on patient's individual exercise prescription       Expected Outcomes Short Term: Able to explain program exercise prescription;Long Term: Able to explain home exercise prescription to exercise independently              Exercise Goals Re-Evaluation :  Exercise Goals Re-Evaluation    Row Name 10/12/20 1726             Exercise Goal Re-Evaluation   Exercise Goals Review Able to understand and use rate of perceived exertion (RPE) scale;Increase Physical Activity;Knowledge and understanding of Target Heart Rate Range (THRR);Understanding of Exercise Prescription;Increase Strength and Stamina;Able to check pulse independently       Comments Reviewed RPE and dyspnea  scales, THR and program prescription with pt today.  Pt voiced understanding and was given a copy of goals to take home.       Expected Outcomes Short: Use RPE daily to regulate intensity. Long: Follow program prescription in THR.              Discharge Exercise Prescription (Final Exercise Prescription Changes):  Exercise Prescription Changes - 10/08/20 1100      Response to Exercise   Blood Pressure (Admit) 106/68    Blood Pressure (Exercise) 116/68    Blood Pressure (Exit) 108/70    Heart Rate (Admit) 68 bpm    Heart Rate (Exercise) 97 bpm    Heart Rate (Exit) 66 bpm    Oxygen Saturation (Admit) 96 %    Oxygen Saturation (Exercise) 97 %  Oxygen Saturation (Exit) 97 %    Rating of Perceived Exertion (Exercise) 7    Perceived Dyspnea (Exercise) 1    Symptoms none    Comments walk test results           Nutrition:  Target Goals: Understanding of nutrition guidelines, daily intake of sodium 1500mg , cholesterol 200mg , calories 30% from fat and 7% or less from saturated fats, daily to have 5 or more servings of fruits and vegetables.  Education: All About Nutrition: -Group instruction provided by verbal, written material, interactive activities, discussions, models, and posters to present general guidelines for heart healthy nutrition including fat, fiber, MyPlate, the role of sodium in heart healthy nutrition, utilization of the nutrition label, and utilization of this knowledge for meal planning. Follow up email sent as well. Written material given at graduation.   Biometrics:  Pre Biometrics - 10/08/20 0932      Pre Biometrics   Height 5' 7.75" (1.721 m)    Weight 178 lb (80.7 kg)    BMI (Calculated) 27.26    Single Leg Stand 30 seconds            Nutrition Therapy Plan and Nutrition Goals:   Nutrition Assessments:  MEDIFICTS Score Key:  ?70 Need to make dietary changes   40-70 Heart Healthy Diet  ? 40 Therapeutic Level Cholesterol Diet  Flowsheet  Row Cardiac Rehab from 10/08/2020 in [facility] Cardiac and Pulmonary Rehab  Picture Your Plate Total Score on Admission 1975/07/15     Picture Your Plate Scores:  <40 Unhealthy dietary pattern with much room for improvement.  41-50 Dietary pattern unlikely to meet recommendations for good health and room for improvement.  51-60 More healthful dietary pattern, with some room for improvement.   >60 Healthy dietary pattern, although there may be some specific behaviors that could be improved.    Nutrition Goals Re-Evaluation:   Nutrition Goals Discharge (Final Nutrition Goals Re-Evaluation):   Psychosocial: Target Goals: Acknowledge presence or absence of significant depression and/or stress, maximize coping skills, provide positive support system. Participant is able to verbalize types and ability to use techniques and skills needed for reducing stress and depression.   Education: Stress, Anxiety, and Depression - Group verbal and visual presentation to define topics covered.  Reviews how body is impacted by stress, anxiety, and depression.  Also discusses healthy ways to reduce stress and to treat/manage anxiety and depression.  Written material given at graduation.   Education: Sleep Hygiene -Provides group verbal and written instruction about how sleep can affect your health.  Define sleep hygiene, discuss sleep cycles and impact of sleep habits. Review good sleep hygiene tips.    Initial Review & Psychosocial Screening:  Initial Psych Review & Screening - 10/02/20 1416      Initial Review   Current issues with Current Stress Concerns    Source of Stress Concerns Unable to perform yard/household activities;Unable to participate in former interests or hobbies      Family Dynamics   Good Support System? Yes   girlfriend     Barriers   Psychosocial barriers to participate in program There are no identifiable barriers or psychosocial needs.      Screening Interventions   Interventions  Encouraged to exercise;Provide feedback about the scores to participant;To provide support and resources with identified psychosocial needs    Expected Outcomes Short Term goal: Utilizing psychosocial counselor, staff and physician to assist with identification of specific Stressors or current issues interfering with healing process. Setting desired  goal for each stressor or current issue identified.;Long Term Goal: Stressors or current issues are controlled or eliminated.;Short Term goal: Identification and review with participant of any Quality of Life or Depression concerns found by scoring the questionnaire.;Long Term goal: The participant improves quality of Life and PHQ9 Scores as seen by post scores and/or verbalization of changes           Quality of Life Scores:   Quality of Life - 10/08/20 1152      Quality of Life   Select Quality of Life      Quality of Life Scores   Health/Function Pre 20.5 %    Socioeconomic Pre 21.36 %    Psych/Spiritual Pre 22.93 %    Family Pre 22.13 %    GLOBAL Pre 21.42 %          Scores of 19 and below usually indicate a poorer quality of life in these areas.  A difference of  2-3 points is a clinically meaningful difference.  A difference of 2-3 points in the total score of the Quality of Life Index has been associated with significant improvement in overall quality of life, self-image, physical symptoms, and general health in studies assessing change in quality of life.  PHQ-9: Recent Review Flowsheet Data    Depression screen [facility] 2/9 10/08/2020   Decreased Interest 0   Down, Depressed, Hopeless 1   PHQ - 2 Score 1   Altered sleeping 0   Tired, decreased energy 1   Change in appetite 0   Feeling bad or failure about yourself  0   Trouble concentrating 0   Moving slowly or fidgety/restless 1   Suicidal thoughts 0   PHQ-9 Score 3   Difficult doing work/chores Somewhat difficult     Interpretation of Total Score  Total Score Depression  Severity:  1-4 = Minimal depression, 5-9 = Mild depression, 10-14 = Moderate depression, 15-19 = Moderately severe depression, 20-27 = Severe depression   Psychosocial Evaluation and Intervention:  Psychosocial Evaluation - 10/02/20 1421      Psychosocial Evaluation & Interventions   Interventions Encouraged to exercise with the program and follow exercise prescription    Comments Barbara Cower states he is 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 He is on short term disability from work and doesn't know when he will return. His girlfriend has been very supportive through the healing process. All these medicines and doctor appointments are new to him so he is looking forward to feeling more confident in managing his healthcare. He was discharged with a LifeVest and hopes to have it removed in time. He is sleeping okay, but sometimes wakes up because of the rib pain post vfib arrest. He did quit smoking when he was admitted in the hospital and hasn't had many cravings to restart.    Expected Outcomes Short: attend cardiac rehab for education and exercise. Long: develop positive self care habits.    Continue Psychosocial Services  Follow up required by staff           Psychosocial Re-Evaluation:   Psychosocial Discharge (Final Psychosocial Re-Evaluation):   Vocational Rehabilitation: Provide vocational rehab assistance to qualifying candidates.   Vocational Rehab Evaluation & Intervention:  Vocational Rehab - 10/02/20 1416      Initial Vocational Rehab Evaluation & Intervention   Assessment shows need for Vocational Rehabilitation No           Education: Education Goals: Education classes will be provided on a variety  of topics geared toward better understanding of heart health and risk factor modification. Participant will state understanding/return demonstration of topics presented as noted by education test scores.  Learning Barriers/Preferences:  Learning  Barriers/Preferences - 10/02/20 1416      Learning Barriers/Preferences   Learning Barriers None    Learning Preferences None           General Cardiac Education Topics:  AED/CPR: - Group verbal and written instruction with the use of models to demonstrate the basic use of the AED with the basic ABC's of resuscitation.   Anatomy and Cardiac Procedures: - Group verbal and visual presentation and models provide information about basic cardiac anatomy and function. Reviews the testing methods done to diagnose heart disease and the outcomes of the test results. Describes the treatment choices: Medical Management, Angioplasty, or Coronary Bypass Surgery for treating various heart conditions including Myocardial Infarction, Angina, Valve Disease, and Cardiac Arrhythmias.  Written material given at graduation.   Medication Safety: - Group verbal and visual instruction to review commonly prescribed medications for heart and lung disease. Reviews the medication, class of the drug, and side effects. Includes the steps to properly store meds and maintain the prescription regimen.  Written material given at graduation.   Intimacy: - Group verbal instruction through game format to discuss how heart and lung disease can affect sexual intimacy. Written material given at graduation..   Know Your Numbers and Heart Failure: - Group verbal and visual instruction to discuss disease risk factors for cardiac and pulmonary disease and treatment options.  Reviews associated critical values for Overweight/Obesity, Hypertension, Cholesterol, and Diabetes.  Discusses basics of heart failure: signs/symptoms and treatments.  Introduces Heart Failure Zone chart for action plan for heart failure.  Written material given at graduation.   Infection Prevention: - Provides verbal and written material to individual with discussion of infection control including proper hand washing and proper equipment cleaning during  exercise session. Flowsheet Row Cardiac Rehab from 10/08/2020 in [facility] Cardiac and Pulmonary Rehab  Education need identified 10/08/20  Date 10/08/20  Educator KL  Instruction Review Code 1- Verbalizes Understanding      Falls Prevention: - Provides verbal and written material to individual with discussion of falls prevention and safety. Flowsheet Row Cardiac Rehab from 10/08/2020 in [facility] Cardiac and Pulmonary Rehab  Education need identified 10/08/20  Date 10/08/20  Educator KL  Instruction Review Code 1- Verbalizes Understanding      Other: -Provides group and verbal instruction on various topics (see comments)   Knowledge Questionnaire Score:  Knowledge Questionnaire Score - 10/08/20 1779      Knowledge Questionnaire Score   Pre Score 23/26: Heart Failure, Exercise, Nutrition           Core Components/Risk Factors/Patient Goals at Admission:  Personal Goals and Risk Factors at Admission - 10/08/20 1148      Core Components/Risk Factors/Patient Goals on Admission    Weight Management Yes;Weight Loss    Intervention Weight Management: Develop a combined nutrition and exercise program designed to reach desired caloric intake, while maintaining appropriate intake of nutrient and fiber, sodium and fats, and appropriate energy expenditure required for the weight goal.;Weight Management: Provide education and appropriate resources to help participant work on and attain dietary goals.;Weight Management/Obesity: Establish reasonable short term and long term weight goals.    Admit Weight 178 lb (80.7 kg)    Goal Weight: Short Term 174 lb (78.9 kg)    Goal Weight: Long Term 168 lb (76.2 kg)  Expected Outcomes Short Term: Continue to assess and modify interventions until short term weight is achieved;Long Term: Adherence to nutrition and physical activity/exercise program aimed toward attainment of established weight goal;Weight Loss: Understanding of general recommendations for a  balanced deficit meal plan, which promotes 1-2 lb weight loss per week and includes a negative energy balance of [phone] kcal/d;Understanding recommendations for meals to include 15-35% energy as protein, 25-35% energy from fat, 35-60% energy from carbohydrates, less than 200mg  of dietary cholesterol, 20-35 gm of total fiber daily;Understanding of distribution of calorie intake throughout the day with the consumption of 4-5 meals/snacks    Tobacco Cessation Yes   Quit 09/07/20   Intervention Assist the participant in steps to quit. Provide individualized education and counseling about committing to Tobacco Cessation, relapse prevention, and pharmacological support that can be provided by physician.;Education officer, environmentalffer self-teaching materials, assist with locating and accessing local/national Quit Smoking programs, and support quit date choice.    Expected Outcomes Short Term: Will demonstrate readiness to quit, by selecting a quit date.;Short Term: Will quit all tobacco product use, adhering to prevention of relapse plan.;Long Term: Complete abstinence from all tobacco products for at least 12 months from quit date.    Heart Failure Yes    Intervention Provide a combined exercise and nutrition program that is supplemented with education, support and counseling about heart failure. Directed toward relieving symptoms such as shortness of breath, decreased exercise tolerance, and extremity edema.    Expected Outcomes Improve functional capacity of life;Short term: Attendance in program 2-3 days a week with increased exercise capacity. Reported lower sodium intake. Reported increased fruit and vegetable intake. Reports medication compliance.;Short term: Daily weights obtained and reported for increase. Utilizing diuretic protocols set by physician.;Long term: Adoption of self-care skills and reduction of barriers for early signs and symptoms recognition and intervention leading to self-care maintenance.    Lipids Yes     Intervention Provide education and support for participant on nutrition & aerobic/resistive exercise along with prescribed medications to achieve LDL 70mg , HDL >40mg .    Expected Outcomes Short Term: Participant states understanding of desired cholesterol values and is compliant with medications prescribed. Participant is following exercise prescription and nutrition guidelines.;Long Term: Cholesterol controlled with medications as prescribed, with individualized exercise RX and with personalized nutrition plan. Value goals: LDL < 70mg , HDL > 40 mg.           Education:Diabetes - Individual verbal and written instruction to review signs/symptoms of diabetes, desired ranges of glucose level fasting, after meals and with exercise. Acknowledge that pre and post exercise glucose checks will be done for 3 sessions at entry of program.   Core Components/Risk Factors/Patient Goals Review:    Core Components/Risk Factors/Patient Goals at Discharge (Final Review):    ITP Comments:  ITP Comments    Row Name 10/02/20 1424 10/08/20 0940 10/08/20 0941 10/12/20 1725 10/14/20 0945   ITP Comments Initial telephone orientation completed. [facility] EP orientation scheduled for 4/14 at 8am. Barbara CowerJason has recently quit tobacco use within the last 6 months. Quit date was 09/07/20 after his heart event. Intervention for relapse prevention was provided at the initial medical review. He was encouraged to continue to with tobacco cessation and was provided information on relapse prevention. Patient received information about combination therapy, tobacco cessation classes, quit line, and quit smoking apps in case of a relapse. Patient demonstrated understanding of this material.Staff will continue to provide encouragement and follow up with the patient throughout  the program. Completed and gym orientation. Initial ITP created and sent for review to Dr. Bethann Punches, Medical Director. First full  day of exercise!  Patient was oriented to gym and equipment including functions, settings, policies, and procedures.  Patient's individual exercise prescription and treatment plan were reviewed.  All starting workloads were established based on the results of the 6 minute walk test done at initial orientation visit.  The plan for exercise progression was also introduced and progression will be customized based on patient's performance and goals. 30 Day review completed. Medical Director ITP review done, changes made as directed, and signed approval by Medical Director.   new to program          Comments:

## 2020-10-15 ENCOUNTER — Encounter: Payer: No Typology Code available for payment source | Admitting: *Deleted

## 2020-10-15 DIAGNOSIS — I213 ST elevation (STEMI) myocardial infarction of unspecified site: Secondary | ICD-10-CM | POA: Diagnosis not present

## 2020-10-15 DIAGNOSIS — Z955 Presence of coronary angioplasty implant and graft: Secondary | ICD-10-CM

## 2020-10-15 NOTE — Progress Notes (Signed)
Daily Session Note  Patient Details  Name: Xzaiver Vayda MRN: [id number] Date of Birth: Oct 13, 1975 Referring Provider:   Flowsheet Row Cardiac Rehab from 10/08/2020 in [facility] Cardiac [facility] Pulmonary Rehab  Referring Provider Kathlyn Sacramento MD      Encounter Date: 10/15/2020  Check In:  Session Check In - 10/15/20 1708      Check-In   Supervising physician immediately available to respond to emergencies See telemetry face sheet for immediately available ER MD    Location [facility]-Cardiac & Pulmonary Rehab    Staff Present Renita Papa, RN BSN;Joseph Lou Miner, [place] Exercise Physiologist    Virtual Visit No    Medication changes reported     No    Fall or balance concerns reported    No    Warm-up and Cool-down Performed on first and last piece of equipment    Resistance Training Performed Yes    VAD Patient? No    PAD/SET Patient? No      Pain Assessment   Currently in Pain? No/denies              Social History   Tobacco Use  Smoking Status Former Smoker  . Quit date: 09/09/2020  . Years since quitting: 0.0  Smokeless Tobacco Never Used    Goals Met:  Independence with exercise equipment Exercise tolerated well No report of cardiac concerns or symptoms Strength training completed today  Goals Unmet:  Not Applicable  Comments: Pt able to follow exercise prescription today without complaint.  Will continue to monitor for progression.    Dr. Emily Filbert is Medical Director for [place] and [facility].

## 2020-10-19 ENCOUNTER — Encounter: Payer: No Typology Code available for payment source | Admitting: *Deleted

## 2020-10-19 ENCOUNTER — Other Ambulatory Visit: Payer: Self-pay

## 2020-10-19 DIAGNOSIS — Z955 Presence of coronary angioplasty implant and graft: Secondary | ICD-10-CM

## 2020-10-19 DIAGNOSIS — I213 ST elevation (STEMI) myocardial infarction of unspecified site: Secondary | ICD-10-CM

## 2020-10-19 NOTE — Telephone Encounter (Signed)
Payment completed, receipt mailed to patient

## 2020-10-19 NOTE — Progress Notes (Signed)
Daily Session Note  Patient Details  Name: Hawkin Charo MRN: [id number] Date of Birth: 03/23/1976 Referring Provider:   Flowsheet Row Cardiac Rehab from 10/08/2020 in [facility] Cardiac and Pulmonary Rehab  Referring Provider Kathlyn Sacramento MD      Encounter Date: 10/19/2020  Check In:  Session Check In - 10/19/20 1730      Check-In   Supervising physician immediately available to respond to emergencies See telemetry face sheet for immediately available ER MD    Location [facility]-Cardiac & Pulmonary Rehab    Staff Present Renita Papa, RN Margurite Auerbach, MS Exercise Physiologist;Kelly Amedeo Plenty, BS, ACSM CEP, Exercise Physiologist    Virtual Visit No    Medication changes reported     No    Fall or balance concerns reported    No    Warm-up and Cool-down Performed on first and last piece of equipment    Resistance Training Performed Yes    VAD Patient? No    PAD/SET Patient? No      Pain Assessment   Currently in Pain? No/denies              Social History   Tobacco Use  Smoking Status Former Smoker  . Quit date: 09/09/2020  . Years since quitting: 0.1  Smokeless Tobacco Never Used    Goals Met:  Independence with exercise equipment Exercise tolerated well No report of cardiac concerns or symptoms Strength training completed today  Goals Unmet:  Not Applicable  Comments: Pt able to follow exercise prescription today without complaint.  Will continue to monitor for progression.    Dr. Emily Filbert is Medical Director for [place] and [facility].

## 2020-10-21 ENCOUNTER — Other Ambulatory Visit: Payer: Self-pay

## 2020-10-21 DIAGNOSIS — I213 ST elevation (STEMI) myocardial infarction of unspecified site: Secondary | ICD-10-CM

## 2020-10-21 DIAGNOSIS — Z955 Presence of coronary angioplasty implant and graft: Secondary | ICD-10-CM

## 2020-10-21 NOTE — Progress Notes (Signed)
Daily Session Note  Patient Details  Name: Raynald Rouillard MRN: [id number] Date of Birth: 02/24/1976 Referring Provider:   Flowsheet Row Cardiac Rehab from 10/08/2020 in [facility] Cardiac and Pulmonary Rehab  Referring Provider Kathlyn Sacramento MD      Encounter Date: 10/21/2020  Check In:  Session Check In - 10/21/20 1710      Check-In   Supervising physician immediately available to respond to emergencies See telemetry face sheet for immediately available ER MD    Location [facility]-Cardiac & Pulmonary Rehab    Staff Present Birdie Sons, MPA, RN;Joseph Lou Miner, MS Exercise Physiologist;Melissa Caiola RDN, LDN    Virtual Visit No    Medication changes reported     No    Fall or balance concerns reported    No    Warm-up and Cool-down Performed on first and last piece of equipment    Resistance Training Performed Yes    VAD Patient? No    PAD/SET Patient? No      Pain Assessment   Currently in Pain? No/denies              Social History   Tobacco Use  Smoking Status Former Smoker  . Quit date: 09/09/2020  . Years since quitting: 0.1  Smokeless Tobacco Never Used    Goals Met:  Independence with exercise equipment Exercise tolerated well No report of cardiac concerns or symptoms Strength training completed today  Goals Unmet:  Not Applicable  Comments: Pt able to follow exercise prescription today without complaint.  Will continue to monitor for progression.    Dr. Emily Filbert is Medical Director for [place] and [facility].

## 2020-10-22 ENCOUNTER — Encounter: Payer: No Typology Code available for payment source | Admitting: *Deleted

## 2020-10-22 DIAGNOSIS — Z955 Presence of coronary angioplasty implant and graft: Secondary | ICD-10-CM

## 2020-10-22 DIAGNOSIS — I213 ST elevation (STEMI) myocardial infarction of unspecified site: Secondary | ICD-10-CM | POA: Diagnosis not present

## 2020-10-22 NOTE — Progress Notes (Signed)
Daily Session Note  Patient Details  Name: Adam Carter MRN: [id number] Date of Birth: 02/21/1976 Referring Provider:   Flowsheet Row Cardiac Rehab from 10/08/2020 in [facility] Cardiac and Pulmonary Rehab  Referring Provider Kathlyn Sacramento MD      Encounter Date: 10/22/2020  Check In:  Session Check In - 10/22/20 1711      Check-In   Supervising physician immediately available to respond to emergencies See telemetry face sheet for immediately available ER MD    Location [facility]-Cardiac & Pulmonary Rehab    Staff Present Renita Papa, RN BSN;Joseph Hood RCP,RRT,BSRT;Amanda Oletta Darter, [place], ACSM CEP, Exercise Physiologist    Virtual Visit No    Medication changes reported     No    Fall or balance concerns reported    No    Warm-up and Cool-down Performed on first and last piece of equipment    Resistance Training Performed Yes    VAD Patient? No    PAD/SET Patient? No      Pain Assessment   Currently in Pain? No/denies              Social History   Tobacco Use  Smoking Status Former Smoker  . Quit date: 09/09/2020  . Years since quitting: 0.1  Smokeless Tobacco Never Used    Goals Met:  Independence with exercise equipment Exercise tolerated well No report of cardiac concerns or symptoms Strength training completed today  Goals Unmet:  Not Applicable  Comments: Pt able to follow exercise prescription today without complaint.  Will continue to monitor for progression.    Dr. Emily Filbert is Medical Director for [place] and [facility].

## 2020-10-23 ENCOUNTER — Ambulatory Visit (INDEPENDENT_AMBULATORY_CARE_PROVIDER_SITE_OTHER): Payer: No Typology Code available for payment source

## 2020-10-23 ENCOUNTER — Other Ambulatory Visit: Payer: Self-pay

## 2020-10-23 DIAGNOSIS — I252 Old myocardial infarction: Secondary | ICD-10-CM

## 2020-10-23 DIAGNOSIS — I2510 Atherosclerotic heart disease of native coronary artery without angina pectoris: Secondary | ICD-10-CM

## 2020-10-23 LAB — ECHOCARDIOGRAM COMPLETE
AR max vel: 2.18 cm2
AV Area VTI: 2.2 cm2
AV Area mean vel: 2.15 cm2
AV Mean grad: 3 mmHg
AV Peak grad: 6.7 mmHg
Ao pk vel: 1.29 m/s
Area-P 1/2: 3.39 cm2
Calc EF: 54.6 %
S' Lateral: 4.1 cm
Single Plane A2C EF: 61.1 %
Single Plane A4C EF: 47.7 %

## 2020-10-26 ENCOUNTER — Encounter: Payer: Self-pay | Admitting: Physician Assistant

## 2020-10-26 ENCOUNTER — Other Ambulatory Visit: Payer: Self-pay

## 2020-10-26 ENCOUNTER — Ambulatory Visit (INDEPENDENT_AMBULATORY_CARE_PROVIDER_SITE_OTHER): Payer: No Typology Code available for payment source | Admitting: Physician Assistant

## 2020-10-26 ENCOUNTER — Encounter: Payer: No Typology Code available for payment source | Attending: Cardiovascular Disease | Admitting: *Deleted

## 2020-10-26 ENCOUNTER — Other Ambulatory Visit
Admission: RE | Admit: 2020-10-26 | Discharge: 2020-10-26 | Disposition: A | Discharge status: Home / Self Care | Payer: No Typology Code available for payment source | Attending: Physician Assistant | Admitting: Physician Assistant

## 2020-10-26 VITALS — BP 118/78 | HR 76 | Ht 67.75 in | Wt 173.0 lb

## 2020-10-26 DIAGNOSIS — I252 Old myocardial infarction: Secondary | ICD-10-CM | POA: Diagnosis not present

## 2020-10-26 DIAGNOSIS — Z8674 Personal history of sudden cardiac arrest: Secondary | ICD-10-CM | POA: Diagnosis not present

## 2020-10-26 DIAGNOSIS — I213 ST elevation (STEMI) myocardial infarction of unspecified site: Secondary | ICD-10-CM

## 2020-10-26 DIAGNOSIS — I2510 Atherosclerotic heart disease of native coronary artery without angina pectoris: Secondary | ICD-10-CM | POA: Insufficient documentation

## 2020-10-26 DIAGNOSIS — Z8679 Personal history of other diseases of the circulatory system: Secondary | ICD-10-CM

## 2020-10-26 DIAGNOSIS — Z955 Presence of coronary angioplasty implant and graft: Secondary | ICD-10-CM | POA: Insufficient documentation

## 2020-10-26 DIAGNOSIS — Z87891 Personal history of nicotine dependence: Secondary | ICD-10-CM

## 2020-10-26 DIAGNOSIS — I5020 Unspecified systolic (congestive) heart failure: Secondary | ICD-10-CM

## 2020-10-26 DIAGNOSIS — Z0279 Encounter for issue of other medical certificate: Secondary | ICD-10-CM

## 2020-10-26 DIAGNOSIS — R7309 Other abnormal glucose: Secondary | ICD-10-CM

## 2020-10-26 DIAGNOSIS — E785 Hyperlipidemia, unspecified: Secondary | ICD-10-CM

## 2020-10-26 LAB — BASIC METABOLIC PANEL
Anion gap: 9 (ref 5–15)
BUN: 13 mg/dL (ref 6–20)
CO2: 22 mmol/L (ref 22–32)
Calcium: 9.2 mg/dL (ref 8.9–10.3)
Chloride: 107 mmol/L (ref 98–111)
Creatinine, Ser: 0.88 mg/dL (ref 0.61–1.24)
GFR, Estimated: >60 mL/min (ref >60)
Glucose, Bld: 115 mg/dL — ABNORMAL HIGH (ref 70–99)
Potassium: 3.7 mmol/L (ref 3.5–5.1)
Sodium: 138 mmol/L (ref 135–145)

## 2020-10-26 MED ORDER — ENTRESTO 24-26 MG PO TABS: 1.0000 | ORAL_TABLET | Freq: Two times a day (BID) | ORAL | 3 refills | Status: DC

## 2020-10-26 NOTE — Patient Instructions (Signed)
Medication Instructions:  Your physician has recommended you make the following change in your medication:   START Entresto 24/26mg  TWICE daily  *If you need a refill on your cardiac medications before your next appointment, please call your pharmacy*   Lab Work:  Your physician recommends that you have lab work at the medical mall TODAY: BMET -  Please go to the [facility]. You will check in at the front desk to the right as you walk into the atrium.    If you have labs (blood work) drawn today and your tests are completely normal, you will receive your results only by: Marland Kitchen MyChart Message (if you have MyChart) OR . A paper copy in the mail If you have any lab test that is abnormal or we need to change your treatment, we will call you to review the results.   Testing/Procedures: None ordered   Follow-Up: At [facility], you and your health needs are our priority.  As part of our continuing mission to provide you with exceptional heart care, we have created designated Provider Care Teams.  These Care Teams include your primary Cardiologist (physician) and Advanced Practice Providers (APPs -  Physician Assistants and Nurse Practitioners) who all work together to provide you with the care you need, when you need it.  We recommend signing up for the patient portal called "MyChart".  Sign up information is provided on this After Visit Summary.  MyChart is used to connect with patients for Virtual Visits (Telemedicine).  Patients are able to view lab/test results, encounter notes, upcoming appointments, etc.  Non-urgent messages can be sent to your provider as well.   To learn more about what you can do with MyChart, go to [email or web address].    Your next appointment:   1 month(s)  The format for your next appointment:   In Person  Provider:   You may see Lorine Bears, MD or one of the following Advanced Practice Providers on your designated Care Team:     Marisue Ivan, [place]   Other Instructions  Please note after provider advises that you remove the LifeVest, if continued to be worn then you will be charged.

## 2020-10-26 NOTE — Progress Notes (Signed)
Daily Session Note  Patient Details  Name: Adam Carter MRN: [id number] Date of Birth: 11/19/1975 Referring Provider:   Flowsheet Row Cardiac Rehab from 10/08/2020 in [facility] Cardiac and Pulmonary Rehab  Referring Provider Kathlyn Sacramento MD      Encounter Date: 10/26/2020  Check In:  Session Check In - 10/26/20 1714      Check-In   Supervising physician immediately available to respond to emergencies See telemetry face sheet for immediately available ER MD    Location [facility]-Cardiac & Pulmonary Rehab    Staff Present Renita Papa, RN Margurite Auerbach, MS Exercise Physiologist;Kelly Amedeo Plenty, BS, ACSM CEP, Exercise Physiologist    Virtual Visit No    Medication changes reported     No    Fall or balance concerns reported    No    Warm-up and Cool-down Performed on first and last piece of equipment    Resistance Training Performed Yes    VAD Patient? No    PAD/SET Patient? No      Pain Assessment   Currently in Pain? No/denies              Social History   Tobacco Use  Smoking Status Former Smoker  . Quit date: 09/09/2020  . Years since quitting: 0.1  Smokeless Tobacco Never Used    Goals Met:  Independence with exercise equipment Exercise tolerated well No report of cardiac concerns or symptoms Strength training completed today  Goals Unmet:  Not Applicable  Comments: Pt able to follow exercise prescription today without complaint.  Will continue to monitor for progression.    Dr. Emily Filbert is Medical Director for [place] and [facility].

## 2020-10-26 NOTE — Progress Notes (Signed)
Office Visit    Patient Name: Adam Carter Date of Encounter: 10/26/2020  PCP:  System, Provider Not In   [facility]  Cardiologist:  Lorine Bears, MD  Advanced Practice Provider: Marisue Ivan, PA-C Electrophysiologist:  None :[phone]}   Chief Complaint    Chief Complaint  Patient presents with  . Follow-up    1 Month follow up and review echo results. Medications verbally reviewed with patient.    45 year old male with history of VF arrest / cardiac arrest and anterior lateral ST elevation myocardial infarction s/p PCI/DES to occluded ostial LAD 09/07/2020 with clinical course been complicated by aspiration pneumonia, acute on chronic HFrEF/ICM with EF 25 to 35% by LV gram and 30-35% by echo, tobacco use, and seen today for follow-up.  Past Medical History    Past Medical History:  Diagnosis Date  . CHF (congestive heart failure) (HCC)   . Coronary artery disease    occluded ostial LAD with otherwise no obstructive disease.   PCI/DES to ostial LAD  . HFrEF (heart failure with reduced ejection fraction) (HCC)    EF 30-35%  . History of cardiac arrest    VF arrest 3/14  . History of ST elevation myocardial infarction (STEMI)    09/07/20 anteriolateral STEMI  . History of tobacco use   . History of ventricular fibrillation    3/14  . Hyperlipidemia LDL goal <70    Past Surgical History:  Procedure Laterality Date  . CARDIAC CATHETERIZATION    . CORONARY/GRAFT ACUTE MI REVASCULARIZATION N/A 09/07/2020   Procedure: Coronary/Graft Acute MI Revascularization;  Surgeon: Iran Ouch, MD;  Location: [facility] INVASIVE CV LAB;  Service: Cardiovascular;  Laterality: N/A;  . RIGHT/LEFT HEART CATH AND CORONARY ANGIOGRAPHY N/A 09/07/2020   Procedure: RIGHT/LEFT HEART CATH AND CORONARY ANGIOGRAPHY;  Surgeon: Iran Ouch, MD;  Location: [facility] INVASIVE CV LAB;  Service: Cardiovascular;  Laterality: N/A;    Allergies  Allergies  Allergen Reactions   . Bacitracin-Neomycin-Polymyxin Hives  . Bupropion Nausea Only    Passed out    History of Present Illness    Adam Carter is a 45 y.o. male with PMH as above.  Family history includes uncle and father that may have had a heart attack. He had reportedly been dealing with cervical spine issues and had an injection done about a week ago.  He reported intermittent chest pain over the weekend but did not seek medical attention.  He went to work the day of his cardiac event and reported some mild chest discomfort initially, but then after lunch he reported severe chest pain followed by collapse.  Coworkers witnessed the collapse and did CPR.  When EMS arrived, he was in ventricular fibrillation.  He received 2 shocks and a single dose of epinephrine and had ROSC.  Post arrest EKG showed anterior ST elevation myocardial infarction.  STEMI code was activated from the field.  The patient was brought to the ED and his airway was secured with a ET tube.  He had an IV placed and was given heparin.  Emergent catheterization was performed.  Echo showed LVEF 30 to 35%, regional wall motion abnormalities of LV, mild LVH, moderate hypokinesis of the left ventricular, mid apical, anteroseptal wall, anterior wall, and anterior lateral wall.  LGS -5.3%.  Catheterization was performed that showed occluded ostial LAD with otherwise no obstructive disease.  Successful PCI/DES was performed to the ostial LAD.  High-sensitivity troponin peaked at 27,000.  He was extubated  and off of nor epi as of 3/14.  He was started on low-dose losartan 12.5 mg daily.  He was fitted with LifeVest.  Brilinta card given to patient.  Given his bradycardia, beta-blocker was not added.  He was started on oral Lasix 20 mg daily.  Due to aspiration pneumonia/leukocytosis/fever, he was on antibiotics.  When seen in clinic 08/19/20, he reported chest soreness, occasional dizziness, orthopnea, cough with clear phlegm, and a right foot blister. He was  not smoking (quit at discharge). 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  He was drinking 1 beer every 3 to 4 months. Given his low BP, losartan held while diuresis started for 3 days with lasix 20mg  daily. GDMT unable to be escalated due to BP.   Seen 09/23/20 and mainly noted fatigue. On exam, euvolemic. He was with his girlfriend, who reported he was not drinking much fluid. He reported ongoing soreness in his chest but improving. He reported positional CP each AM and CP elicited by coughing. He had cervical neck pain. Cough was improving. He reported dizziness with position changes. He had received his BP cuff to start monitoring at home. He hd not yet started cardiac rehab. At his girlfriend's house, his home wt has been 173-178lbs. He had scant blood in his tissue when blowing his nose or using the restroom but no other s/sx of bleeding. His foot blister was improving. He does not yet have a PCP with # provided. He continued to avoid smoking. He was eating heart healthy with lots of vegetables. He had not yet had his visit with the HF clinic. He continued to wear his LifeVest.   Echo performed with EF improved 40-45%, as below, and as discussed today.  Today 10/26/20, he returns to clinic and is joined by his mother. Echo reviewed with pt and mother. Pt does express a significant amount of anxiety regarding the echo results. He requests that his primary cardiologist read his echo before taking off his LifeVest. He states that he still makes sure he is not alone, as he is afraid of a cardiac event. He wants to be certain that it is safe for LifeVest removal. We discussed that this note and request for Dr. 12/26/20 to read echo will be forwarded to MD this week.  He has started cardiac rehab, which is going well and without any CP or SOB. His chest soreness has significantly improved, though he does notice pops at times with his  sternum. He states at times it feels "weird and pops."   He denies dizziness unless walking and talking a lot. No racing HR or palpitations. No LOC or falls. No s/sx of bleeding.   No s/sx of volume overload. No LEE, abdominal distention, or early satiety. He is only using his lasix on a PRN basis and has not needed it lately. He has overhauled his diet and is avoiding salt. He reports a very heart healthy diet as well. He is still not smoking with congratulations provided.   He notes ongoing soft BP. He has been monitoring BP at home and BP running 95/65 and 115/74 - never goes above SBP 120 and HR 59-77. He reports fairly good energy and ability to complete household activities / cleaning. He does feel "sluggish" after eating. Elevated Hgb A1C again discussed with recommendation he establish with a PCP and pt understanding. [facility] provider number to be provided again today.  He is sleeping his regular routine (per pt) from 1AM-8AM. He reports that he snores loudly at night and has woken himself up from snoring before. He is fatigued with consideration of his recent cardiac event as well. Both his mother and father have tested positive for sleep apnea. He does not think he stops breathing while sleeping but does report that his girlfriend recently stated he was sleeping with his mouth open and breathing so softly she was unsure if he was breathing. No morning HA. Watch Pat discussed and STOP-BANG assessment performed as below under CV studies.   He reports that he still does not feel back to baseline. He knows anxiety may be contributing and is agreeable to establishing with a PCP to discuss this as well. We discussed returning to work with wt restrictions; however, he is concerned that his work will not be able to honor this given the nature of his job.   Home Medications    Current Outpatient Medications on File Prior to Visit  Medication Sig Dispense Refill  . aspirin 81 MG chewable  tablet Chew 1 tablet (81 mg total) by mouth daily. 30 tablet 2  . atorvastatin (LIPITOR) 80 MG tablet Take 1 tablet (80 mg total) by mouth daily at 6 PM. 30 tablet 3  . fluticasone (FLONASE) 50 MCG/ACT nasal spray Place 1 spray into both nostrils daily as needed.    . furosemide (LASIX) 20 MG tablet Take 1 tablet (20 mg total) by mouth daily as needed. 90 tablet 3  . Multiple Vitamin (MULTIVITAMIN WITH MINERALS) TABS tablet Take 1 tablet by mouth daily. 30 tablet 0  . pantoprazole (PROTONIX) 20 MG tablet Take 1 tablet (20 mg total) by mouth daily. 30 tablet 5  . ticagrelor (BRILINTA) 90 MG TABS tablet Take 1 tablet (90 mg total) by mouth 2 (two) times daily. 60 tablet 3   No current facility-administered medications on file prior to visit.    Review of Systems    He denies chest pain like before admission and reports resolution of chest soreness. At times, his sternum feels "weird" with popping. No palpitations, dyspnea, pnd, n, v, syncope, edema, weight gain, or early satiety.  His orthopnea is now resolved. He is fatigued. He is sometimes dizzy if walking and talking. He notes anxiety.  No s/sx of bleeding.All other systems reviewed and are otherwise negative except as noted above.  Physical Exam    VS:  BP 118/78 (BP Location: Left Arm, Patient Position: Sitting, Cuff Size: Normal)   Pulse 76   Ht 5' 7.75" (1.721 m)   Wt 173 lb (78.5 kg)   SpO2 97%   BMI 26.50 kg/m  , BMI Body mass index is 26.5 kg/m. GEN: Well nourished, well developed, in no acute distress. Joined by his mother. HEENT: normal. Neck: Supple, no JVD, carotid bruits, or masses.  Cardiac: Lifevest in place. RRR, no murmurs, rubs, or gallops. No clubbing, cyanosis. No edema.  Radials/DP/PT 2+ and equal bilaterally.   Respiratory:  Respirations regular and unlabored, CTAB. GI: Soft, nontender, nondistended, BS + x 4. MS: no deformity or atrophy. Neuro:  Strength and sensation are intact. Psych: Normal  affect.  Accessory Clinical Findings    ECG personally reviewed by me today -sinus rhythm with sinus arrhythmia, 82 bpm, T wave inversions V1 through V4/aVL seen in previous EKGs  VITALS Reviewed today   Temp Readings from Last 3 Encounters:  09/13/20 99.1 F (37.3 C) (Oral)   BP Readings from Last  3 Encounters:  10/26/20 118/78  10/05/20 107/82  09/23/20 100/72   Pulse Readings from Last 3 Encounters:  10/26/20 76  10/05/20 74  09/23/20 73    Wt Readings from Last 3 Encounters:  10/26/20 173 lb (78.5 kg)  10/08/20 178 lb (80.7 kg)  10/05/20 180 lb 6 oz (81.8 kg)     LABS  reviewed today   Lab Results  Component Value Date   WBC 12.1 (H) 09/16/2020   HGB 13.6 09/16/2020   HCT 40.7 09/16/2020   MCV 92.5 09/16/2020   PLT 393 09/16/2020   Lab Results  Component Value Date   CREATININE 0.88 10/26/2020   BUN 13 10/26/2020   NA 138 10/26/2020   K 3.7 10/26/2020   CL 107 10/26/2020   CO2 22 10/26/2020   Lab Results  Component Value Date   ALT 33 09/16/2020   AST 24 09/16/2020   ALKPHOS 87 09/16/2020   BILITOT 1.3 (H) 09/16/2020   Lab Results  Component Value Date   CHOL 117 09/16/2020   HDL 26 (L) 09/16/2020   LDLCALC 70 09/16/2020   TRIG 106 09/16/2020   CHOLHDL 4.5 09/16/2020    Lab Results  Component Value Date   HGBA1C 6.0 (H) 09/08/2020   No results found for: TSH   STUDIES/PROCEDURES reviewed today   STOP-BANG Rex assessment  S-snore  Have you been told that you snore?   1  T-tired Are you often tired, fatigued, sleepy during the day?   1  O-obstruction Do you stop breathing, choke, or gasp during sleep?   0  P-pressure Do you have or are you being treated for high blood pressure?  0  B- BMI Is your BMI greater than 35 kg/m?  0  A- age Are you 50 years or older?  0  N-neck Do you have a neck circumference greater than 16 inches?  0  G-gender Are you male?   1  Total  3   Risk Score <3 Low risk of OSA ?3 High risk of  OSA  Echo 10/23/20 1. Left ventricular ejection fraction, by estimation, is 40 to 45%. The  left ventricle has mildly decreased function. The left ventricle  demonstrates regional wall motion abnormalities (hypokinesis of the mid to  apical anterior and anteroseptal wall).  Left ventricular diastolic parameters are consistent with Grade II  diastolic dysfunction (pseudonormalization).  2. Right ventricular systolic function is normal. The right ventricular  size is normal.  3. The mitral valve is normal in structure. No evidence of mitral valve  regurgitation. No evidence of mitral stenosis.  Comparison(s): EF 30-35%, moderate hypokinesis LV, mid-apical,  anteroseptal wall, anterior wall, anterolateral wall.  GLS -5.3%.   Right and left heart catheterization 09/07/2020  The left ventricular ejection fraction is 25-35% by visual estimate.  There is moderate to severe left ventricular systolic dysfunction.  LV end diastolic pressure is moderately elevated.  Ost LAD to Prox LAD lesion is 100% stenosed.  Post intervention, there is a 0% residual stenosis.  A drug-eluting stent was successfully placed using a STENT RESOLUTE ONYX 3.5X12.  Mid LAD lesion is 30% stenosed. 1.  Out of hospital cardiac arrest with anterior ST elevation myocardial infarction due to ostial occlusion of the LAD.  No other obstructive disease. 2.  Moderately severely reduced LV systolic function with an EF of 25 to 30% with severe anterior/apical hypokinesis. 3.  Successful angioplasty and drug-eluting stent placement to the ostial LAD. 4.  Right heart catheterization  was performed after PCI and showed mildly elevated filling pressures, mild pulmonary hypertension and normal cardiac output.  PA sat was 78.7%.  Fick cardiac output was 6.17 with a cardiac index of 3.5.  CPO was 1.4.  Based on these hemodynamic numbers, no hemodynamic support was utilized. Recommendations: Dual antiplatelet therapy for at least  1 year. High-dose atorvastatin. We will run Aggrastat infusion for 12 hours given uncertainty of oral absorption of antiplatelet medications. The patient had significant agitation and this will need to be managed.  He is currently on propofol drip and fentanyl drip.  I discussed with Dr. Belia Heman. Recommend electrolyte replacement.  Critical care time of 45 minutes managing abnormal hemodynamics, ventilator management and electrolytes. There is a 0% residual stenosis post intervention.    Wall Motion  Resting                Left Heart  Left Ventricle The left ventricular size is normal. There is moderate to severe left ventricular systolic dysfunction. LV end diastolic pressure is moderately elevated. The left ventricular ejection fraction is 25-35% by visual estimate. There are LV function abnormalities due to segmental dysfunction.   Coronary Diagrams   Diagnostic Dominance: Right    Intervention       Echocardiogram 09/08/2020 1. Left ventricular ejection fraction, by estimation, is 30 to 35%. The  left ventricle has moderately decreased function. The left ventricle  demonstrates regional wall motion abnormalities (see scoring  diagram/findings for description). There is mild  left ventricular hypertrophy. Left ventricular diastolic parameters were  normal. There is moderate hypokinesis of the left ventricular, mid-apical  anteroseptal wall, anterior wall and anterolateral wall. The average left  ventricular global longitudinal  strain is -5.3 %. The global longitudinal strain is abnormal.  2. Right ventricular systolic function is normal. The right ventricular  size is normal. Tricuspid regurgitation signal is inadequate for assessing  PA pressure.  3. The mitral valve is normal in structure. No evidence of mitral valve  regurgitation. No evidence of mitral stenosis.  4. The aortic valve is normal in structure. Aortic valve regurgitation is  not visualized.  No aortic stenosis is present.    Assessment & Plan    Coronary artery disease History of anterior lateral ST elevation myocardial infarction History of VF arrest/cardiac arrest --No chest pain or symptoms similar to that before his 3/14 presentation.   He underwent emergent LHC PCI/DES to the ostial LAD.  LV gram demonstrated EF 25 to 35% with 3/15 echo showing EF 30 to 35%. Most recent 4/29 echo improved above 35% at 40-45% with LV hypokinesis and G2DD. LifeVest removal discussed with pt request to have primary cardiologist look over echo before discontinuation of LifeVest.  BP remains soft today, which has limited escalation of GDMT at previous visits; however, given the slight improvement in BP from low SBP 100s to SBP 118 today, we will attempt small dose Entresto at this time, as his normal BP is usually low 100s to 90s per pt.  Continue DAPT with ASA and Brilinta. Continue Protonix. No beta-blocker at this time. Will start low dose Entresto 24-26 BID and see if well tolerated, despite lower BP, as his normal BP is systolic 100s. Escalate GDMT at RTC if able. Addition of BB at RTC should be considered, as well as future visits could consider SGLT2i and spironolactone (defer for now).  BMET today and RTC.  LifeVest will be continued until Dr. Kirke Corin has reviewed the patient echo.  Chronic HFrEF (EF  30-35%) History of cardiogenic shock --Euvolemic today. Continue PRN dosing of lasix 20mg  daily. Escalation of GDMT at RTC as BP and heart rate allow. Continue LifeVest until primary cardiologist confirms echo findings per pt request. CHF education reviewed again today.   Hyperlipidemia, LDL goal less than 70 --Continue atorvastatin 80 mg daily.  LDL 75 during admission.  Recommend repeat lipid and liver function in 6 to 8 weeks from start of atorvastatin.  History of tobacco use --Ongoing cessation recommended. Congratulations provided for ongoing cessation.  Elevated A1C --3/15 A1c 6.0.   Reviewed diet and lifestyle. Recommend follow-up with PCP --> [facility] number provided. Consider Marcelline DeistFarxiga / SGLT2i at [facility]RTC / future visits.  Disability paperwork --He reports he will touch base with work and may have additional paperwork dropped off at the office  Medication changes: Start Entresto 24-26 with close monitoring of BP and precautions reviewed Labs ordered: BMET today.  lipid and liver in 6-8 weeks. BMET at RTC.  Studies / Imaging ordered: None. Future considerations: Lifevest to be discontinued pending MD to read echo. ?WatchPat at RTC  RTC 1 mo  Lennon AlstromJacquelyn D Heela Heishman, PA-C 10/26/2020

## 2020-10-27 ENCOUNTER — Telehealth: Payer: Self-pay | Admitting: Physician Assistant

## 2020-10-27 ENCOUNTER — Encounter: Payer: Self-pay | Admitting: Physician Assistant

## 2020-10-27 NOTE — Telephone Encounter (Signed)
Received forms from [employer] to be completed Patient filled out forms and paid $29 cash fee  Placed in nurse box

## 2020-10-28 ENCOUNTER — Other Ambulatory Visit: Payer: Self-pay

## 2020-10-28 DIAGNOSIS — I213 ST elevation (STEMI) myocardial infarction of unspecified site: Secondary | ICD-10-CM

## 2020-10-28 DIAGNOSIS — Z955 Presence of coronary angioplasty implant and graft: Secondary | ICD-10-CM

## 2020-10-28 NOTE — Progress Notes (Signed)
Daily Session Note  Patient Details  Name: Guss Farruggia MRN: [id number] Date of Birth: Mar 09, 1976 Referring Provider:   Flowsheet Row Cardiac Rehab from 10/08/2020 in [facility] Cardiac and Pulmonary Rehab  Referring Provider Kathlyn Sacramento MD      Encounter Date: 10/28/2020  Check In:  Session Check In - 10/28/20 1739      Check-In   Supervising physician immediately available to respond to emergencies See telemetry face sheet for immediately available ER MD    Location [facility]-Cardiac & Pulmonary Rehab    Staff Present Birdie Sons, MPA, RN;Joseph Lou Miner, MS Exercise Physiologist;Melissa Caiola RDN, LDN    Virtual Visit No    Medication changes reported     No    Fall or balance concerns reported    No    Warm-up and Cool-down Performed on first and last piece of equipment    Resistance Training Performed Yes    VAD Patient? No    PAD/SET Patient? No      Pain Assessment   Currently in Pain? No/denies              Social History   Tobacco Use  Smoking Status Former Smoker  . Quit date: 09/09/2020  . Years since quitting: 0.1  Smokeless Tobacco Never Used    Goals Met:  Independence with exercise equipment Exercise tolerated well No report of cardiac concerns or symptoms Strength training completed today  Goals Unmet:  Not Applicable  Comments: Pt able to follow exercise prescription today without complaint.  Will continue to monitor for progression.    Dr. Emily Filbert is Medical Director for [place] and [facility].

## 2020-10-29 ENCOUNTER — Encounter: Payer: No Typology Code available for payment source | Admitting: *Deleted

## 2020-10-29 ENCOUNTER — Other Ambulatory Visit: Payer: Self-pay

## 2020-10-29 DIAGNOSIS — I213 ST elevation (STEMI) myocardial infarction of unspecified site: Secondary | ICD-10-CM | POA: Diagnosis not present

## 2020-10-29 DIAGNOSIS — Z955 Presence of coronary angioplasty implant and graft: Secondary | ICD-10-CM

## 2020-10-29 NOTE — Progress Notes (Signed)
Daily Session Note  Patient Details  Name: Adam Carter MRN: [id number] Date of Birth: 09-12-1975 Referring Provider:   Flowsheet Row Cardiac Rehab from 10/08/2020 in [facility] Cardiac and Pulmonary Rehab  Referring Provider Kathlyn Sacramento MD      Encounter Date: 10/29/2020  Check In:  Session Check In - 10/29/20 1718      Check-In   Supervising physician immediately available to respond to emergencies See telemetry face sheet for immediately available ER MD    Location [facility]-Cardiac & Pulmonary Rehab    Staff Present Justin Mend RCP,RRT,BSRT;Steen Bisig Sherryll Burger, RN Margurite Auerbach, MS Exercise Physiologist    Virtual Visit No    Medication changes reported     No    Fall or balance concerns reported    No    Warm-up and Cool-down Performed on first and last piece of equipment    Resistance Training Performed Yes    VAD Patient? No    PAD/SET Patient? No      Pain Assessment   Currently in Pain? No/denies              Social History   Tobacco Use  Smoking Status Former Smoker  . Quit date: 09/09/2020  . Years since quitting: 0.1  Smokeless Tobacco Never Used    Goals Met:  Independence with exercise equipment Exercise tolerated well No report of cardiac concerns or symptoms Strength training completed today  Goals Unmet:  Not Applicable  Comments: Pt able to follow exercise prescription today without complaint.  Will continue to monitor for progression.    Dr. Emily Filbert is Medical Director for [place] and [facility].

## 2020-10-30 NOTE — Telephone Encounter (Signed)
Forms given to [facility] in office today.

## 2020-11-02 ENCOUNTER — Encounter: Payer: No Typology Code available for payment source | Admitting: *Deleted

## 2020-11-02 ENCOUNTER — Other Ambulatory Visit: Payer: Self-pay

## 2020-11-02 DIAGNOSIS — I213 ST elevation (STEMI) myocardial infarction of unspecified site: Secondary | ICD-10-CM | POA: Diagnosis not present

## 2020-11-02 DIAGNOSIS — Z955 Presence of coronary angioplasty implant and graft: Secondary | ICD-10-CM

## 2020-11-02 NOTE — Progress Notes (Signed)
Daily Session Note  Patient Details  Name: Adam Carter MRN: [id number] Date of Birth: 1975-08-09 Referring Provider:   Flowsheet Row Cardiac Rehab from 10/08/2020 in [facility] Cardiac and Pulmonary Rehab  Referring Provider Kathlyn Sacramento MD      Encounter Date: 11/02/2020  Check In:  Session Check In - 11/02/20 [name]      Check-In   Supervising physician immediately available to respond to emergencies See telemetry face sheet for immediately available ER MD    Location [facility]-Cardiac & Pulmonary Rehab    Staff Present Renita Papa, RN Moises Blood, BS, ACSM CEP, Exercise Physiologist;Kara Eliezer Bottom, MS Exercise Physiologist    Virtual Visit No    Medication changes reported     Yes    Comments starting [facility] Friday    Fall or balance concerns reported    No    Warm-up and Cool-down Performed on first and last piece of equipment    Resistance Training Performed Yes    VAD Patient? No    PAD/SET Patient? No      Pain Assessment   Currently in Pain? No/denies              Social History   Tobacco Use  Smoking Status Former Smoker  . Quit date: 09/09/2020  . Years since quitting: 0.1  Smokeless Tobacco Never Used    Goals Met:  Independence with exercise equipment Exercise tolerated well No report of cardiac concerns or symptoms Strength training completed today  Goals Unmet:  Not Applicable  Comments: Pt able to follow exercise prescription today without complaint.  Will continue to monitor for progression.    Dr. Emily Filbert is Medical Director for [place] and [facility].

## 2020-11-02 NOTE — Telephone Encounter (Signed)
Patient calling to check status of form completion and to note forms should be back dated to may 1st .

## 2020-11-03 NOTE — Telephone Encounter (Signed)
Called patient to make him aware forms have been completed however, we need a signature from the patient. Patient states he will come in tomorrow to sign. Paperwork has been placed in the pink FMLA/STD binder

## 2020-11-04 ENCOUNTER — Other Ambulatory Visit: Payer: Self-pay

## 2020-11-04 DIAGNOSIS — I213 ST elevation (STEMI) myocardial infarction of unspecified site: Secondary | ICD-10-CM

## 2020-11-04 DIAGNOSIS — Z955 Presence of coronary angioplasty implant and graft: Secondary | ICD-10-CM

## 2020-11-04 NOTE — Telephone Encounter (Signed)
Patient came to office to sign forms and needs to discuss details of form before submission  Please call

## 2020-11-04 NOTE — Progress Notes (Signed)
Daily Session Note  Patient Details  Name: Adam Carter MRN: [id number] Date of Birth: [birth year] Referring Provider:   Flowsheet Row Cardiac Rehab from 10/08/2020 in [facility] Card[facility]  Referring Provider Kathlyn Sacramento MD      Encounter Date: 11/04/2020  Check In:  Session Check In - 11/04/20 1728      Check-In   Supervising physician immediately available to respond to emergencies See telemetry face sheet for immediately available ER MD    Location [facility]-Cardiac & Pulmonary Rehab    Staff Present Birdie Sons, MPA, RN;Meredith Sherryll Burger, RN BSN;Joseph Lou Miner, [place] Exercise Physiologist    Virtual Visit No    Medication changes reported     No    Fall or balance concerns reported    No    Warm-up and Cool-down Performed on first and last piece of equipment    Resistance Training Performed Yes    VAD Patient? No    PAD/SET Patient? No      Pain Assessment   Currently in Pain? No/denies              Social History   Tobacco Use  Smoking Status Former Smoker  . Quit date: 09/09/2020  . Years since quitting: 0.1  Smokeless Tobacco Never Used    Goals Met:  Independence with exercise equipment Exercise tolerated well No report of cardiac concerns or symptoms Strength training completed today  Goals Unmet:  Not Applicable  Comments: Pt able to follow exercise prescription today without complaint.  Will continue to monitor for progression.    Dr. Emily Filbert is Medical Director for [place] and [facility].

## 2020-11-05 ENCOUNTER — Encounter: Payer: No Typology Code available for payment source | Admitting: *Deleted

## 2020-11-05 ENCOUNTER — Other Ambulatory Visit: Payer: Self-pay

## 2020-11-05 DIAGNOSIS — I213 ST elevation (STEMI) myocardial infarction of unspecified site: Secondary | ICD-10-CM

## 2020-11-05 DIAGNOSIS — Z955 Presence of coronary angioplasty implant and graft: Secondary | ICD-10-CM

## 2020-11-05 NOTE — Progress Notes (Signed)
Daily Session Note  Patient Details  Name: Adam Carter MRN: [id number] Date of Birth: 1975-11-01 Referring Provider:   Flowsheet Row Cardiac Rehab from 10/08/2020 in [facility] Cardiac and Pulmonary Rehab  Referring Provider Kathlyn Sacramento MD      Encounter Date: 11/05/2020  Check In:  Session Check In - 11/05/20 1757      Check-In   Supervising physician immediately available to respond to emergencies See telemetry face sheet for immediately available ER MD    Location [facility]-Cardiac & Pulmonary Rehab    Staff Present Heath Lark, RN, BSN, CCRP;Joseph Foy [place], [place], ACSM CEP, Exercise Physiologist    Virtual Visit No    Medication changes reported     No    Fall or balance concerns reported    No    Warm-up and Cool-down Performed on first and last piece of equipment    Resistance Training Performed Yes    VAD Patient? No    PAD/SET Patient? No      Pain Assessment   Currently in Pain? No/denies              Social History   Tobacco Use  Smoking Status Former Smoker  . Quit date: 09/09/2020  . Years since quitting: 0.1  Smokeless Tobacco Never Used    Goals Met:  Independence with exercise equipment Exercise tolerated well No report of cardiac concerns or symptoms  Goals Unmet:  Not Applicable  Comments: Pt able to follow exercise prescription today without complaint.  Will continue to monitor for progression.    Dr. Emily Filbert is Medical Director for [place] and [facility].

## 2020-11-05 NOTE — Telephone Encounter (Signed)
Spoke to pt. Discussed questions. Pt had originally requested echo to be reviewed by Dr. Kirke Corin.  Recc were given that pt may return to work 11/25/20. Adam Carter will complete paperwork and pt will be notified.  He is to come by the office later today.

## 2020-11-05 NOTE — Telephone Encounter (Signed)
Patient came to office to return sign form for return to work on 11/25/20.    Patient agreed with return date and declined to be seen sooner in office than already scheduled visit on 11/26/20 . He will be going out of town on offered appt date of [birth year] for his [birth year].   Per Patient request completed forms sent to christy at [phone] fax.     Patient verbalized he understood Michaelle Birks to advise him ( unless Kirke Corin stated otherwise) to return his life vest.     Patient states he has no further needs at this time.

## 2020-11-09 ENCOUNTER — Other Ambulatory Visit: Payer: Self-pay

## 2020-11-09 ENCOUNTER — Encounter: Payer: No Typology Code available for payment source | Admitting: *Deleted

## 2020-11-09 DIAGNOSIS — I213 ST elevation (STEMI) myocardial infarction of unspecified site: Secondary | ICD-10-CM

## 2020-11-09 DIAGNOSIS — Z955 Presence of coronary angioplasty implant and graft: Secondary | ICD-10-CM

## 2020-11-09 NOTE — Progress Notes (Signed)
Daily Session Note  Patient Details  Name: Adam Carter MRN: [id number] Date of Birth: 07/22/1975 Referring Provider:   Flowsheet Row Cardiac Rehab from 10/08/2020 in [facility] Cardiac and Pulmonary Rehab  Referring Provider Kathlyn Sacramento MD      Encounter Date: 11/09/2020  Check In:  Session Check In - 11/09/20 [place]      Check-In   Supervising physician immediately available to respond to emergencies See telemetry face sheet for immediately available ER MD    Location [facility]-Cardiac & Pulmonary Rehab    Staff Present Renita Papa, RN BSN;Laureen Owens Shark, BS, RRT, CPFT;Kelly Amedeo Plenty, BS, ACSM CEP, Exercise Physiologist;Amanda Oletta Darter, BA, ACSM CEP, Exercise Physiologist    Virtual Visit No    Medication changes reported     No    Fall or balance concerns reported    No    Warm-up and Cool-down Performed on first and last piece of equipment    Resistance Training Performed Yes    VAD Patient? No    PAD/SET Patient? No      Pain Assessment   Currently in Pain? No/denies              Social History   Tobacco Use  Smoking Status Former Smoker  . Quit date: 09/09/2020  . Years since quitting: 0.1  Smokeless Tobacco Never Used    Goals Met:  Independence with exercise equipment Exercise tolerated well No report of cardiac concerns or symptoms Strength training completed today  Goals Unmet:  Not Applicable  Comments: Pt able to follow exercise prescription today without complaint.  Will continue to monitor for progression.    Dr. Emily Filbert is Medical Director for [place] and [facility].

## 2020-11-11 ENCOUNTER — Other Ambulatory Visit: Payer: Self-pay

## 2020-11-11 ENCOUNTER — Telehealth: Payer: Self-pay | Admitting: Physician Assistant

## 2020-11-11 ENCOUNTER — Encounter: Payer: Self-pay | Admitting: *Deleted

## 2020-11-11 DIAGNOSIS — Z955 Presence of coronary angioplasty implant and graft: Secondary | ICD-10-CM

## 2020-11-11 DIAGNOSIS — I213 ST elevation (STEMI) myocardial infarction of unspecified site: Secondary | ICD-10-CM | POA: Diagnosis not present

## 2020-11-11 NOTE — Progress Notes (Signed)
Daily Session Note  Patient Details  Name: Adam Carter MRN: [id number] Date of Birth: 11/05/1975 Referring Provider:   Flowsheet Row Cardiac Rehab from 10/08/2020 in [facility] Cardiac and Pulmonary Rehab  Referring Provider Kathlyn Sacramento MD      Encounter Date: 11/11/2020  Check In:  Session Check In - 11/11/20 1721      Check-In   Supervising physician immediately available to respond to emergencies See telemetry face sheet for immediately available ER MD    Location [facility]-Cardiac & Pulmonary Rehab    Staff Present Birdie Sons, MPA, RN;Melissa Caiola RDN, LDN;Joseph Lou Miner, MS Exercise Physiologist    Virtual Visit No    Medication changes reported     No    Fall or balance concerns reported    No    Warm-up and Cool-down Performed on first and last piece of equipment    Resistance Training Performed Yes    VAD Patient? No    PAD/SET Patient? No      Pain Assessment   Currently in Pain? No/denies              Social History   Tobacco Use  Smoking Status Former Smoker  . Quit date: 09/09/2020  . Years since quitting: 0.1  Smokeless Tobacco Never Used    Goals Met:  Independence with exercise equipment Exercise tolerated well No report of cardiac concerns or symptoms Strength training completed today  Goals Unmet:  Not Applicable  Comments: Pt able to follow exercise prescription today without complaint.  Will continue to monitor for progression.    Dr. Emily Filbert is Medical Director for [place] and [facility].

## 2020-11-11 NOTE — Progress Notes (Signed)
Cardiac Individual Treatment Plan  Patient Details  Name: Adam Carter MRN: [id number] Date of Birth: 07-24-1975 Referring Provider:   Flowsheet Row Cardiac Rehab from 10/08/2020 in [facility] Cardiac and Pulmonary Rehab  Referring Provider Lorine Bears MD      Initial Encounter Date:  Flowsheet Row Cardiac Rehab from 10/08/2020 in [facility] Cardiac and Pulmonary Rehab  Date 10/08/20      Visit Diagnosis: ST elevation myocardial infarction (STEMI), unspecified artery [facility])  Status post coronary artery stent placement  Patient's Home Medications on Admission:  Current Outpatient Medications:  .  aspirin 81 MG chewable tablet, Chew 1 tablet (81 mg total) by mouth daily., Disp: 30 tablet, Rfl: 2 .  atorvastatin (LIPITOR) 80 MG tablet, Take 1 tablet (80 mg total) by mouth daily at 6 PM., Disp: 30 tablet, Rfl: 3 .  fluticasone (FLONASE) 50 MCG/ACT nasal spray, Place 1 spray into both nostrils daily as needed., Disp: , Rfl:  .  furosemide (LASIX) 20 MG tablet, Take 1 tablet (20 mg total) by mouth daily as needed., Disp: 90 tablet, Rfl: 3 .  Multiple Vitamin (MULTIVITAMIN WITH MINERALS) TABS tablet, Take 1 tablet by mouth daily., Disp: 30 tablet, Rfl: 0 .  pantoprazole (PROTONIX) 20 MG tablet, Take 1 tablet (20 mg total) by mouth daily., Disp: 30 tablet, Rfl: 5 .  sacubitril-valsartan (ENTRESTO) 24-26 MG, Take 1 tablet by mouth 2 (two) times daily., Disp: 60 tablet, Rfl: 3 .  ticagrelor (BRILINTA) 90 MG TABS tablet, Take 1 tablet (90 mg total) by mouth 2 (two) times daily., Disp: 60 tablet, Rfl: 3  Past Medical History: Past Medical History:  Diagnosis Date  . CHF (congestive heart failure) (HCC)   . Coronary artery disease    occluded ostial LAD with otherwise no obstructive disease.   PCI/DES to ostial LAD  . HFrEF (heart failure with reduced ejection fraction) (HCC)    EF 30-35%  . History of cardiac arrest    VF arrest 3/14  . History of ST elevation myocardial infarction (STEMI)     09/07/20 anteriolateral STEMI  . History of tobacco use   . History of ventricular fibrillation    3/14  . Hyperlipidemia LDL goal <70     Tobacco Use: Social History   Tobacco Use  Smoking Status Former Smoker  . Quit date: 09/09/2020  . Years since quitting: 0.1  Smokeless Tobacco Never Used    Labs: Recent Hydrographic surveyor    Labs for ITP Cardiac and Pulmonary Rehab Latest Ref Rng & Units 09/07/2020 09/08/2020 09/09/2020 09/11/2020 09/16/2020   Cholestrol 0 - 200 mg/dL - - 784 - 696   LDLCALC 0 - 99 mg/dL - - 75 - 70   HDL >29 mg/dL - - 52(W) - 41(L)   Trlycerides <150 mg/dL - 244 86 010(U) 725   Hemoglobin A1c 4.8 - 5.6 % - 6.0(H) - - -   PHART 7.350 - 7.450 7.30(L) 7.33(L) - - -   PCO2ART 32.0 - 48.0 mmHg 49(H) 43 - - -   HCO3 20.0 - 28.0 mmol/L 24.1 22.7 - - -   ACIDBASEDEF 0.0 - 2.0 mmol/L 2.8(H) 3.2(H) - - -   O2SAT % 98.2 98.7 - 67.7 -       Exercise Target Goals: Exercise Program Goal: Individual exercise prescription set using results from initial 6 min walk test and THRR while considering  patient's activity barriers and safety.   Exercise Prescription Goal: Initial exercise prescription builds to 30-45 minutes a day of  aerobic activity, 2-3 days per week.  Home exercise guidelines will be given to patient during program as part of exercise prescription that the participant will acknowledge.   Education: Aerobic Exercise: - Group verbal and visual presentation on the components of exercise prescription. Introduces F.I.T.T principle from ACSM for exercise prescriptions.  Reviews F.I.T.T. principles of aerobic exercise including progression. Written material given at graduation. Flowsheet Row Cardiac Rehab from 11/04/2020 in [facility] Cardiac and Pulmonary Rehab  Date 10/14/20  Educator AS  Instruction Review Code 1- Verbalizes Understanding      Education: Resistance Exercise: - Group verbal and visual presentation on the components of exercise prescription.  Introduces F.I.T.T principle from ACSM for exercise prescriptions  Reviews F.I.T.T. principles of resistance exercise including progression. Written material given at graduation. Flowsheet Row Cardiac Rehab from 11/04/2020 in [facility] Cardiac and Pulmonary Rehab  Date 10/21/20  Educator AS  Instruction Review Code 1- Verbalizes Understanding       Education: Exercise & Equipment Safety: - Individual verbal instruction and demonstration of equipment use and safety with use of the equipment. Flowsheet Row Cardiac Rehab from 11/04/2020 in [facility] Cardiac and Pulmonary Rehab  Education need identified 10/08/20  Date 10/08/20  Educator KL  Instruction Review Code 1- Verbalizes Understanding      Education: Exercise Physiology & General Exercise Guidelines: - Group verbal and written instruction with models to review the exercise physiology of the cardiovascular system and associated critical values. Provides general exercise guidelines with specific guidelines to those with heart or lung disease.    Education: Flexibility, Balance, Mind/Body Relaxation: - Group verbal and visual presentation with interactive activity on the components of exercise prescription. Introduces F.I.T.T principle from ACSM for exercise prescriptions. Reviews F.I.T.T. principles of flexibility and balance exercise training including progression. Also discusses the mind body connection.  Reviews various relaxation techniques to help reduce and manage stress (i.e. Deep breathing, progressive muscle relaxation, and visualization). Balance handout provided to take home. Written material given at graduation. Flowsheet Row Cardiac Rehab from 11/04/2020 in [facility] Cardiac and Pulmonary Rehab  Date 10/28/20  Educator AS  Instruction Review Code 1- Verbalizes Understanding      Activity Barriers & Risk Stratification:  Activity Barriers & Cardiac Risk Stratification - 10/08/20 0932      Activity Barriers & Cardiac Risk Stratification    Activity Barriers Neck/Spine Problems;Back Problems;Other (comment)    Comments Rib pain from chest compressions    Cardiac Risk Stratification High           6 Minute Walk:  6 Minute Walk    Row Name 10/08/20 1419         6 Minute Walk   Phase Initial     Distance 1465 feet     Walk Time 6 minutes     # of Rest Breaks 0     MPH 2.77     METS 4.54     RPE 7     Perceived Dyspnea  1     VO2 Peak 15.91     Symptoms No     Resting HR 68 bpm     Resting BP 106/68     Resting Oxygen Saturation  96 %     Exercise Oxygen Saturation  during 6 min walk 97 %     Max Ex. HR 97 bpm     Max Ex. BP 116/68     2 Minute Post BP 108/70  Oxygen Initial Assessment:   Oxygen Re-Evaluation:   Oxygen Discharge (Final Oxygen Re-Evaluation):   Initial Exercise Prescription:  Initial Exercise Prescription - 10/08/20 1100      Date of Initial Exercise RX and Referring Provider   Date 10/08/20    Referring Provider Lorine Bears MD      Treadmill   MPH 2.8    Grade 2    Minutes 15    METs 3.9      NuStep   Level 4    SPM 80    Minutes 15    METs 4.5      Recumbant Elliptical   Level 3    RPM 50    Minutes 15    METs 4.5      REL-XR   Level 4    Speed 50    Minutes 15    METs 4.5      T5 Nustep   Level 3    SPM 80    Minutes 15    METs 4.5      Prescription Details   Frequency (times per week) 3    Duration Progress to 30 minutes of continuous aerobic without signs/symptoms of physical distress      Intensity   THRR 40-80% of Max Heartrate 111-155    Ratings of Perceived Exertion 11-13    Perceived Dyspnea 0-4      Progression   Progression Continue to progress workloads to maintain intensity without signs/symptoms of physical distress.      Resistance Training   Training Prescription Yes    Weight 5 lb    Reps 10-15           Perform Capillary Blood Glucose checks as needed.  Exercise Prescription Changes:  Exercise  Prescription Changes    Row Name 10/08/20 1100 10/15/20 1500 10/28/20 1300         Response to Exercise   Blood Pressure (Admit) 106/68 100/62 110/60     Blood Pressure (Exercise) 116/68 130/64 124/70     Blood Pressure (Exit) 108/70 98/64 94/62      Heart Rate (Admit) 68 bpm 67 bpm 77 bpm     Heart Rate (Exercise) 97 bpm 129 bpm 124 bpm     Heart Rate (Exit) 66 bpm 99 bpm 84 bpm     Oxygen Saturation (Admit) 96 % -- --     Oxygen Saturation (Exercise) 97 % -- --     Oxygen Saturation (Exit) 97 % -- --     Rating of Perceived Exertion (Exercise) 7 13 12      Perceived Dyspnea (Exercise) 1 -- --     Symptoms none none none     Comments walk test results 2nd full day exercise --           Resistance Training   Training Prescription -- Yes Yes     Weight -- 5 lb 5 lb     Reps -- 10-15 10-15           Treadmill   MPH -- 2.8 3     Grade -- 2 2     Minutes -- 15 15     METs -- 3.9 4.2           NuStep   Level -- -- 5     Minutes -- -- 15     METs -- -- 4.12           Recumbant Elliptical   Level -- 3 --  RPM -- 50 --     Minutes -- 15 --     METs -- 4.5 --           REL-XR   Level -- 5 --     Speed -- 50 --     Minutes -- 15 --     METs -- 3.9 --            Exercise Comments:   Exercise Goals and Review:  Exercise Goals    Row Name 10/08/20 1147             Exercise Goals   Increase Physical Activity Yes       Intervention Provide advice, education, support and counseling about physical activity/exercise needs.;Develop an individualized exercise prescription for aerobic and resistive training based on initial evaluation findings, risk stratification, comorbidities and participant's personal goals.       Expected Outcomes Short Term: Attend rehab on a regular basis to increase amount of physical activity.;Long Term: Add in home exercise to make exercise part of routine and to increase amount of physical activity.;Long Term: Exercising regularly at  least 3-5 days a week.       Increase Strength and Stamina Yes       Intervention Provide advice, education, support and counseling about physical activity/exercise needs.;Develop an individualized exercise prescription for aerobic and resistive training based on initial evaluation findings, risk stratification, comorbidities and participant's personal goals.       Expected Outcomes Short Term: Increase workloads from initial exercise prescription for resistance, speed, and METs.;Short Term: Perform resistance training exercises routinely during rehab and add in resistance training at home;Long Term: Improve cardiorespiratory fitness, muscular endurance and strength as measured by increased METs and functional capacity ( )       Able to understand and use rate of perceived exertion (RPE) scale Yes       Intervention Provide education and explanation on how to use RPE scale       Expected Outcomes Short Term: Able to use RPE daily in rehab to express subjective intensity level;Long Term:  Able to use RPE to guide intensity level when exercising independently       Able to understand and use Dyspnea scale Yes       Intervention Provide education and explanation on how to use Dyspnea scale       Expected Outcomes Short Term: Able to use Dyspnea scale daily in rehab to express subjective sense of shortness of breath during exertion;Long Term: Able to use Dyspnea scale to guide intensity level when exercising independently       Knowledge and understanding of Target Heart Rate Range (THRR) Yes       Intervention Provide education and explanation of THRR including how the numbers were predicted and where they are located for reference       Expected Outcomes Short Term: Able to state/look up THRR;Short Term: Able to use daily as guideline for intensity in rehab;Long Term: Able to use THRR to govern intensity when exercising independently       Able to check pulse independently Yes       Intervention  Provide education and demonstration on how to check pulse in carotid and radial arteries.;Review the importance of being able to check your own pulse for safety during independent exercise       Expected Outcomes Short Term: Able to explain why pulse checking is important during independent exercise;Long Term: Able to check pulse independently and accurately  Understanding of Exercise Prescription Yes       Intervention Provide education, explanation, and written materials on patient's individual exercise prescription       Expected Outcomes Short Term: Able to explain program exercise prescription;Long Term: Able to explain home exercise prescription to exercise independently              Exercise Goals Re-Evaluation :  Exercise Goals Re-Evaluation    Row Name 10/12/20 1726 10/15/20 1551 10/26/20 1741         Exercise Goal Re-Evaluation   Exercise Goals Review Able to understand and use rate of perceived exertion (RPE) scale;Increase Physical Activity;Knowledge and understanding of Target Heart Rate Range (THRR);Understanding of Exercise Prescription;Increase Strength and Stamina;Able to check pulse independently Able to understand and use rate of perceived exertion (RPE) scale;Increase Physical Activity;Knowledge and understanding of Target Heart Rate Range (THRR);Understanding of Exercise Prescription;Increase Strength and Stamina;Able to check pulse independently Able to understand and use rate of perceived exertion (RPE) scale;Increase Physical Activity;Knowledge and understanding of Target Heart Rate Range (THRR);Understanding of Exercise Prescription;Increase Strength and Stamina;Able to check pulse independently     Comments Reviewed RPE and dyspnea scales, THR and program prescription with pt today.  Pt voiced understanding and was given a copy of goals to take home. Adam Carter is doing well for his first couple of exercise sessions at rehab. He is starting to get to know the routine a  little bit better. Will continue to monitor. Reviewed home exercise with pt today.  Pt plans to walk for exercise.  Reviewed THR, pulse, RPE, sign and symptoms, pulse oximetery and when to call 911 or MD.  Also discussed weather considerations and indoor options.  Pt voiced understanding.     Expected Outcomes Short: Use RPE daily to regulate intensity. Long: Follow program prescription in THR. Short: Continue attendance with rehab Long: Exercise at home independently with appropriate exercise prescription Short: Add on 1 day of exercise at home, then add on 2nd day after Long: Exercise at home with confidence at appropriate measures            Discharge Exercise Prescription (Final Exercise Prescription Changes):  Exercise Prescription Changes - 10/28/20 1300      Response to Exercise   Blood Pressure (Admit) 110/60    Blood Pressure (Exercise) 124/70    Blood Pressure (Exit) 94/62    Heart Rate (Admit) 77 bpm    Heart Rate (Exercise) 124 bpm    Heart Rate (Exit) 84 bpm    Rating of Perceived Exertion (Exercise) 12    Symptoms none      Resistance Training   Training Prescription Yes    Weight 5 lb    Reps 10-15      Treadmill   MPH 3    Grade 2    Minutes 15    METs 4.2      NuStep   Level 5    Minutes 15    METs 4.12           Nutrition:  Target Goals: Understanding of nutrition guidelines, daily intake of sodium 1500mg , cholesterol 200mg , calories 30% from fat and 7% or less from saturated fats, daily to have 5 or more servings of fruits and vegetables.  Education: All About Nutrition: -Group instruction provided by verbal, written material, interactive activities, discussions, models, and posters to present general guidelines for heart healthy nutrition including fat, fiber, MyPlate, the role of sodium in heart healthy nutrition, utilization of the nutrition label,  and utilization of this knowledge for meal planning. Follow up email sent as well. Written material  given at graduation. Flowsheet Row Cardiac Rehab from 11/04/2020 in [facility] Cardiac and Pulmonary Rehab  Date 11/04/20  Educator [facility]  Instruction Review Code 1- Verbalizes Understanding      Biometrics:  Pre Biometrics - 10/08/20 0932      Pre Biometrics   Height 5' 7.75" (1.721 m)    Weight 178 lb (80.7 kg)    BMI (Calculated) 27.26    Single Leg Stand 30 seconds            Nutrition Therapy Plan and Nutrition Goals:  Nutrition Therapy & Goals - 10/28/20 1614      Nutrition Therapy   Diet Heart healthy, Low Na    Drug/Food Interactions Statins/Certain Fruits    Protein (specify units) 60g    Fiber 30 grams    Whole Grain Foods 3 servings    Saturated Fats 12 max. grams    Fruits and Vegetables 8 servings/day    Sodium 1.5 grams      Personal Nutrition Goals   Nutrition Goal ST: increase variety of vegetables (all colors in a week at least), try to prep more in advance like bean salad  LT: get a larger variety of plant foods, meet fiber needs 30g    Comments Does not have sweets. Will have some cheerios as a snack. Will have water and small pepsi B: eggs (2) - 2 slices of bread (white wheat - b/c of girlfriends or whole grain) L: PB and J sandwich D: salmon, tuna steak, with vegetables  (mixes it up). Will sometimes have hamburger (lean), chili beans, [place]. Discussed heart healthy eaitng and planning meals - he feels that it can get hectic with cooking recipes every night.      Intervention Plan   Intervention Prescribe, educate and counsel regarding individualized specific dietary modifications aiming towards targeted core components such as weight, hypertension, lipid management, diabetes, heart failure and other comorbidities.;Nutrition handout(s) given to patient.    Expected Outcomes Short Term Goal: Understand basic principles of dietary content, such as calories, fat, sodium, cholesterol and nutrients.;Short Term Goal: A plan has been developed with personal nutrition  goals set during dietitian appointment.;Long Term Goal: Adherence to prescribed nutrition plan.           Nutrition Assessments:  MEDIFICTS Score Key:  ?70 Need to make dietary changes   40-70 Heart Healthy Diet  ? 40 Therapeutic Level Cholesterol Diet  Flowsheet Row Cardiac Rehab from 10/08/2020 in [facility] Cardiac and Pulmonary Rehab  Picture Your Plate Total Score on Admission May 19, 1976     Picture Your Plate Scores:  <16 Unhealthy dietary pattern with much room for improvement.  41-50 Dietary pattern unlikely to meet recommendations for good health and room for improvement.  51-60 More healthful dietary pattern, with some room for improvement.   >60 Healthy dietary pattern, although there may be some specific behaviors that could be improved.    Nutrition Goals Re-Evaluation:   Nutrition Goals Discharge (Final Nutrition Goals Re-Evaluation):   Psychosocial: Target Goals: Acknowledge presence or absence of significant depression and/or stress, maximize coping skills, provide positive support system. Participant is able to verbalize types and ability to use techniques and skills needed for reducing stress and depression.   Education: Stress, Anxiety, and Depression - Group verbal and visual presentation to define topics covered.  Reviews how body is impacted by stress, anxiety, and depression.  Also discusses healthy  ways to reduce stress and to treat/manage anxiety and depression.  Written material given at graduation.   Education: Sleep Hygiene -Provides group verbal and written instruction about how sleep can affect your health.  Define sleep hygiene, discuss sleep cycles and impact of sleep habits. Review good sleep hygiene tips.    Initial Review & Psychosocial Screening:  Initial Psych Review & Screening - 10/02/20 1416      Initial Review   Current issues with Current Stress Concerns    Source of Stress Concerns Unable to perform yard/household activities;Unable to  participate in former interests or hobbies      Family Dynamics   Good Support System? Yes   girlfriend     Barriers   Psychosocial barriers to participate in program There are no identifiable barriers or psychosocial needs.      Screening Interventions   Interventions Encouraged to exercise;Provide feedback about the scores to participant;To provide support and resources with identified psychosocial needs    Expected Outcomes Short Term goal: Utilizing psychosocial counselor, staff and physician to assist with identification of specific Stressors or current issues interfering with healing process. Setting desired goal for each stressor or current issue identified.;Long Term Goal: Stressors or current issues are controlled or eliminated.;Short Term goal: Identification and review with participant of any Quality of Life or Depression concerns found by scoring the questionnaire.;Long Term goal: The participant improves quality of Life and PHQ9 Scores as seen by post scores and/or verbalization of changes           Quality of Life Scores:   Quality of Life - 10/08/20 1152      Quality of Life   Select Quality of Life      Quality of Life Scores   Health/Function Pre 20.5 %    Socioeconomic Pre 21.36 %    Psych/Spiritual Pre 22.93 %    Family Pre 22.13 %    GLOBAL Pre 21.42 %          Scores of 19 and below usually indicate a poorer quality of life in these areas.  A difference of  2-3 points is a clinically meaningful difference.  A difference of 2-3 points in the total score of the Quality of Life Index has been associated with significant improvement in overall quality of life, self-image, physical symptoms, and general health in studies assessing change in quality of life.  PHQ-9: Recent Review Flowsheet Data    Depression screen [facility] 2/9 10/08/2020   Decreased Interest 0   Down, Depressed, Hopeless 1   PHQ - 2 Score 1   Altered sleeping 0   Tired, decreased energy 1   Change  in appetite 0   Feeling bad or failure about yourself  0   Trouble concentrating 0   Moving slowly or fidgety/restless 1   Suicidal thoughts 0   PHQ-9 Score 3   Difficult doing work/chores Somewhat difficult     Interpretation of Total Score  Total Score Depression Severity:  1-4 = Minimal depression, 5-9 = Mild depression, 10-14 = Moderate depression, 15-19 = Moderately severe depression, 20-27 = Severe depression   Psychosocial Evaluation and Intervention:  Psychosocial Evaluation - 10/02/20 1421      Psychosocial Evaluation & Interventions   Interventions Encouraged to exercise with the program and follow exercise prescription    Comments Adam Carter states he is 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 He is on short term disability from work and doesn't know when he will  return. His girlfriend has been very supportive through the healing process. All these medicines and doctor appointments are new to him so he is looking forward to feeling more confident in managing his healthcare. He was discharged with a LifeVest and hopes to have it removed in time. He is sleeping okay, but sometimes wakes up because of the rib pain post vfib arrest. He did quit smoking when he was admitted in the hospital and hasn't had many cravings to restart.    Expected Outcomes Short: attend cardiac rehab for education and exercise. Long: develop positive self care habits.    Continue Psychosocial Services  Follow up required by staff           Psychosocial Re-Evaluation:  Psychosocial Re-Evaluation    Row Name 10/26/20 1753             Psychosocial Re-Evaluation   Current issues with Current Anxiety/Panic       Comments Adam Carter is doing well. Thie biggest problem he has is panic attacks about his heart. His doctor told him today that he is able to take off his life vest, though he said he is anxious as he feels it is his "security blanket." He has low confidence with his ability to exercise and he  plans to work on this. He is also anxious as he has not touched a cigarette since his heart event, we talked about taking walks or distracting himself to reduce the urge. We also talked about using [email or web address] to help with guided meditation or deep breathing which he is interested in trying. His doctor offered oral medication to take, though has declined to use it.       Expected Outcomes Short: Try [email or web address] for meditation/deep breathing Long: Continue to maintain positive attitude       Interventions Encouraged to attend Cardiac Rehabilitation for the exercise       Continue Psychosocial Services  Follow up required by staff              Psychosocial Discharge (Final Psychosocial Re-Evaluation):  Psychosocial Re-Evaluation - 10/26/20 1753      Psychosocial Re-Evaluation   Current issues with Current Anxiety/Panic    Comments Adam Carter is doing well. Thie biggest problem he has is panic attacks about his heart. His doctor told him today that he is able to take off his life vest, though he said he is anxious as he feels it is his "security blanket." He has low confidence with his ability to exercise and he plans to work on this. He is also anxious as he has not touched a cigarette since his heart event, we talked about taking walks or distracting himself to reduce the urge. We also talked about using calm.com to help with guided meditation or deep breathing which he is interested in trying. His doctor offered oral medication to take, though has declined to use it.    Expected Outcomes Short: Try [email or web address] for meditation/deep breathing Long: Continue to maintain positive attitude    Interventions Encouraged to attend Cardiac Rehabilitation for the exercise    Continue Psychosocial Services  Follow up required by staff           Vocational Rehabilitation: Provide vocational rehab assistance to qualifying candidates.   Vocational Rehab Evaluation & Intervention:  Vocational Rehab - 10/02/20 1416       Initial Vocational Rehab Evaluation & Intervention   Assessment shows need for Vocational Rehabilitation No  Education: Education Goals: Education classes will be provided on a variety of topics geared toward better understanding of heart health and risk factor modification. Participant will state understanding/return demonstration of topics presented as noted by education test scores.  Learning Barriers/Preferences:  Learning Barriers/Preferences - 10/02/20 1416      Learning Barriers/Preferences   Learning Barriers None    Learning Preferences None           General Cardiac Education Topics:  AED/CPR: - Group verbal and written instruction with the use of models to demonstrate the basic use of the AED with the basic ABC's of resuscitation.   Anatomy and Cardiac Procedures: - Group verbal and visual presentation and models provide information about basic cardiac anatomy and function. Reviews the testing methods done to diagnose heart disease and the outcomes of the test results. Describes the treatment choices: Medical Management, Angioplasty, or Coronary Bypass Surgery for treating various heart conditions including Myocardial Infarction, Angina, Valve Disease, and Cardiac Arrhythmias.  Written material given at graduation. Flowsheet Row Cardiac Rehab from 11/04/2020 in [facility] Cardiac and Pulmonary Rehab  Date 10/21/20  Educator [facility]  Instruction Review Code 1- Verbalizes Understanding      Medication Safety: - Group verbal and visual instruction to review commonly prescribed medications for heart and lung disease. Reviews the medication, class of the drug, and side effects. Includes the steps to properly store meds and maintain the prescription regimen.  Written material given at graduation.   Intimacy: - Group verbal instruction through game format to discuss how heart and lung disease can affect sexual intimacy. Written material given at graduation.. Flowsheet  Row Cardiac Rehab from 11/04/2020 in [facility] Cardiac and Pulmonary Rehab  Date 10/14/20  Educator AS  Instruction Review Code 1- Verbalizes Understanding      Know Your Numbers and Heart Failure: - Group verbal and visual instruction to discuss disease risk factors for cardiac and pulmonary disease and treatment options.  Reviews associated critical values for Overweight/Obesity, Hypertension, Cholesterol, and Diabetes.  Discusses basics of heart failure: signs/symptoms and treatments.  Introduces Heart Failure Zone chart for action plan for heart failure.  Written material given at graduation.   Infection Prevention: - Provides verbal and written material to individual with discussion of infection control including proper hand washing and proper equipment cleaning during exercise session. Flowsheet Row Cardiac Rehab from 11/04/2020 in [facility] Cardiac and Pulmonary Rehab  Education need identified 10/08/20  Date 10/08/20  Educator [place]  Instruction Review Code 1- Verbalizes Understanding      Falls Prevention: - Provides verbal and written material to individual with discussion of falls prevention and safety. Flowsheet Row Cardiac Rehab from 11/04/2020 in [facility] Cardiac and Pulmonary Rehab  Education need identified 10/08/20  Date 10/08/20  Educator KL  Instruction Review Code 1- Verbalizes Understanding      Other: -Provides group and verbal instruction on various topics (see comments)   Knowledge Questionnaire Score:  Knowledge Questionnaire Score - 10/08/20 9233      Knowledge Questionnaire Score   Pre Score 23/26: Heart Failure, Exercise, Nutrition           Core Components/Risk Factors/Patient Goals at Admission:  Personal Goals and Risk Factors at Admission - 10/08/20 1148      Core Components/Risk Factors/Patient Goals on Admission    Weight Management Yes;Weight Loss    Intervention Weight Management: Develop a combined nutrition and exercise program designed to reach  desired caloric intake, while maintaining appropriate intake of nutrient and fiber, sodium  and fats, and appropriate energy expenditure required for the weight goal.;Weight Management: Provide education and appropriate resources to help participant work on and attain dietary goals.;Weight Management/Obesity: Establish reasonable short term and long term weight goals.    Admit Weight 178 lb (80.7 kg)    Goal Weight: Short Term 174 lb (78.9 kg)    Goal Weight: Long Term 168 lb (76.2 kg)    Expected Outcomes Short Term: Continue to assess and modify interventions until short term weight is achieved;Long Term: Adherence to nutrition and physical activity/exercise program aimed toward attainment of established weight goal;Weight Loss: Understanding of general recommendations for a balanced deficit meal plan, which promotes 1-2 lb weight loss per week and includes a negative energy balance of [phone] kcal/d;Understanding recommendations for meals to include 15-35% energy as protein, 25-35% energy from fat, 35-60% energy from carbohydrates, less than 200mg  of dietary cholesterol, 20-35 gm of total fiber daily;Understanding of distribution of calorie intake throughout the day with the consumption of 4-5 meals/snacks    Tobacco Cessation Yes   Quit 09/07/20   Intervention Assist the participant in steps to quit. Provide individualized education and counseling about committing to Tobacco Cessation, relapse prevention, and pharmacological support that can be provided by physician.;Education officer, environmentalffer self-teaching materials, assist with locating and accessing local/national Quit Smoking programs, and support quit date choice.    Expected Outcomes Short Term: Will demonstrate readiness to quit, by selecting a quit date.;Short Term: Will quit all tobacco product use, adhering to prevention of relapse plan.;Long Term: Complete abstinence from all tobacco products for at least 12 months from quit date.    Heart Failure Yes     Intervention Provide a combined exercise and nutrition program that is supplemented with education, support and counseling about heart failure. Directed toward relieving symptoms such as shortness of breath, decreased exercise tolerance, and extremity edema.    Expected Outcomes Improve functional capacity of life;Short term: Attendance in program 2-3 days a week with increased exercise capacity. Reported lower sodium intake. Reported increased fruit and vegetable intake. Reports medication compliance.;Short term: Daily weights obtained and reported for increase. Utilizing diuretic protocols set by physician.;Long term: Adoption of self-care skills and reduction of barriers for early signs and symptoms recognition and intervention leading to self-care maintenance.    Lipids Yes    Intervention Provide education and support for participant on nutrition & aerobic/resistive exercise along with prescribed medications to achieve LDL 70mg , HDL >40mg .    Expected Outcomes Short Term: Participant states understanding of desired cholesterol values and is compliant with medications prescribed. Participant is following exercise prescription and nutrition guidelines.;Long Term: Cholesterol controlled with medications as prescribed, with individualized exercise RX and with personalized nutrition plan. Value goals: LDL < 70mg , HDL > 40 mg.           Education:Diabetes - Individual verbal and written instruction to review signs/symptoms of diabetes, desired ranges of glucose level fasting, after meals and with exercise. Acknowledge that pre and post exercise glucose checks will be done for 3 sessions at entry of program.   Core Components/Risk Factors/Patient Goals Review:   Goals and Risk Factor Review    Row Name 10/26/20 1745             Core Components/Risk Factors/Patient Goals Review   Personal Goals Review Tobacco Cessation;Weight Management/Obesity;Heart Failure;Hypertension       Review Adam Carter is  doing well. Ejection has gone up 10% and his doctor told him today he is able to take off his life  vest! He has anxiety taking off his left vest. He denies any symptoms with heart failure. Weight has been pretty stable, denies any significant weight gain. BP have been great, sometimes he runs low, which he does not get symptomatic with. He is aware to stay hydrated. He has continued to stop smoking and applauded for his efforts. It is hard for him and he get eager to pick up a cigarette sometimes, but has a plan to refer to and takes walks when he feels the urge.       Expected Outcomes Long: Continue smoking cessation Long: Continue to manage lifestyle risk factors              Core Components/Risk Factors/Patient Goals at Discharge (Final Review):   Goals and Risk Factor Review - 10/26/20 1745      Core Components/Risk Factors/Patient Goals Review   Personal Goals Review Tobacco Cessation;Weight Management/Obesity;Heart Failure;Hypertension    Review Adam Carter is doing well. Ejection has gone up 10% and his doctor told him today he is able to take off his life vest! He has anxiety taking off his left vest. He denies any symptoms with heart failure. Weight has been pretty stable, denies any significant weight gain. BP have been great, sometimes he runs low, which he does not get symptomatic with. He is aware to stay hydrated. He has continued to stop smoking and applauded for his efforts. It is hard for him and he get eager to pick up a cigarette sometimes, but has a plan to refer to and takes walks when he feels the urge.    Expected Outcomes Long: Continue smoking cessation Long: Continue to manage lifestyle risk factors           ITP Comments:  ITP Comments    Row Name 10/02/20 1424 10/08/20 0940 10/08/20 0941 10/12/20 1725 10/14/20 0945   ITP Comments Initial telephone orientation completed. Diagnosis can be found in [facility] 3/14. EP orientation scheduled for 4/14 at 8am. Adam Carter has recently quit  tobacco use within the last 6 months. Quit date was 09/07/20 after his heart event. Intervention for relapse prevention was provided at the initial medical review. He was encouraged to continue to with tobacco cessation and was provided information on relapse prevention. Patient received information about combination therapy, tobacco cessation classes, quit line, and quit smoking apps in case of a relapse. Patient demonstrated understanding of this material.Staff will continue to provide encouragement and follow up with the patient throughout the program. Completed and gym orientation. Initial ITP created and sent for review to Dr. Bethann Punches, Medical Director. First full day of exercise!  Patient was oriented to gym and equipment including functions, settings, policies, and procedures.  Patient's individual exercise prescription and treatment plan were reviewed.  All starting workloads were established based on the results of the 6 minute walk test done at initial orientation visit.  The plan for exercise progression was also introduced and progression will be customized based on patient's performance and goals. 30 Day review completed. Medical Director ITP review done, changes made as directed, and signed approval by Medical Director.   new to program   Row Name 11/11/20 0641           ITP Comments 30 Day review completed. Medical Director ITP review done, changes made as directed, and signed approval by Medical Director.              Comments:

## 2020-11-11 NOTE — Telephone Encounter (Signed)
Patient would like to discuss his medications, Losartan , not sure if he should be taking this. Also, Famotidine, not sure if he should take this one either. Please call to discuss.

## 2020-11-11 NOTE — Telephone Encounter (Signed)
Spoke to pt. Confirmed that he is NOT to be taking Losartan since he was transitioned to [place].  Pt confirmed that he has NOT been taking Losartan, but that his pharmacy prompted a refill request and he made sure it was cancelled.  Pt also asks if he is to continue Pantoprazole 20mg  daily. This was prescribed at [facility] 09/16/20. (Correction to message below, Famotidine was intended to say Pantoprazole.) Pt does not take any other PPI in addition to Pantoprazole.  He does ask why he is on this medication and if he is supposed to continue.  Will forward to provider and call pt back. Pt has no further questions at this time.

## 2020-11-13 NOTE — Telephone Encounter (Signed)
Patient payment has been completed and receipt mailed to patient

## 2020-11-16 MED ORDER — PANTOPRAZOLE SODIUM 20 MG PO TBEC: 20.0000 mg | DELAYED_RELEASE_TABLET | Freq: Every day | ORAL | 5 refills | Status: DC

## 2020-11-16 NOTE — Telephone Encounter (Signed)
Patient is calling back  °

## 2020-11-16 NOTE — Telephone Encounter (Signed)
Marisue Ivan D, PA-C  Annia Belt, RN; Cv Div [place] Triage 1 hour ago (10:33 AM)    PPI is to protect his stomach from antiplatelet therapy and reduce risk of bleeding, as well as due to his earlier report of GI sx that then resolved with the start of it.     Called the patient. lmtcb.

## 2020-11-16 NOTE — Telephone Encounter (Signed)
Patient made aware fo Jacquelyn's response and recommendation. Refill for protonix sent to the patients pharmacy.  Patient haas several questions regarding his disability status, continuing health benefits, and possible return to work.  Conversation lasting >58min. Patient concerns are related to his health benefit coverage. Pt sts that he may need to be transitioned to cobra coverage since he has been out of work 6 weeks. Adv him that I would not be able to answer those questions he would need to talk with his employer.  Patient sts that he has some additional questions regarding returning to work. He will await his 11/26/20 appt with Marisue Ivan, PA

## 2020-11-18 ENCOUNTER — Other Ambulatory Visit: Payer: Self-pay

## 2020-11-18 DIAGNOSIS — Z955 Presence of coronary angioplasty implant and graft: Secondary | ICD-10-CM

## 2020-11-18 DIAGNOSIS — I213 ST elevation (STEMI) myocardial infarction of unspecified site: Secondary | ICD-10-CM

## 2020-11-18 NOTE — Progress Notes (Signed)
Daily Session Note  Patient Details  Name: Adam Carter MRN: [id number] Date of Birth: 1975-08-30 Referring Provider:   Flowsheet Row Cardiac Rehab from 10/08/2020 in [facility] Cardiac and Pulmonary Rehab  Referring Provider Kathlyn Sacramento MD      Encounter Date: 11/18/2020  Check In:  Session Check In - 11/18/20 1717      Check-In   Supervising physician immediately available to respond to emergencies See telemetry face sheet for immediately available ER MD    Location [facility]-Cardiac & Pulmonary Rehab    Staff Present Birdie Sons, MPA, RN;Melissa Caiola RDN, LDN;Joseph Lou Miner, MS, ASCM CEP, Exercise Physiologist    Virtual Visit No    Medication changes reported     No    Fall or balance concerns reported    No    Warm-up and Cool-down Performed on first and last piece of equipment    Resistance Training Performed Yes    VAD Patient? No    PAD/SET Patient? No      Pain Assessment   Currently in Pain? No/denies              Social History   Tobacco Use  Smoking Status Former Smoker  . Quit date: 09/09/2020  . Years since quitting: 0.1  Smokeless Tobacco Never Used    Goals Met:  Independence with exercise equipment Exercise tolerated well No report of cardiac concerns or symptoms Strength training completed today  Goals Unmet:  Not Applicable  Comments: Pt able to follow exercise prescription today without complaint.  Will continue to monitor for progression.    Dr. Emily Filbert is Medical Director for [place].  Dr. Ottie Glazier is Medical Director for [facility] Pulmonary Rehabilitation.

## 2020-11-19 ENCOUNTER — Encounter: Payer: No Typology Code available for payment source | Admitting: *Deleted

## 2020-11-19 DIAGNOSIS — I213 ST elevation (STEMI) myocardial infarction of unspecified site: Secondary | ICD-10-CM | POA: Diagnosis not present

## 2020-11-19 DIAGNOSIS — Z955 Presence of coronary angioplasty implant and graft: Secondary | ICD-10-CM

## 2020-11-19 NOTE — Progress Notes (Signed)
Daily Session Note  Patient Details  Name: Adam Carter MRN: [id number] Date of Birth: Oct 10, 1975 Referring Provider:   Flowsheet Row Cardiac Rehab from 10/08/2020 in [facility] Cardiac and Pulmonary Rehab  Referring Provider Kathlyn Sacramento MD      Encounter Date: 11/19/2020  Check In:  Session Check In - 11/19/20 [place]      Check-In   Supervising physician immediately available to respond to emergencies See telemetry face sheet for immediately available ER MD    Location [facility]-Cardiac & Pulmonary Rehab    Staff Present Renita Papa, RN BSN;Melissa Caiola RDN, Rowe Pavy, BA, ACSM CEP, Exercise Physiologist;Kara Eliezer Bottom, MS, ASCM CEP, Exercise Physiologist    Virtual Visit No    Medication changes reported     No    Warm-up and Cool-down Performed on first and last piece of equipment    Resistance Training Performed Yes    VAD Patient? No    PAD/SET Patient? No      Pain Assessment   Currently in Pain? No/denies              Social History   Tobacco Use  Smoking Status Former Smoker  . Quit date: 09/09/2020  . Years since quitting: 0.1  Smokeless Tobacco Never Used    Goals Met:  Independence with exercise equipment Exercise tolerated well No report of cardiac concerns or symptoms Strength training completed today  Goals Unmet:  Not Applicable  Comments: Pt able to follow exercise prescription today without complaint.  Will continue to monitor for progression.    Dr. Emily Filbert is Medical Director for [place].  Dr. Ottie Glazier is Medical Director for [facility] Pulmonary Rehabilitation.

## 2020-11-25 ENCOUNTER — Other Ambulatory Visit: Payer: Self-pay

## 2020-11-25 ENCOUNTER — Encounter: Payer: No Typology Code available for payment source | Attending: Cardiovascular Disease | Admitting: *Deleted

## 2020-11-25 DIAGNOSIS — Z955 Presence of coronary angioplasty implant and graft: Secondary | ICD-10-CM | POA: Diagnosis present

## 2020-11-25 DIAGNOSIS — I213 ST elevation (STEMI) myocardial infarction of unspecified site: Secondary | ICD-10-CM | POA: Insufficient documentation

## 2020-11-25 NOTE — Progress Notes (Signed)
Daily Session Note  Patient Details  Name: Adam Carter MRN: [id number] Date of Birth: October 10, 1975 Referring Provider:   Flowsheet Row Cardiac Rehab from 10/08/2020 in [facility] Cardiac and Pulmonary Rehab  Referring Provider Kathlyn Sacramento MD      Encounter Date: 11/25/2020  Check In:  Session Check In - 11/25/20 1703      Check-In   Supervising physician immediately available to respond to emergencies See telemetry face sheet for immediately available ER MD    Location [facility]-Cardiac & Pulmonary Rehab    Staff Present Renita Papa, RN BSN;Melissa Caiola RDN, Rowe Pavy, BA, ACSM CEP, Exercise Physiologist;Kara Eliezer Bottom, MS, ASCM CEP, Exercise Physiologist    Virtual Visit No    Medication changes reported     No    Fall or balance concerns reported    No    Warm-up and Cool-down Performed on first and last piece of equipment    Resistance Training Performed Yes    VAD Patient? No    PAD/SET Patient? No      Pain Assessment   Currently in Pain? No/denies              Social History   Tobacco Use  Smoking Status Former Smoker  . Quit date: 09/09/2020  . Years since quitting: 0.2  Smokeless Tobacco Never Used    Goals Met:  Independence with exercise equipment Exercise tolerated well No report of cardiac concerns or symptoms Strength training completed today  Goals Unmet:  Not Applicable  Comments: Pt able to follow exercise prescription today without complaint.  Will continue to monitor for progression.    Dr. Emily Filbert is Medical Director for [facility].  Dr. Ottie Glazier is Medical Director for [facility] Pulmonary Rehabilitation.

## 2020-11-26 ENCOUNTER — Encounter: Payer: Self-pay | Admitting: Physician Assistant

## 2020-11-26 ENCOUNTER — Ambulatory Visit: Payer: No Typology Code available for payment source | Admitting: Physician Assistant

## 2020-11-26 ENCOUNTER — Other Ambulatory Visit: Payer: Self-pay

## 2020-11-26 VITALS — BP 100/64 | HR 68 | Ht 67.0 in | Wt 173.0 lb

## 2020-11-26 DIAGNOSIS — R7309 Other abnormal glucose: Secondary | ICD-10-CM

## 2020-11-26 DIAGNOSIS — Z87891 Personal history of nicotine dependence: Secondary | ICD-10-CM

## 2020-11-26 DIAGNOSIS — I5020 Unspecified systolic (congestive) heart failure: Secondary | ICD-10-CM | POA: Diagnosis not present

## 2020-11-26 DIAGNOSIS — Z79899 Other long term (current) drug therapy: Secondary | ICD-10-CM | POA: Diagnosis not present

## 2020-11-26 DIAGNOSIS — Z8679 Personal history of other diseases of the circulatory system: Secondary | ICD-10-CM

## 2020-11-26 DIAGNOSIS — E785 Hyperlipidemia, unspecified: Secondary | ICD-10-CM | POA: Diagnosis not present

## 2020-11-26 DIAGNOSIS — I252 Old myocardial infarction: Secondary | ICD-10-CM

## 2020-11-26 DIAGNOSIS — I2510 Atherosclerotic heart disease of native coronary artery without angina pectoris: Secondary | ICD-10-CM | POA: Diagnosis not present

## 2020-11-26 NOTE — Patient Instructions (Addendum)
Medication Instructions:   Your physician recommends that you continue on your current medications as directed. Please refer to the Current Medication list given to you today.  *If you need a refill on your cardiac medications before your next appointment, please call your pharmacy*   Lab Work:  Your physician recommends that you return for FASTING lab work to the [facility] when you are able: CMET, Lipid panel   -  Please go to the [facility]. You will check in at the front desk to the right as you walk into the atrium. Valet Parking is offered if needed. - No appointment needed. You may go any day between 7 am and 6 pm.   Testing/Procedures: None ordered   Follow-Up: At [facility], you and your health needs are our priority.  As part of our continuing mission to provide you with exceptional heart care, we have created designated Provider Care Teams.  These Care Teams include your primary Cardiologist (physician) and Advanced Practice Providers (APPs -  Physician Assistants and Nurse Practitioners) who all work together to provide you with the care you need, when you need it.  We recommend signing up for the patient portal called "MyChart".  Sign up information is provided on this After Visit Summary.  MyChart is used to connect with patients for Virtual Visits (Telemedicine).  Patients are able to view lab/test results, encounter notes, upcoming appointments, etc.  Non-urgent messages can be sent to your provider as well.   To learn more about what you can do with MyChart, go to [email or web address].    Your next appointment:   1 month(s)  The format for your next appointment:   In Person  Provider:   You may see Lorine Bears, MD or one of the following Advanced Practice Providers on your designated Care Team:     Marisue Ivan, [place]   Other Instructions  Primary Care Recommendations: Sammuel Cooper, NP Carmin Richmond, NP

## 2020-11-26 NOTE — Progress Notes (Addendum)
Office Visit    Patient Name: Adam Carter Date of Encounter: 11/29/2020  PCP:  System, Provider Not In   [facility]  Cardiologist:  Lorine Bears, MD  Advanced Practice Provider: Marisue Ivan, PA-C Electrophysiologist:  None :[id number]}   Chief Complaint    Chief Complaint  Patient presents with  . Follow-up    1 Month follow up and medications verbally reviewed with patient.    45 year old male with history of VF arrest / cardiac arrest and anterior lateral ST elevation myocardial infarction s/p PCI/DES to occluded ostial LAD 09/07/2020 with clinical course been complicated by aspiration pneumonia, acute on chronic HFrEF/ICM with EF 25 to 35% by LV gram and 30-35% by echo, tobacco use, and seen today for 1 month follow-up.  Past Medical History    Past Medical History:  Diagnosis Date  . CHF (congestive heart failure) (HCC)   . Coronary artery disease    occluded ostial LAD with otherwise no obstructive disease.   PCI/DES to ostial LAD  . HFrEF (heart failure with reduced ejection fraction) (HCC)    EF 30-35%  . History of cardiac arrest    VF arrest 3/14  . History of ST elevation myocardial infarction (STEMI)    09/07/20 anteriolateral STEMI  . History of tobacco use   . History of ventricular fibrillation    3/14  . Hyperlipidemia LDL goal <70    Past Surgical History:  Procedure Laterality Date  . CARDIAC CATHETERIZATION    . CORONARY/GRAFT ACUTE MI REVASCULARIZATION N/A 09/07/2020   Procedure: Coronary/Graft Acute MI Revascularization;  Surgeon: Iran Ouch, MD;  Location: [facility] INVASIVE CV LAB;  Service: Cardiovascular;  Laterality: N/A;  . RIGHT/LEFT HEART CATH AND CORONARY ANGIOGRAPHY N/A 09/07/2020   Procedure: RIGHT/LEFT HEART CATH AND CORONARY ANGIOGRAPHY;  Surgeon: Iran Ouch, MD;  Location: [facility] INVASIVE CV LAB;  Service: Cardiovascular;  Laterality: N/A;    Allergies  Allergies  Allergen Reactions  .  Bacitracin-Neomycin-Polymyxin Hives  . Bupropion Nausea Only    Passed out    History of Present Illness    Adam Carter is a 45 y.o. male with PMH as above.  Family history includes uncle and father that may have had a heart attack. He had reportedly been dealing with cervical spine issues and had an injection done about a week ago.  He reported intermittent chest pain over the weekend but did not seek medical attention.  He went to work the day of his cardiac event and reported some mild chest discomfort initially, but then after lunch he reported severe chest pain followed by collapse.  Coworkers witnessed the collapse and did CPR.  When EMS arrived, he was in ventricular fibrillation.  He received 2 shocks and a single dose of epinephrine and had ROSC.  Post arrest EKG showed anterior ST elevation myocardial infarction.  STEMI code was activated from the field.  The patient was brought to the ED and his airway was secured with a ET tube.  He had an IV placed and was given heparin.  Emergent catheterization was performed.  Echo showed LVEF 30 to 35%, regional wall motion abnormalities of LV, mild LVH, moderate hypokinesis of the left ventricular, mid apical, anteroseptal wall, anterior wall, and anterior lateral wall.  LGS -5.3%.  Catheterization was performed that showed occluded ostial LAD with otherwise no obstructive disease.  Successful PCI/DES was performed to the ostial LAD.  High-sensitivity troponin peaked at 27,000.  He was extubated and  off of nor epi as of 3/14.  He was started on low-dose losartan 12.5 mg daily.  He was fitted with LifeVest.  Brilinta card given to patient.  Given his bradycardia, beta-blocker was not added.  He was started on oral Lasix 20 mg daily.  Due to aspiration pneumonia/leukocytosis/fever, he was on antibiotics.  When seen in clinic 08/19/20, he reported chest soreness, occasional dizziness, orthopnea, cough with clear phlegm, and a right foot blister. He was not  smoking (quit at discharge). 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  He was drinking 1 beer every 3 to 4 months. Given his low BP, losartan held while diuresis started for 3 days with lasix 20mg  daily. GDMT unable to be escalated due to BP.   Seen 09/23/20 and mainly noted fatigue. On exam, euvolemic. He was with his girlfriend, who reported he was not drinking much fluid. He reported ongoing soreness in his chest but improving. He reported positional CP each AM and CP elicited by coughing. He had cervical neck pain. Cough was improving. He reported dizziness with position changes. He had received his BP cuff to start monitoring at home. He hd not yet started cardiac rehab. At his girlfriend's house, his home wt has been 173-178lbs. He had scant blood in his tissue when blowing his nose or using the restroom but no other s/sx of bleeding. His foot blister was improving. He does not yet have a PCP with # provided. He continued to avoid smoking. He was eating heart healthy with lots of vegetables. He had not yet had his visit with the HF clinic. He continued to wear his LifeVest.   Echo performed with EF improved 40-45%. Lifevest has been removed.  Today, he returns to clinic and notes he is doing well since weaning off his LifeVest. He does report some anxiety with driving. He has still been going to cardiac rehab, which is going well and without any CP or SOB. His chest soreness has pretty much resolved, though he does notice pops at times with his sternum. He states at times it feels "weird and pops." He is only using his lasix on a PRN basis and has not needed it lately. He has overhauled his diet and is avoiding salt, though does report eating high salt items during his birthday. He otherwise reports a very heart healthy diet. He is still not smoking with congratulations provided.   Home Medications    Current Outpatient  Medications on File Prior to Visit  Medication Sig Dispense Refill  . aspirin 81 MG chewable tablet Chew 1 tablet (81 mg total) by mouth daily. 30 tablet 2  . atorvastatin (LIPITOR) 80 MG tablet Take 1 tablet (80 mg total) by mouth daily at 6 PM. 30 tablet 3  . furosemide (LASIX) 20 MG tablet Take 1 tablet (20 mg total) by mouth daily as needed. 90 tablet 3  . Multiple Vitamin (MULTIVITAMIN WITH MINERALS) TABS tablet Take 1 tablet by mouth daily. 30 tablet 0  . pantoprazole (PROTONIX) 20 MG tablet Take 1 tablet (20 mg total) by mouth daily. 30 tablet 5  . sacubitril-valsartan (ENTRESTO) 24-26 MG Take 1 tablet by mouth 2 (two) times daily. 60 tablet 3  . ticagrelor (BRILINTA) 90 MG TABS tablet Take 1 tablet (90 mg total) by mouth 2 (two) times daily. 60 tablet 3   No current facility-administered medications on file prior to visit.  Review of Systems    He denies chest pain like before admission and reports resolution of chest soreness. At times, his sternum feels "weird" with popping. No palpitations, dyspnea, pnd, n, v, syncope, edema, weight gain, or early satiety.  His orthopnea is now resolved.  No s/sx of bleeding.All other systems reviewed and are otherwise negative except as noted above.  Physical Exam    VS:  BP 100/64 (BP Location: Left Arm, Patient Position: Sitting, Cuff Size: Normal)   Pulse 68   Ht 5\' 7"  (1.702 m)   Wt 173 lb (78.5 kg)   SpO2 98%   BMI 27.10 kg/m  , BMI Body mass index is 27.1 kg/m. GEN: Well nourished, well developed, in no acute distress.  HEENT: normal. Neck: Supple, no JVD, carotid bruits, or masses.  Cardiac:RRR, no murmurs, rubs, or gallops. No clubbing, cyanosis. No edema.  Radials/DP/PT 2+ and equal bilaterally.   Respiratory:  Respirations regular and unlabored, CTAB. GI: Soft, nontender, nondistended, BS + x 4. MS: no deformity or atrophy. Neuro:  Strength and sensation are intact. Psych: Normal affect.  Accessory Clinical Findings     ECG personally reviewed by me today -no ekg  VITALS Reviewed today   Temp Readings from Last 3 Encounters:  09/13/20 99.1 F (37.3 C) (Oral)   BP Readings from Last 3 Encounters:  11/26/20 100/64  10/26/20 118/78  10/05/20 107/82   Pulse Readings from Last 3 Encounters:  11/26/20 68  10/26/20 76  10/05/20 74    Wt Readings from Last 3 Encounters:  11/26/20 173 lb (78.5 kg)  10/26/20 173 lb (78.5 kg)  10/08/20 178 lb (80.7 kg)     LABS  reviewed today   Lab Results  Component Value Date   WBC 12.1 (H) 09/16/2020   HGB 13.6 09/16/2020   HCT 40.7 09/16/2020   MCV 92.5 09/16/2020   PLT 393 09/16/2020   Lab Results  Component Value Date   CREATININE 0.88 10/26/2020   BUN 13 10/26/2020   NA 138 10/26/2020   K 3.7 10/26/2020   CL 107 10/26/2020   CO2 22 10/26/2020   Lab Results  Component Value Date   ALT 33 09/16/2020   AST 24 09/16/2020   ALKPHOS 87 09/16/2020   BILITOT 1.3 (H) 09/16/2020   Lab Results  Component Value Date   CHOL 117 09/16/2020   HDL 26 (L) 09/16/2020   LDLCALC 70 09/16/2020   TRIG 106 09/16/2020   CHOLHDL 4.5 09/16/2020    Lab Results  Component Value Date   HGBA1C 6.0 (H) 09/08/2020   No results found for: TSH   STUDIES/PROCEDURES reviewed today   STOP-BANG Rex assessment  S-snore  Have you been told that you snore?   1  T-tired Are you often tired, fatigued, sleepy during the day?   1  O-obstruction Do you stop breathing, choke, or gasp during sleep?   0  P-pressure Do you have or are you being treated for high blood pressure?  0  B- BMI Is your BMI greater than 35 kg/m?  0  A- age Are you 50 years or older?  0  N-neck Do you have a neck circumference greater than 16 inches?  0  G-gender Are you male?   1  Total  3   Risk Score <3 Low risk of OSA ?3 High risk of OSA  Echo 10/23/20 1. Left ventricular ejection fraction, by estimation, is 40 to 45%. The  left ventricle has mildly decreased  function. The left  ventricle  demonstrates regional wall motion abnormalities (hypokinesis of the mid to  apical anterior and anteroseptal wall).  Left ventricular diastolic parameters are consistent with Grade II  diastolic dysfunction (pseudonormalization).  2. Right ventricular systolic function is normal. The right ventricular  size is normal.  3. The mitral valve is normal in structure. No evidence of mitral valve  regurgitation. No evidence of mitral stenosis.  Comparison(s): EF 30-35%, moderate hypokinesis LV, mid-apical,  anteroseptal wall, anterior wall, anterolateral wall.  GLS -5.3%.   Right and left heart catheterization 09/07/2020  The left ventricular ejection fraction is 25-35% by visual estimate.  There is moderate to severe left ventricular systolic dysfunction.  LV end diastolic pressure is moderately elevated.  Ost LAD to Prox LAD lesion is 100% stenosed.  Post intervention, there is a 0% residual stenosis.  A drug-eluting stent was successfully placed using a STENT RESOLUTE ONYX 3.5X12.  Mid LAD lesion is 30% stenosed. 1.  Out of hospital cardiac arrest with anterior ST elevation myocardial infarction due to ostial occlusion of the LAD.  No other obstructive disease. 2.  Moderately severely reduced LV systolic function with an EF of 25 to 30% with severe anterior/apical hypokinesis. 3.  Successful angioplasty and drug-eluting stent placement to the ostial LAD. 4.  Right heart catheterization was performed after PCI and showed mildly elevated filling pressures, mild pulmonary hypertension and normal cardiac output.  PA sat was 78.7%.  Fick cardiac output was 6.17 with a cardiac index of 3.5.  CPO was 1.4.  Based on these hemodynamic numbers, no hemodynamic support was utilized. Recommendations: Dual antiplatelet therapy for at least 1 year. High-dose atorvastatin. We will run Aggrastat infusion for 12 hours given uncertainty of oral absorption of antiplatelet medications. The  patient had significant agitation and this will need to be managed.  He is currently on propofol drip and fentanyl drip.  I discussed with Dr. Belia Heman. Recommend electrolyte replacement.  Critical care time of 45 minutes managing abnormal hemodynamics, ventilator management and electrolytes. There is a 0% residual stenosis post intervention.    Wall Motion  Resting                Left Heart  Left Ventricle The left ventricular size is normal. There is moderate to severe left ventricular systolic dysfunction. LV end diastolic pressure is moderately elevated. The left ventricular ejection fraction is 25-35% by visual estimate. There are LV function abnormalities due to segmental dysfunction.   Coronary Diagrams   Diagnostic Dominance: Right    Intervention       Echocardiogram 09/08/2020 1. Left ventricular ejection fraction, by estimation, is 30 to 35%. The  left ventricle has moderately decreased function. The left ventricle  demonstrates regional wall motion abnormalities (see scoring  diagram/findings for description). There is mild  left ventricular hypertrophy. Left ventricular diastolic parameters were  normal. There is moderate hypokinesis of the left ventricular, mid-apical  anteroseptal wall, anterior wall and anterolateral wall. The average left  ventricular global longitudinal  strain is -5.3 %. The global longitudinal strain is abnormal.  2. Right ventricular systolic function is normal. The right ventricular  size is normal. Tricuspid regurgitation signal is inadequate for assessing  PA pressure.  3. The mitral valve is normal in structure. No evidence of mitral valve  regurgitation. No evidence of mitral stenosis.  4. The aortic valve is normal in structure. Aortic valve regurgitation is  not visualized. No aortic stenosis is present.    Assessment &  Plan    Coronary artery disease History of anterior lateral ST elevation myocardial  infarction History of VF arrest/cardiac arrest --No chest pain or symptoms similar to that before his 3/14 presentation.   He underwent emergent LHC PCI/DES to the ostial LAD.  LV gram demonstrated EF 25 to 35% with 3/15 echo showing EF 30 to 35%. Most recent 4/29 echo improved above 35% at 40-45% with LV hypokinesis and G2DD.   Continue DAPT with ASA and Brilinta. Continue Protonix. No beta-blocker at this time. Tolerating low dose Entresto 24-26 BID and will complete medication assistance paperwork for this medication. Addition of BB at RTC should be considered, as well as future visits could consider SGLT2i and spironolactone (defer for now).  Work note provided today.  BMET today.  Chronic HFrEF (EF 30-35%) History of cardiogenic shock --Euvolemic today. Continue PRN dosing of lasix 20mg  daily. Escalation of GDMT at RTC as BP and heart rate allow. CHF education reviewed again today.   Hyperlipidemia, LDL goal less than 70 --Continue atorvastatin 80 mg daily.  LDL 75 during admission.  Repeat lipid and liver function today.  History of tobacco use --Ongoing cessation recommended. Congratulations provided for ongoing cessation.  Elevated A1C --3/15 A1c 6.0.  Reviewed diet and lifestyle. Recommend follow-up with PCP. Consider 4/15 / SGLT2i at [facility] / future visits.  Medication changes: None Labs ordered: BMET today.  lipid and liver Studies / Imaging ordered: None. Future considerations: Escalation of GDMT, WatchPat  RTC 1 mo  [facility], PA-C

## 2020-11-27 ENCOUNTER — Telehealth: Payer: Self-pay | Admitting: *Deleted

## 2020-11-27 NOTE — Telephone Encounter (Signed)
Faxed completed pt assistance application for Entresto to [employer] @ [phone]. Forms placed in designated file.

## 2020-11-30 ENCOUNTER — Other Ambulatory Visit
Admission: RE | Admit: 2020-11-30 | Discharge: 2020-11-30 | Disposition: A | Discharge status: Home / Self Care | Payer: No Typology Code available for payment source | Attending: Physician Assistant | Admitting: Physician Assistant

## 2020-11-30 ENCOUNTER — Other Ambulatory Visit: Payer: Self-pay

## 2020-11-30 ENCOUNTER — Encounter: Payer: No Typology Code available for payment source | Admitting: *Deleted

## 2020-11-30 DIAGNOSIS — E785 Hyperlipidemia, unspecified: Secondary | ICD-10-CM | POA: Diagnosis present

## 2020-11-30 DIAGNOSIS — Z79899 Other long term (current) drug therapy: Secondary | ICD-10-CM | POA: Diagnosis present

## 2020-11-30 DIAGNOSIS — I213 ST elevation (STEMI) myocardial infarction of unspecified site: Secondary | ICD-10-CM

## 2020-11-30 DIAGNOSIS — I2510 Atherosclerotic heart disease of native coronary artery without angina pectoris: Secondary | ICD-10-CM | POA: Diagnosis present

## 2020-11-30 DIAGNOSIS — Z955 Presence of coronary angioplasty implant and graft: Secondary | ICD-10-CM

## 2020-11-30 LAB — COMPREHENSIVE METABOLIC PANEL
ALT: 18 U/L (ref 0–44)
AST: 18 U/L (ref 15–41)
Albumin: 4 g/dL (ref 3.5–5.0)
Alkaline Phosphatase: 123 U/L (ref 38–126)
Anion gap: 9 (ref 5–15)
BUN: 7 mg/dL (ref 6–20)
CO2: 26 mmol/L (ref 22–32)
Calcium: 9.1 mg/dL (ref 8.9–10.3)
Chloride: 104 mmol/L (ref 98–111)
Creatinine, Ser: 0.88 mg/dL (ref 0.61–1.24)
GFR, Estimated: >60 mL/min (ref >60)
Glucose, Bld: 112 mg/dL — ABNORMAL HIGH (ref 70–99)
Potassium: 3.4 mmol/L — ABNORMAL LOW (ref 3.5–5.1)
Sodium: 139 mmol/L (ref 135–145)
Total Bilirubin: 1 mg/dL (ref 0.3–1.2)
Total Protein: 7.2 g/dL (ref 6.5–8.1)

## 2020-11-30 LAB — LIPID PANEL
Cholesterol: 87 mg/dL (ref 0–200)
HDL: 27 mg/dL — ABNORMAL LOW (ref >40)
LDL Cholesterol: 40 mg/dL (ref 0–99)
Total CHOL/HDL Ratio: 3.2 RATIO
Triglycerides: 99 mg/dL (ref <150)
VLDL: 20 mg/dL (ref 0–40)

## 2020-11-30 NOTE — Progress Notes (Signed)
Daily Session Note  Patient Details  Name: Adam Carter MRN: [id number] Date of Birth: 06-Nov-1975 Referring Provider:   Flowsheet Row Cardiac Rehab from 10/08/2020 in [facility] Cardiac and Pulmonary Rehab  Referring Provider Kathlyn Sacramento MD      Encounter Date: 11/30/2020  Check In:  Session Check In - 11/30/20 1759      Check-In   Supervising physician immediately available to respond to emergencies See telemetry face sheet for immediately available ER MD    Location [facility]-Cardiac & Pulmonary Rehab    Staff Present Heath Lark, RN, BSN, CCRP;Melissa [place] RDN, Wilhelmina Mcardle, BS, ACSM CEP, Exercise Physiologist;Amanda Oletta Darter, BA, ACSM CEP, Exercise Physiologist    Virtual Visit No    Medication changes reported     No    Fall or balance concerns reported    No    Warm-up and Cool-down Performed on first and last piece of equipment    Resistance Training Performed Yes    VAD Patient? No    PAD/SET Patient? No      Pain Assessment   Currently in Pain? No/denies              Social History   Tobacco Use  Smoking Status Former Smoker  . Quit date: 09/09/2020  . Years since quitting: 0.2  Smokeless Tobacco Never Used    Goals Met:  Independence with exercise equipment Exercise tolerated well No report of cardiac concerns or symptoms  Goals Unmet:  Not Applicable  Comments: Pt able to follow exercise prescription today without complaint.  Will continue to monitor for progression.    Dr. Emily Filbert is Medical Director for [place].  Dr. Ottie Glazier is Medical Director for [facility] Pulmonary Rehabilitation.

## 2020-11-30 NOTE — Telephone Encounter (Signed)
Per Marny Lowenstein at [employer] patient [employer] .   Prior auth needed to be sent to [employer] .  Call [phone].

## 2020-12-01 ENCOUNTER — Telehealth: Payer: Self-pay

## 2020-12-01 MED ORDER — POTASSIUM CHLORIDE ER 10 MEQ PO TBCR: EXTENDED_RELEASE_TABLET | ORAL | 3 refills | Status: DC

## 2020-12-01 NOTE — Telephone Encounter (Signed)
-----   Message from Lennon Alstrom, PA-C sent at 12/01/2020  7:45 AM EDT ----- Labs show --Cholesterol improved with LDL at goal --Potassium low.  Replete with KCL tab BID for 3 days. He does not regularly take lasix and only takes on a PRN basis. If he has been taking his lasix lately, he should start Kcl tab to take every time he takes his lasix. If not regularly taking his lasix, please ensure no EtOH use or GI distress.

## 2020-12-01 NOTE — Telephone Encounter (Signed)
Spoke with Adam Carter at [employer] who states she is going to initiate the PA and send a fax to our office with the information on it.

## 2020-12-01 NOTE — Telephone Encounter (Signed)
Patient made aware of lab results and Marisue Ivan, PA recommendation. Patient sts that he has not been taking lasix regularly. He denies alcohol use or any GI issues.  Rx for Potassium sent to the patients pharmacy. Potassium 10 meq bid x 3 days, then daily prn on the days he takes lasix.  Patient verbalized understanding to the instructions given and voiced appreciation for the call.

## 2020-12-02 ENCOUNTER — Telehealth: Payer: Self-pay

## 2020-12-02 ENCOUNTER — Other Ambulatory Visit: Payer: Self-pay

## 2020-12-02 DIAGNOSIS — I213 ST elevation (STEMI) myocardial infarction of unspecified site: Secondary | ICD-10-CM | POA: Diagnosis not present

## 2020-12-02 NOTE — Telephone Encounter (Signed)
PA started through [employer]  Key: [id number] PA Case ID: [id number] Rx #: [id number]  Awaiting determination.

## 2020-12-02 NOTE — Progress Notes (Signed)
Daily Session Note  Patient Details  Name: Lamoyne Hessel MRN: [id number] Date of Birth: 05-23-1976 Referring Provider:   Flowsheet Row Cardiac Rehab from 10/08/2020 in [facility] Cardiac and Pulmonary Rehab  Referring Provider Kathlyn Sacramento MD      Encounter Date: 12/02/2020  Check In:  Session Check In - 12/02/20 1709      Check-In   Supervising physician immediately available to respond to emergencies See telemetry face sheet for immediately available ER MD    Location [facility]-Cardiac & Pulmonary Rehab    Staff Present Birdie Sons, MPA, RN;Joseph Lou Miner, MS, ASCM CEP, Exercise Physiologist;Melissa Caiola RDN, LDN    Virtual Visit No    Medication changes reported     No    Fall or balance concerns reported    No    Warm-up and Cool-down Performed on first and last piece of equipment    Resistance Training Performed Yes    VAD Patient? No    PAD/SET Patient? No      Pain Assessment   Currently in Pain? No/denies              Social History   Tobacco Use  Smoking Status Former Smoker  . Quit date: 09/09/2020  . Years since quitting: 0.2  Smokeless Tobacco Never Used    Goals Met:  Independence with exercise equipment Exercise tolerated well No report of cardiac concerns or symptoms Strength training completed today  Goals Unmet:  Not Applicable  Comments: Pt able to follow exercise prescription today without complaint.  Will continue to monitor for progression.    Dr. Emily Filbert is Medical Director for [place].  Dr. Ottie Glazier is Medical Director for [facility] Pulmonary Rehabilitation.

## 2020-12-03 DIAGNOSIS — I213 ST elevation (STEMI) myocardial infarction of unspecified site: Secondary | ICD-10-CM | POA: Diagnosis not present

## 2020-12-03 DIAGNOSIS — Z955 Presence of coronary angioplasty implant and graft: Secondary | ICD-10-CM

## 2020-12-03 NOTE — Progress Notes (Signed)
Daily Session Note  Patient Details  Name: Adam Carter MRN: [id number] Date of Birth: 1975-11-08 Referring Provider:   Flowsheet Row Cardiac Rehab from 10/08/2020 in [facility] Cardiac and Pulmonary Rehab  Referring Provider Kathlyn Sacramento MD       Encounter Date: 12/03/2020  Check In:  Session Check In - 12/03/20 1723       Check-In   Supervising physician immediately available to respond to emergencies See telemetry face sheet for immediately available ER MD    Location [facility]-Cardiac & Pulmonary Rehab    Staff Present Birdie Sons, MPA, Elveria Rising, BA, ACSM CEP, Exercise Physiologist;Joseph Tessie Fass RCP,RRT,BSRT    Virtual Visit No    Medication changes reported     No    Fall or balance concerns reported    No    Warm-up and Cool-down Performed on first and last piece of equipment    Resistance Training Performed Yes    VAD Patient? No    PAD/SET Patient? No      Pain Assessment   Currently in Pain? No/denies                Social History   Tobacco Use  Smoking Status Former   Pack years: 0.00   Types: Cigarettes   Quit date: 09/09/2020   Years since quitting: 0.2  Smokeless Tobacco Never    Goals Met:  Independence with exercise equipment Exercise tolerated well No report of cardiac concerns or symptoms Strength training completed today  Goals Unmet:  Not Applicable  Comments: Pt able to follow exercise prescription today without complaint.  Will continue to monitor for progression.    Dr. Emily Filbert is Medical Director for [place].  Dr. Ottie Glazier is Medical Director for [facility] Pulmonary Rehabilitation.

## 2020-12-06 NOTE — Progress Notes (Signed)
Patient ID: Adam Carter, male    DOB: [birth year], 45 y.o.   MRN: [id number]  HPI  Adam Carter is a 45 y/o male with a history of CAD, hyperlipidemia, VF with arrest, previous tobacco use and chronic heart failure.   Echo report from 10/23/20 reviewed and showed an EF of 40-45%. Echo report from 09/08/20 reviewed and showed an EF of 30-35% along with mild LVH.   RHC/ LHC done 09/07/20 showed: The left ventricular ejection fraction is 25-35% by visual estimate. There is moderate to severe left ventricular systolic dysfunction. LV end diastolic pressure is moderately elevated. Ost LAD to Prox LAD lesion is 100% stenosed. Post intervention, there is a 0% residual stenosis. A drug-eluting stent was successfully placed using a STENT RESOLUTE ONYX 3.5X12. Mid LAD lesion is 30% stenosed.   1.  Out of hospital cardiac arrest with anterior ST elevation myocardial infarction due to ostial occlusion of the LAD.  No other obstructive disease. 2.  Moderately severely reduced LV systolic function with an EF of 25 to 30% with severe anterior/apical hypokinesis. 3.  Successful angioplasty and drug-eluting stent placement to the ostial LAD. 4.  Right heart catheterization was performed after PCI and showed mildly elevated filling pressures, mild pulmonary hypertension and normal cardiac output.  PA sat was 78.7%.  Fick cardiac output was 6.17 with a cardiac index of 3.5.  CPO was 1.4.  Based on these hemodynamic numbers, no hemodynamic support was utilized.  Admitted 09/07/20 due to out of hospital cardiac arrest due to anterior STEMI with subsequent VF. Cardiology and nephrology consults obtained. Cath done with stent placed. Successfully extubated. Lifevest placed. Given IV antibiotics due to pneumonia. Weaned off oxygen down to room air. Initially needed pressors due to septic shock but BP stabilized and weaned off pressors. Does of IV lasix given. Discharged after 6 days.   He presents today for a follow-up visit  with a chief complaint of minimal fatigue upon moderate exertion. He has associated light headedness, sternal pain and anxiety along with this. He denies any cough, shortness of breath, chest pain, pedal edema, palpitations, abdominal distention or weight gain.   He admits that he's quite anxious about his health especially when he's at work or if he goes out alone to [employer] or elsewhere. Cardiac event occurred at work and he's concerned going out alone somewhere that no one will know CPR.    Past Medical History:  Diagnosis Date   CHF (congestive heart failure) (HCC)    Coronary artery disease    occluded ostial LAD with otherwise no obstructive disease.   PCI/DES to ostial LAD   HFrEF (heart failure with reduced ejection fraction) (HCC)    EF 30-35%   History of cardiac arrest    VF arrest 3/14   History of ST elevation myocardial infarction (STEMI)    09/07/20 anteriolateral STEMI   History of tobacco use    History of ventricular fibrillation    3/14   Hyperlipidemia LDL goal <70    Past Surgical History:  Procedure Laterality Date   CARDIAC CATHETERIZATION     CORONARY/GRAFT ACUTE MI REVASCULARIZATION N/A 09/07/2020   Procedure: Coronary/Graft Acute MI Revascularization;  Surgeon: Iran Ouch, MD;  Location: [facility] INVASIVE CV LAB;  Service: Cardiovascular;  Laterality: N/A;   RIGHT/LEFT HEART CATH AND CORONARY ANGIOGRAPHY N/A 09/07/2020   Procedure: RIGHT/LEFT HEART CATH AND CORONARY ANGIOGRAPHY;  Surgeon: Iran Ouch, MD;  Location: [facility] INVASIVE CV LAB;  Service: Cardiovascular;  Laterality:  N/A;   Family History  Problem Relation Age of Onset   Diabetes Father    Social History   Tobacco Use   Smoking status: Former    Pack years: 0.00    Types: Cigarettes    Quit date: 09/09/2020    Years since quitting: 0.2   Smokeless tobacco: Never  Substance Use Topics   Alcohol use: Not Currently    Comment: rare   Allergies  Allergen Reactions    Bacitracin-Neomycin-Polymyxin Hives   Bupropion Nausea Only    Passed out   Prior to Admission medications   Medication Sig Start Date End Date Taking? Authorizing Provider  aspirin 81 MG chewable tablet Chew 1 tablet (81 mg total) by mouth daily. 09/14/20  Yes Enedina Finner, MD  atorvastatin (LIPITOR) 80 MG tablet Take 1 tablet (80 mg total) by mouth daily at 6 PM. 09/13/20  Yes Enedina Finner, MD  furosemide (LASIX) 20 MG tablet Take 1 tablet (20 mg total) by mouth daily as needed. 10/09/20  Yes Marisue Ivan D, PA-C  Multiple Vitamin (MULTIVITAMIN WITH MINERALS) TABS tablet Take 1 tablet by mouth daily. 09/14/20  Yes Enedina Finner, MD  pantoprazole (PROTONIX) 20 MG tablet Take 1 tablet (20 mg total) by mouth daily. 11/16/20 05/15/21 Yes Visser, Jacquelyn D, PA-C  potassium chloride (KLOR-CON) 10 MEQ tablet Take 1 tablet twice a day for 3 days. Then take 1 tablet daily as needed when taking Lasix. Patient taking differently: Take 1 tablet daily as needed when taking Lasix. 12/01/20  Yes Visser, Jacquelyn D, PA-C  sacubitril-valsartan (ENTRESTO) 24-26 MG Take 1 tablet by mouth 2 (two) times daily. 10/26/20  Yes Marisue Ivan D, PA-C  ticagrelor (BRILINTA) 90 MG TABS tablet Take 1 tablet (90 mg total) by mouth 2 (two) times daily. 09/13/20  Yes Enedina Finner, MD    Review of Systems  Constitutional:  Positive for fatigue. Negative for appetite change.  HENT:  Negative for congestion, postnasal drip and sore throat.   Eyes: Negative.   Respiratory:  Negative for cough and shortness of breath.   Cardiovascular:  Negative for chest pain, palpitations and leg swelling.  Gastrointestinal:  Negative for abdominal distention and abdominal pain.  Endocrine: Negative.   Genitourinary: Negative.   Musculoskeletal:  Positive for arthralgias (sternal pain when sneezing). Negative for back pain.  Skin: Negative.   Allergic/Immunologic: Negative.   Neurological:  Positive for light-headedness (sometimes with  heat). Negative for dizziness.  Hematological:  Negative for adenopathy. Bruises/bleeds easily.  Psychiatric/Behavioral:  Negative for dysphoric mood and sleep disturbance (sleeping on 3 pillows). The patient is nervous/anxious.    Vitals:   12/07/20 1023  BP: 110/60  Pulse: 72  Resp: 18  SpO2: 98%  Weight: 168 lb 8 oz (76.4 kg)  Height: 5\' 7"  (1.702 m)   Wt Readings from Last 3 Encounters:  12/07/20 168 lb 8 oz (76.4 kg)  11/26/20 173 lb (78.5 kg)  10/26/20 173 lb (78.5 kg)   Lab Results  Component Value Date   CREATININE 0.88 11/30/2020   CREATININE 0.88 10/26/2020   CREATININE 0.98 09/16/2020   Physical Exam Vitals and nursing note reviewed.  Constitutional:      Appearance: Normal appearance.  HENT:     Head: Normocephalic and atraumatic.  Cardiovascular:     Rate and Rhythm: Normal rate and regular rhythm.  Pulmonary:     Effort: Pulmonary effort is normal. No respiratory distress.     Breath sounds: No wheezing or rales.  Abdominal:  General: There is no distension.     Palpations: Abdomen is soft.  Musculoskeletal:        General: No tenderness.     Cervical back: Normal range of motion and neck supple.     Right lower leg: No edema.     Left lower leg: No edema.  Skin:    General: Skin is warm and dry.  Neurological:     General: No focal deficit present.     Mental Status: He is alert and oriented to person, place, and time.  Psychiatric:        Mood and Affect: Mood normal.        Behavior: Behavior normal.        Thought Content: Thought content normal.   Assessment & Plan:  1: Chronic heart failure with reduced ejection fraction (although EF improving)- - NYHA class II - euvolemic today - weighing daily & weight chart reviewed; reminded to call for an overnight weight gain of > 2 pounds or a weekly weight gain of > 5 pounds - weight down 12 pounds from last visit here 2 months ago - not adding salt and has been diligent about reading food  labels for sodium content and is trying to keep his daily consumption to around 2000mg / day - brought food log in for review - drinking ~ 2L of fluid / day - on GDMT of entresto - will add carvedilol 3.125mg  BID; BP may limit ability to titrate entresto or add MRA/SGLT2 - BNP 09/16/20 was 119.7 - no tobacco use since March 2022 - PharmD reconciled medications with the patient  2: CAD- - saw cardiology April 2022) 11/26/20; returns 01/04/21 - no longer wearing lifevest; admits to initially being anxious without it - still has sternal pain at times; no further rib pain - participating in cardiac rehab  - BMP 11/30/20 reviewed and showed sodium 139, potassium 3.4, creatinine 0.88 and GFR >60   Medication bottles reviewed.   Return in 2 months or sooner for any questions/problems before then.

## 2020-12-07 ENCOUNTER — Encounter: Payer: Self-pay | Admitting: Pharmacist

## 2020-12-07 ENCOUNTER — Other Ambulatory Visit: Payer: Self-pay

## 2020-12-07 ENCOUNTER — Ambulatory Visit: Payer: No Typology Code available for payment source | Attending: Family | Admitting: Family

## 2020-12-07 ENCOUNTER — Encounter: Payer: Self-pay | Admitting: Family

## 2020-12-07 ENCOUNTER — Encounter: Payer: No Typology Code available for payment source | Admitting: *Deleted

## 2020-12-07 VITALS — BP 110/60 | HR 72 | Resp 18 | Ht 67.0 in | Wt 168.5 lb

## 2020-12-07 DIAGNOSIS — I2510 Atherosclerotic heart disease of native coronary artery without angina pectoris: Secondary | ICD-10-CM | POA: Diagnosis not present

## 2020-12-07 DIAGNOSIS — Z7982 Long term (current) use of aspirin: Secondary | ICD-10-CM | POA: Diagnosis not present

## 2020-12-07 DIAGNOSIS — Z8674 Personal history of sudden cardiac arrest: Secondary | ICD-10-CM | POA: Insufficient documentation

## 2020-12-07 DIAGNOSIS — Z87891 Personal history of nicotine dependence: Secondary | ICD-10-CM | POA: Insufficient documentation

## 2020-12-07 DIAGNOSIS — Z955 Presence of coronary angioplasty implant and graft: Secondary | ICD-10-CM | POA: Insufficient documentation

## 2020-12-07 DIAGNOSIS — I509 Heart failure, unspecified: Secondary | ICD-10-CM | POA: Insufficient documentation

## 2020-12-07 DIAGNOSIS — E785 Hyperlipidemia, unspecified: Secondary | ICD-10-CM | POA: Insufficient documentation

## 2020-12-07 DIAGNOSIS — I252 Old myocardial infarction: Secondary | ICD-10-CM | POA: Insufficient documentation

## 2020-12-07 DIAGNOSIS — I5020 Unspecified systolic (congestive) heart failure: Secondary | ICD-10-CM

## 2020-12-07 DIAGNOSIS — Z79899 Other long term (current) drug therapy: Secondary | ICD-10-CM | POA: Diagnosis not present

## 2020-12-07 DIAGNOSIS — I213 ST elevation (STEMI) myocardial infarction of unspecified site: Secondary | ICD-10-CM | POA: Diagnosis not present

## 2020-12-07 MED ORDER — CARVEDILOL 3.125 MG PO TABS: 3.1250 mg | ORAL_TABLET | Freq: Two times a day (BID) | ORAL | 3 refills | Status: DC

## 2020-12-07 NOTE — Progress Notes (Signed)
Daily Session Note  Patient Details  Name: Adam Carter MRN: [id number] Date of Birth: 12-24-1975 Referring Provider:   Flowsheet Row Cardiac Rehab from 10/08/2020 in [facility]  Referring Provider Kathlyn Sacramento MD       Encounter Date: 12/07/2020  Check In:  Session Check In - 12/07/20 1714       Check-In   Supervising physician immediately available to respond to emergencies See telemetry face sheet for immediately available ER MD    Location [facility]    Staff Present Renita Papa, RN Margurite Auerbach, MS, ASCM CEP, Exercise Physiologist;Kelly Amedeo Plenty, BS, ACSM CEP, Exercise Physiologist    Virtual Visit No    Medication changes reported     Yes    Comments starting carvedilol this week.    Fall or balance concerns reported    No    Warm-up and Cool-down Performed on first and last piece of equipment    Resistance Training Performed Yes    VAD Patient? No    PAD/SET Patient? No      Pain Assessment   Currently in Pain? No/denies                Social History   Tobacco Use  Smoking Status Former   Pack years: 0.00   Types: Cigarettes   Quit date: 09/09/2020   Years since quitting: 0.2  Smokeless Tobacco Never    Goals Met:  Independence with exercise equipment Exercise tolerated well No report of cardiac concerns or symptoms Strength training completed today  Goals Unmet:  Not Applicable  Comments: Pt able to follow exercise prescription today without complaint.  Will continue to monitor for progression.    Dr. Emily Filbert is Medical Director for [facility].  Dr. Ottie Glazier is Medical Director for [facility].

## 2020-12-07 NOTE — Progress Notes (Signed)
[facility] - HEART FAILURE CLINIC - PHARMACIST COUNSELING NOTE  Guideline-Directed Medical Therapy/Evidence Based Medicine  ACE/ARB/ARNI: Sacubitril-valsartan 24-26 mg twice daily Beta Blocker:  N/A Aldosterone Antagonist:  N/A Diuretic: Furosemide 20 mg daily as needed SGLT2i:  N/A  Adherence Assessment  Do you ever forget to take your medication? [] Yes [x] No  Do you ever skip doses due to side effects? [] Yes [x] No  Do you have trouble affording your medicines? [] Yes [x] No  Are you ever unable to pick up your medication due to transportation difficulties? [] Yes [x] No  Do you ever stop taking your medications because you don't believe they are helping? [] Yes [x] No  Do you check your weight daily? [x] Yes [] No   Adherence strategy: Pillbox  Barriers to obtaining medications: Pt expressed his Brilinta is expensive. Informed pt to try [email or web address] to use the coupon online; pt has this coupon on his phone already. Informed pt to follow up with his pharmacy if this coupon is being applied or not; if not, use coupon to hopefully get co-pay as low as $5  Vital signs: HR 72, BP 110/60, weight (pounds) 168.8 ECHO: Date 10/23/2020, EF 40-45% , notes improved from 09/08/2020 EF 30-35%  BMP Latest Ref Rng & Units 11/30/2020 10/26/2020 09/16/2020  Glucose 70 - 99 mg/dL ) ) )  BUN 6 - 20 mg/dL 7 13 13   Creatinine 0.61 - 1.24 mg/dL    Sodium 135 - 145 mmol/L 139 138 136  Potassium 3.5 - 5.1 mmol/L 3.4(L) 3.7 4.3  Chloride 98 - 111 mmol/L 104 107 104  CO2 22 - 32 mmol/L 26 22 24   Calcium 8.9 - 10.3 mg/dL 9.1 9.2 )    Past Medical History:  Diagnosis Date   CHF (congestive heart failure) (HCC)    Coronary artery disease    occluded ostial LAD with otherwise no obstructive disease.   PCI/DES to ostial LAD   HFrEF (heart failure with reduced ejection fraction) (HCC)    EF 30-35%   History of cardiac arrest    VF arrest 3/14   History of  ST elevation myocardial infarction (STEMI)    09/07/20 anteriolateral STEMI   History of tobacco use    History of ventricular fibrillation    3/14   Hyperlipidemia LDL goal <70     ASSESSMENT 45 year old male who presents to the HF clinic for a follow up visit. Pt expressed he is anxious about going out in public without someone who is CPR trained because of his last hospital admission. Pt has made significant dietary changes and keeps a log with him. He also has a log of his BP which ranges from the 100-110/60-70s. Pt wanted to ensure he needed all of the medications he is currently prescribed. I reviewed the indication for each of his medications and pt verbalized understanding.   Recent ED Visit (past 6 months): Date - 09/07/2020, [facility] - cardiac arrest  PLAN CHF -continue furosemide 20 mg daily as needed and Entresto 24-26 mg twice daily -discussed with provider to potentially start SGLT2i; decision made to start pt on BB (see below) -start carvedilol 3.125 mg twice daily   CAD/HLD/hx of STEMI -continue aspirin 81 mg daily, Brilinta 90 mg twice daily, and atorvastatin 80 mg daily -continue pantoprazole 20 mg daily for ulcer prophylaxis while on DAPT -see carvedilol above    Time spent: 10 minutes  427(C, PharmD Pharmacy Resident  12/07/2020 11:15 AM     Current Outpatient Medications:  aspirin 81 MG chewable tablet, Chew 1 tablet (81 mg total) by mouth daily., Disp: 30 tablet, Rfl: 2   atorvastatin (LIPITOR) 80 MG tablet, Take 1 tablet (80 mg total) by mouth daily at 6 PM., Disp: 30 tablet, Rfl: 3   carvedilol (COREG) 3.125 MG tablet, Take 1 tablet (3.125 mg total) by mouth 2 (two) times daily., Disp: 180 tablet, Rfl: 3   furosemide (LASIX) 20 MG tablet, Take 1 tablet (20 mg total) by mouth daily as needed., Disp: 90 tablet, Rfl: 3   Multiple Vitamin (MULTIVITAMIN WITH MINERALS) TABS tablet, Take 1 tablet by mouth daily., Disp: 30 tablet, Rfl: 0   pantoprazole  (PROTONIX) 20 MG tablet, Take 1 tablet (20 mg total) by mouth daily., Disp: 30 tablet, Rfl: 5   potassium chloride (KLOR-CON) 10 MEQ tablet, Take 1 tablet twice a day for 3 days. Then take 1 tablet daily as needed when taking Lasix. (Patient taking differently: Take 1 tablet daily as needed when taking Lasix.), Disp: 30 tablet, Rfl: 3   sacubitril-valsartan (ENTRESTO) 24-26 MG, Take 1 tablet by mouth 2 (two) times daily., Disp: 60 tablet, Rfl: 3   ticagrelor (BRILINTA) 90 MG TABS tablet, Take 1 tablet (90 mg total) by mouth 2 (two) times daily., Disp: 60 tablet, Rfl: 3   COUNSELING POINTS/CLINICAL PEARLS  Carvedilol (Goal: 25-50 mg twice daily)  Patient should avoid activities requiring mental alertness or coordination until drug effects are realized, as drug may cause dizziness.  This drug may cause bradyarrhythmia, diarrhea, nausea, vomiting, arthralgia, back pain, myalgia, headache, vision disorder, erectile dysfunction, reduced libido, or fatigue.  Instruct patient to report signs/symptoms of adverse cardiovascular effects such as hypotension (especially in elderly patients), arrhythmias, syncope, palpitations, angina, or edema.  Drug may mask symptoms of hypoglycemia. Advise diabetic patients to carefully monitor blood sugar levels.  Patient should take drug with food.  Advise patient against sudden discontinuation of drug. Instruct patient to take a missed dose as soon as possible, but if next dose is in less than 8 h, skip the missed dose.  Tell patient planning major surgery with anesthesia to alert physician that drug is being used, as drug impairs ability of heart to respond to reflex adrenergic stimuli.  Patient should take extended-release tablet with or immediately following meals.   DRUGS TO CAUTION IN HEART FAILURE  Drug or Class Mechanism  Analgesics NSAIDs COX-2 inhibitors Glucocorticoids  Sodium and water retention, increased systemic vascular resistance, decreased response  to diuretics   Diabetes Medications Metformin Thiazolidinediones Rosiglitazone (Avandia) Pioglitazone (Actos) DPP4 Inhibitors Saxagliptin (Onglyza) Sitagliptin (Januvia)   Lactic acidosis Possible calcium channel blockade   Unknown  Antiarrhythmics Class I  Flecainide Disopyramide Class III Sotalol Other Dronedarone  Negative inotrope, proarrhythmic   Proarrhythmic, beta blockade  Negative inotrope  Antihypertensives Alpha Blockers Doxazosin Calcium Channel Blockers Diltiazem Verapamil Nifedipine Central Alpha Adrenergics Moxonidine Peripheral Vasodilators Minoxidil  Increases renin and aldosterone  Negative inotrope    Possible sympathetic withdrawal  Unknown  Anti-infective Itraconazole Amphotericin B  Negative inotrope Unknown  Hematologic Anagrelide Cilostazol   Possible inhibition of PD IV Inhibition of PD III causing arrhythmias  Neurologic/Psychiatric Stimulants Anti-Seizure Drugs Carbamazepine Pregabalin Antidepressants Tricyclics Citalopram Parkinsons Bromocriptine Pergolide Pramipexole Antipsychotics Clozapine Antimigraine Ergotamine Methysergide Appetite suppressants Bipolar Lithium  Peripheral alpha and beta agonist activity  Negative inotrope and chronotrope Calcium channel blockade  Negative inotrope, proarrhythmic Dose-dependent QT prolongation  Excessive serotonin activity/valvular damage Excessive serotonin activity/valvular damage Unknown  IgE mediated hypersensitivy, calcium channel blockade  Excessive serotonin activity/valvular damage Excessive serotonin activity/valvular damage Valvular damage  Direct myofibrillar degeneration, adrenergic stimulation  Antimalarials Chloroquine Hydroxychloroquine Intracellular inhibition of lysosomal enzymes  Urologic Agents Alpha Blockers Doxazosin Prazosin Tamsulosin Terazosin  Increased renin and aldosterone  Adapted from Page Williemae Natter, et al. "Drugs That  May Cause or Exacerbate Heart Failure: A Scientific Statement from the American Heart  Association." Circulation 2016; 134:e32-e69. DOI: 10.1161/CIR.0000000000000426   MEDICATION ADHERENCES TIPS AND STRATEGIES Taking medication as prescribed improves patient outcomes in heart failure (reduces hospitalizations, improves symptoms, increases survival) Side effects of medications can be managed by decreasing doses, switching agents, stopping drugs, or adding additional therapy. Please let someone in the Heart Failure Clinic know if you have having bothersome side effects so we can modify your regimen. Do not alter your medication regimen without talking to [place].  Medication reminders can help patients remember to take drugs on time. If you are missing or forgetting doses you can try linking behaviors, using pill boxes, or an electronic reminder like an alarm on your phone or an app. Some people can also get automated phone calls as medication reminders.

## 2020-12-07 NOTE — Patient Instructions (Addendum)
Continue weighing daily and call for an overnight weight gain of > 2 pounds or a weekly weight gain of >5 pounds.    Begin taking carvedilol as 1 tablet twice a day

## 2020-12-09 ENCOUNTER — Encounter: Payer: No Typology Code available for payment source | Admitting: *Deleted

## 2020-12-09 ENCOUNTER — Encounter: Payer: Self-pay | Admitting: *Deleted

## 2020-12-09 ENCOUNTER — Other Ambulatory Visit: Payer: Self-pay

## 2020-12-09 DIAGNOSIS — Z955 Presence of coronary angioplasty implant and graft: Secondary | ICD-10-CM

## 2020-12-09 DIAGNOSIS — I213 ST elevation (STEMI) myocardial infarction of unspecified site: Secondary | ICD-10-CM

## 2020-12-09 NOTE — Telephone Encounter (Signed)
Faxed Entresto approval to [employer] -- [phone]

## 2020-12-09 NOTE — Telephone Encounter (Signed)
[employer] calling to check status of PA.    Please fax when obtained to [phone]

## 2020-12-09 NOTE — Progress Notes (Signed)
Cardiac Individual Treatment Plan  Patient Details  Name: Adam Carter MRN: [id number] Date of Birth: September 03, 1975 Referring Provider:   Flowsheet Row Cardiac Rehab from 10/08/2020 in [facility] Cardiac and Pulmonary Rehab  Referring Provider Kathlyn Sacramento MD       Initial Encounter Date:  Flowsheet Row Cardiac Rehab from 10/08/2020 in [facility] Cardiac and Pulmonary Rehab  Date 10/08/20       Visit Diagnosis: ST elevation myocardial infarction (STEMI), unspecified artery [facility])  Status post coronary artery stent placement  Patient's Home Medications on Admission:  Current Outpatient Medications:    aspirin 81 MG chewable tablet, Chew 1 tablet (81 mg total) by mouth daily., Disp: 30 tablet, Rfl: 2   atorvastatin (LIPITOR) 80 MG tablet, Take 1 tablet (80 mg total) by mouth daily at 6 PM., Disp: 30 tablet, Rfl: 3   carvedilol (COREG) 3.125 MG tablet, Take 1 tablet (3.125 mg total) by mouth 2 (two) times daily., Disp: 180 tablet, Rfl: 3   furosemide (LASIX) 20 MG tablet, Take 1 tablet (20 mg total) by mouth daily as needed., Disp: 90 tablet, Rfl: 3   Multiple Vitamin (MULTIVITAMIN WITH MINERALS) TABS tablet, Take 1 tablet by mouth daily., Disp: 30 tablet, Rfl: 0   pantoprazole (PROTONIX) 20 MG tablet, Take 1 tablet (20 mg total) by mouth daily., Disp: 30 tablet, Rfl: 5   potassium chloride (KLOR-CON) 10 MEQ tablet, Take 1 tablet twice a day for 3 days. Then take 1 tablet daily as needed when taking Lasix. (Patient taking differently: Take 1 tablet daily as needed when taking Lasix.), Disp: 30 tablet, Rfl: 3   sacubitril-valsartan (ENTRESTO) 24-26 MG, Take 1 tablet by mouth 2 (two) times daily., Disp: 60 tablet, Rfl: 3   ticagrelor (BRILINTA) 90 MG TABS tablet, Take 1 tablet (90 mg total) by mouth 2 (two) times daily., Disp: 60 tablet, Rfl: 3  Past Medical History: Past Medical History:  Diagnosis Date   CHF (congestive heart failure) ([place])    Coronary artery disease    occluded ostial LAD with  otherwise no obstructive disease.   PCI/DES to ostial LAD   HFrEF (heart failure with reduced ejection fraction) ([facility])    EF 30-35%   History of cardiac arrest    VF arrest 3/14   History of ST elevation myocardial infarction (STEMI)    09/07/20 anteriolateral STEMI   History of tobacco use    History of ventricular fibrillation    3/14   Hyperlipidemia LDL goal <70     Tobacco Use: Social History   Tobacco Use  Smoking Status Former   Pack years: 0.00   Types: Cigarettes   Quit date: 09/09/2020   Years since quitting: 0.2  Smokeless Tobacco Never    Labs: Recent Review Flowsheet Data     Labs for ITP Cardiac and Pulmonary Rehab Latest Ref Rng & Units 09/08/2020 09/09/2020 09/11/2020 09/16/2020 11/30/2020   Cholestrol 0 - 200 mg/dL - 126 - 117 87   LDLCALC 0 - 99 mg/dL - 75 - 70 40   HDL >40 mg/dL - 34(L) - 26(L) 27(L)   Trlycerides <150 mg/dL 133 86 176(H) 106 99   Hemoglobin A1c 4.8 - 5.6 % 6.0(H) - - - -   PHART 7.350 - 7.450 7.33(L) - - - -   PCO2ART 32.0 - 48.0 mmHg 43 - - - -   HCO3 20.0 - 28.0 mmol/L 22.7 - - - -   ACIDBASEDEF 0.0 - 2.0 mmol/L 3.2(H) - - - -  O2SAT % 98.7 - 67.7 - -        Exercise Target Goals: Exercise Program Goal: Individual exercise prescription set using results from initial 6 min walk test and THRR while considering  patient's activity barriers and safety.   Exercise Prescription Goal: Initial exercise prescription builds to 30-45 minutes a day of aerobic activity, 2-3 days per week.  Home exercise guidelines will be given to patient during program as part of exercise prescription that the participant will acknowledge.   Education: Aerobic Exercise: - Group verbal and visual presentation on the components of exercise prescription. Introduces F.I.T.T principle from ACSM for exercise prescriptions.  Reviews F.I.T.T. principles of aerobic exercise including progression. Written material given at graduation. Flowsheet Row Cardiac Rehab from  12/02/2020 in [facility] Cardiac and Pulmonary Rehab  Date 10/14/20  Educator AS  Instruction Review Code 1- Verbalizes Understanding       Education: Resistance Exercise: - Group verbal and visual presentation on the components of exercise prescription. Introduces F.I.T.T principle from ACSM for exercise prescriptions  Reviews F.I.T.T. principles of resistance exercise including progression. Written material given at graduation. Flowsheet Row Cardiac Rehab from 12/02/2020 in [facility] Cardiac and Pulmonary Rehab  Date 10/21/20  Educator AS  Instruction Review Code 1- Verbalizes Understanding        Education: Exercise & Equipment Safety: - Individual verbal instruction and demonstration of equipment use and safety with use of the equipment. Flowsheet Row Cardiac Rehab from 12/02/2020 in [facility] Cardiac and Pulmonary Rehab  Education need identified 10/08/20  Date 10/08/20  Educator [place]  Instruction Review Code 1- Verbalizes Understanding       Education: Exercise Physiology & General Exercise Guidelines: - Group verbal and written instruction with models to review the exercise physiology of the cardiovascular system and associated critical values. Provides general exercise guidelines with specific guidelines to those with heart or lung disease.    Education: Flexibility, Balance, Mind/Body Relaxation: - Group verbal and visual presentation with interactive activity on the components of exercise prescription. Introduces F.I.T.T principle from ACSM for exercise prescriptions. Reviews F.I.T.T. principles of flexibility and balance exercise training including progression. Also discusses the mind body connection.  Reviews various relaxation techniques to help reduce and manage stress (i.e. Deep breathing, progressive muscle relaxation, and visualization). Balance handout provided to take home. Written material given at graduation. Flowsheet Row Cardiac Rehab from 12/02/2020 in [facility] Cardiac and Pulmonary  Rehab  Date 10/28/20  Educator AS  Instruction Review Code 1- Verbalizes Understanding       Activity Barriers & Risk Stratification:  Activity Barriers & Cardiac Risk Stratification - 10/08/20 0932       Activity Barriers & Cardiac Risk Stratification   Activity Barriers Neck/Spine Problems;Back Problems;Other (comment)    Comments Rib pain from chest compressions    Cardiac Risk Stratification High             6 Minute Walk:  6 Minute Walk     Row Name 10/08/20 1419         6 Minute Walk   Phase Initial     Distance 1465 feet     Walk Time 6 minutes     # of Rest Breaks 0     MPH 2.77     METS 4.54     RPE 7     Perceived Dyspnea  1     VO2 Peak 15.91     Symptoms No     Resting HR 68 bpm  Resting BP 106/68     Resting Oxygen Saturation  96 %     Exercise Oxygen Saturation  during 6 min walk 97 %     Max Ex. HR 97 bpm     Max Ex. BP 116/68     2 Minute Post BP 108/70              Oxygen Initial Assessment:   Oxygen Re-Evaluation:   Oxygen Discharge (Final Oxygen Re-Evaluation):   Initial Exercise Prescription:  Initial Exercise Prescription - 10/08/20 1100       Date of Initial Exercise RX and Referring Provider   Date 10/08/20    Referring Provider Kathlyn Sacramento MD      Treadmill   MPH 2.8    Grade 2    Minutes 15    METs 3.9      NuStep   Level 4    SPM 80    Minutes 15    METs 4.5      Recumbant Elliptical   Level 3    RPM 50    Minutes 15    METs 4.5      REL-XR   Level 4    Speed 50    Minutes 15    METs 4.5      T5 Nustep   Level 3    SPM 80    Minutes 15    METs 4.5      Prescription Details   Frequency (times per week) 3    Duration Progress to 30 minutes of continuous aerobic without signs/symptoms of physical distress      Intensity   THRR 40-80% of Max Heartrate 111-155    Ratings of Perceived Exertion 11-13    Perceived Dyspnea 0-4      Progression   Progression Continue to progress  workloads to maintain intensity without signs/symptoms of physical distress.      Resistance Training   Training Prescription Yes    Weight 5 lb    Reps 10-15             Perform Capillary Blood Glucose checks as needed.  Exercise Prescription Changes:   Exercise Prescription Changes     Row Name 10/08/20 1100 10/15/20 1500 10/28/20 1300 11/12/20 1600 11/25/20 1300     Response to Exercise   Blood Pressure (Admit) 106/68 100/62 110/60 92/64 106/58   Blood Pressure (Exercise) 116/68 130/64 124/70 122/68 108/58   Blood Pressure (Exit) 108/70 98/64 94/62  90/60 100/56   Heart Rate (Admit) 68 bpm 67 bpm 77 bpm 76 bpm 61 bpm   Heart Rate (Exercise) 97 bpm 129 bpm 124 bpm 137 bpm 105 bpm   Heart Rate (Exit) 66 bpm 99 bpm 84 bpm 92 bpm 81 bpm   Oxygen Saturation (Admit) 96 % -- -- -- --   Oxygen Saturation (Exercise) 97 % -- -- -- --   Oxygen Saturation (Exit) 97 % -- -- -- --   Rating of Perceived Exertion (Exercise) 7 13 12 13 13    Perceived Dyspnea (Exercise) 1 -- -- -- --   Symptoms none none none none none   Comments walk test results 2nd full day exercise -- -- --   Duration -- -- -- Continue with 30 min of aerobic exercise without signs/symptoms of physical distress. Continue with 30 min of aerobic exercise without signs/symptoms of physical distress.   Intensity -- -- -- THRR unchanged THRR unchanged     Progression   Progression -- -- --  Continue to progress workloads to maintain intensity without signs/symptoms of physical distress. Continue to progress workloads to maintain intensity without signs/symptoms of physical distress.   Average METs -- -- -- 5.39 5.37     Resistance Training   Training Prescription -- Yes Yes Yes Yes   Weight -- 5 lb 5 lb 4 lb 4 lb   Reps -- 10-15 10-15 10-15 10-15     Interval Training   Interval Training -- -- -- No No     Treadmill   MPH -- 2.8 3 3.7 3.6   Grade -- 2 2 4 4    Minutes -- 15 15 15 15    METs -- 3.9 4.2 5.87 5.74      NuStep   Level -- -- 5 7 --   Minutes -- -- 15 15 --   METs -- -- 4.12 5.3 --     Recumbant Elliptical   Level -- 3 -- -- --   RPM -- 50 -- -- --   Minutes -- 15 -- -- --   METs -- 4.5 -- -- --     REL-XR   Level -- 5 -- -- --   Speed -- 50 -- -- --   Minutes -- 15 -- -- --   METs -- 3.9 -- -- --     Biostep-RELP   Level -- -- -- 8 6   Minutes -- -- -- 15 15   METs -- -- -- 5 5     Home Exercise Plan   Plans to continue exercise at -- -- -- Home (comment)  walking --   Frequency -- -- -- Add 2 additional days to program exercise sessions. --   Initial Home Exercises Provided -- -- -- 10/26/20 --            Exercise Comments:   Exercise Goals and Review:   Exercise Goals     Row Name 10/08/20 1147             Exercise Goals   Increase Physical Activity Yes       Intervention Provide advice, education, support and counseling about physical activity/exercise needs.;Develop an individualized exercise prescription for aerobic and resistive training based on initial evaluation findings, risk stratification, comorbidities and participant's personal goals.       Expected Outcomes Short Term: Attend rehab on a regular basis to increase amount of physical activity.;Long Term: Add in home exercise to make exercise part of routine and to increase amount of physical activity.;Long Term: Exercising regularly at least 3-5 days a week.       Increase Strength and Stamina Yes       Intervention Provide advice, education, support and counseling about physical activity/exercise needs.;Develop an individualized exercise prescription for aerobic and resistive training based on initial evaluation findings, risk stratification, comorbidities and participant's personal goals.       Expected Outcomes Short Term: Increase workloads from initial exercise prescription for resistance, speed, and METs.;Short Term: Perform resistance training exercises routinely during rehab and add in  resistance training at home;Long Term: Improve cardiorespiratory fitness, muscular endurance and strength as measured by increased METs and functional capacity (6MWT)       Able to understand and use rate of perceived exertion (RPE) scale Yes       Intervention Provide education and explanation on how to use RPE scale       Expected Outcomes Short Term: Able to use RPE daily in rehab to express subjective intensity level;Long  Term:  Able to use RPE to guide intensity level when exercising independently       Able to understand and use Dyspnea scale Yes       Intervention Provide education and explanation on how to use Dyspnea scale       Expected Outcomes Short Term: Able to use Dyspnea scale daily in rehab to express subjective sense of shortness of breath during exertion;Long Term: Able to use Dyspnea scale to guide intensity level when exercising independently       Knowledge and understanding of Target Heart Rate Range (THRR) Yes       Intervention Provide education and explanation of THRR including how the numbers were predicted and where they are located for reference       Expected Outcomes Short Term: Able to state/look up THRR;Short Term: Able to use daily as guideline for intensity in rehab;Long Term: Able to use THRR to govern intensity when exercising independently       Able to check pulse independently Yes       Intervention Provide education and demonstration on how to check pulse in carotid and radial arteries.;Review the importance of being able to check your own pulse for safety during independent exercise       Expected Outcomes Short Term: Able to explain why pulse checking is important during independent exercise;Long Term: Able to check pulse independently and accurately       Understanding of Exercise Prescription Yes       Intervention Provide education, explanation, and written materials on patient's individual exercise prescription       Expected Outcomes Short Term: Able to  explain program exercise prescription;Long Term: Able to explain home exercise prescription to exercise independently                Exercise Goals Re-Evaluation :  Exercise Goals Re-Evaluation     Row Name 10/12/20 1726 10/15/20 1551 10/26/20 1741 11/12/20 1605 11/25/20 1306     Exercise Goal Re-Evaluation   Exercise Goals Review Able to understand and use rate of perceived exertion (RPE) scale;Increase Physical Activity;Knowledge and understanding of Target Heart Rate Range (THRR);Understanding of Exercise Prescription;Increase Strength and Stamina;Able to check pulse independently Able to understand and use rate of perceived exertion (RPE) scale;Increase Physical Activity;Knowledge and understanding of Target Heart Rate Range (THRR);Understanding of Exercise Prescription;Increase Strength and Stamina;Able to check pulse independently Able to understand and use rate of perceived exertion (RPE) scale;Increase Physical Activity;Knowledge and understanding of Target Heart Rate Range (THRR);Understanding of Exercise Prescription;Increase Strength and Stamina;Able to check pulse independently Increase Physical Activity;Increase Strength and Stamina;Understanding of Exercise Prescription Increase Physical Activity;Increase Strength and Stamina   Comments Reviewed RPE and dyspnea scales, THR and program prescription with pt today.  Pt voiced understanding and was given a copy of goals to take home. Adam Carter is doing well for his first couple of exercise sessions at rehab. He is starting to get to know the routine a little bit better. Will continue to monitor. Reviewed home exercise with pt today.  Pt plans to walk for exercise.  Reviewed THR, pulse, RPE, sign and symptoms, pulse oximetery and when to call 911 or MD.  Also discussed weather considerations and indoor options.  Pt voiced understanding. Adam Carter has been doing well in rehab.  He is up to level 8 on BioStep.  We will continue to monitor his progress.  Adam Carter attends consistently and works at [employer] and in [employer] range.  Staff will review interval training  and monitor progress.   Expected Outcomes Short: Use RPE daily to regulate intensity. Long: Follow program prescription in THR. Short: Continue attendance with rehab Long: Exercise at home independently with appropriate exercise prescription Short: Add on 1 day of exercise at home, then add on 2nd day after Long: Exercise at home with confidence at appropriate measures Short: Increase hand weights Long: Continue to improve stamina Short: try intervals Long: increase MET level            Discharge Exercise Prescription (Final Exercise Prescription Changes):  Exercise Prescription Changes - 11/25/20 1300       Response to Exercise   Blood Pressure (Admit) 106/58    Blood Pressure (Exercise) 108/58    Blood Pressure (Exit) 100/56    Heart Rate (Admit) 61 bpm    Heart Rate (Exercise) 105 bpm    Heart Rate (Exit) 81 bpm    Rating of Perceived Exertion (Exercise) 13    Symptoms none    Duration Continue with 30 min of aerobic exercise without signs/symptoms of physical distress.    Intensity THRR unchanged      Progression   Progression Continue to progress workloads to maintain intensity without signs/symptoms of physical distress.    Average METs 5.37      Resistance Training   Training Prescription Yes    Weight 4 lb    Reps 10-15      Interval Training   Interval Training No      Treadmill   MPH 3.6    Grade 4    Minutes 15    METs 5.74      Biostep-RELP   Level 6    Minutes 15    METs 5             Nutrition:  Target Goals: Understanding of nutrition guidelines, daily intake of sodium <157m, cholesterol <2069m calories 30% from fat and 7% or less from saturated fats, daily to have 5 or more servings of fruits and vegetables.  Education: All About Nutrition: -Group instruction provided by verbal, written material, interactive activities, discussions,  models, and posters to present general guidelines for heart healthy nutrition including fat, fiber, MyPlate, the role of sodium in heart healthy nutrition, utilization of the nutrition label, and utilization of this knowledge for meal planning. Follow up email sent as well. Written material given at graduation. [facility]  Date 11/04/20  [facility] Review Code 1- Verbalizes Understanding       Biometrics:  Pre Biometrics - 10/08/20 0932       Pre Biometrics   Height 5' 7.75" (1.721 m)    Weight 178 lb (80.7 kg)    BMI (Calculated) 27.26    Single Leg Stand 30 seconds              Nutrition Therapy Plan and Nutrition Goals:  Nutrition Therapy & Goals - 10/28/20 1614       Nutrition Therapy   Diet Heart healthy, Low Na    Drug/Food Interactions Statins/Certain Fruits    Protein (specify units) 60g    Fiber 30 grams    Whole Grain Foods 3 servings    Saturated Fats 12 max. grams    Fruits and Vegetables 8 servings/day    Sodium 1.5 grams      Personal Nutrition Goals   Nutrition Goal ST: increase variety of vegetables (all colors in a week at least), try to  prep more in advance like bean salad  LT: get a larger variety of plant foods, meet fiber needs 30g    Comments Does not have sweets. Will have some cheerios as a snack. Will have water and small pepsi B: eggs (2) - 2 slices of bread (white wheat - b/c of girlfriends or whole grain) L: PB and J sandwich D: salmon, tuna steak, with vegetables  (mixes it up). Will sometimes have hamburger (lean), chili beans, [place]. Discussed heart healthy eaitng and planning meals - he feels that it can get hectic with cooking recipes every night.      Intervention Plan   Intervention Prescribe, educate and counsel regarding individualized specific dietary modifications aiming towards targeted core components such as weight, hypertension, lipid management,  diabetes, heart failure and other comorbidities.;Nutrition handout(s) given to patient.    Expected Outcomes Short Term Goal: Understand basic principles of dietary content, such as calories, fat, sodium, cholesterol and nutrients.;Short Term Goal: A plan has been developed with personal nutrition goals set during dietitian appointment.;Long Term Goal: Adherence to prescribed nutrition plan.             Nutrition Assessments:  MEDIFICTS Score Key: ?70 Need to make dietary changes  40-70 Heart Healthy Diet ? 40 Therapeutic Level Cholesterol Diet  Flowsheet Row Cardiac Rehab from 10/08/2020 in [facility] Cardiac and Pulmonary Rehab  Picture Your Plate Total Score on Admission 78      Picture Your Plate Scores: <32 Unhealthy dietary pattern with much room for improvement. 41-50 Dietary pattern unlikely to meet recommendations for good health and room for improvement. 51-60 More healthful dietary pattern, with some room for improvement.  >60 Healthy dietary pattern, although there may be some specific behaviors that could be improved.    Nutrition Goals Re-Evaluation:  Nutrition Goals Re-Evaluation     [place] Name 11/25/20 1739             Goals   Current Weight 171 lb (77.6 kg)       Nutrition Goal Eat less sodium.       Comment Adam Carter states he only eats red meat once a week. He eats chicken, fish and shrimp. He is working on eating vegetables. He wants to keep is sodium under 1536m per day.       Expected Outcome Short: look at nutrition labels for sodium. Long: maintain reduction in sodium intake.                Nutrition Goals Discharge (Final Nutrition Goals Re-Evaluation):  Nutrition Goals Re-Evaluation - 11/25/20 1739       Goals   Current Weight 171 lb (77.6 kg)    Nutrition Goal Eat less sodium.    Comment JCorene Corneastates he only eats red meat once a week. He eats chicken, fish and shrimp. He is working on eating vegetables. He wants to keep is sodium under 15030mper  day.    Expected Outcome Short: look at nutrition labels for sodium. Long: maintain reduction in sodium intake.             Psychosocial: Target Goals: Acknowledge presence or absence of significant depression and/or stress, maximize coping skills, provide positive support system. Participant is able to verbalize types and ability to use techniques and skills needed for reducing stress and depression.   Education: Stress, Anxiety, and Depression - Group verbal and visual presentation to define topics covered.  Reviews how body is impacted by stress, anxiety, and depression.  Also discusses healthy  ways to reduce stress and to treat/manage anxiety and depression.  Written material given at graduation. Flowsheet Row Cardiac Rehab from 12/02/2020 in [facility] Cardiac and Pulmonary Rehab  Date 12/02/20  Educator AS  Instruction Review Code 1- Verbalizes Understanding       Education: Sleep Hygiene -Provides group verbal and written instruction about how sleep can affect your health.  Define sleep hygiene, discuss sleep cycles and impact of sleep habits. Review good sleep hygiene tips.    Initial Review & Psychosocial Screening:  Initial Psych Review & Screening - 10/02/20 1416       Initial Review   Current issues with Current Stress Concerns    Source of Stress Concerns Unable to perform yard/household activities;Unable to participate in former interests or hobbies      [place]? Yes   girlfriend     Barriers   Psychosocial barriers to participate in program There are no identifiable barriers or psychosocial needs.      Screening Interventions   Interventions Encouraged to exercise;Provide feedback about the scores to participant;To provide support and resources with identified psychosocial needs    Expected Outcomes Short Term goal: Utilizing psychosocial counselor, staff and physician to assist with identification of specific Stressors or current issues  interfering with healing process. Setting desired goal for each stressor or current issue identified.;Long Term Goal: Stressors or current issues are controlled or eliminated.;Short Term goal: Identification and review with participant of any Quality of Life or Depression concerns found by scoring the questionnaire.;Long Term goal: The participant improves quality of Life and PHQ9 Scores as seen by post scores and/or verbalization of changes             Quality of Life Scores:   Quality of Life - 10/08/20 1152       Quality of Life   Select Quality of Life      Quality of Life Scores   Health/Function Pre 20.5 %    Socioeconomic Pre 21.36 %    Psych/Spiritual Pre 22.93 %    Family Pre 22.13 %    GLOBAL Pre 21.42 %            Scores of 19 and below usually indicate a poorer quality of life in these areas.  A difference of  2-3 points is a clinically meaningful difference.  A difference of 2-3 points in the total score of the Quality of Life Index has been associated with significant improvement in overall quality of life, self-image, physical symptoms, and general health in studies assessing change in quality of life.  PHQ-9: Recent Review Flowsheet Data     Depression screen [facility] 2/9 10/08/2020   Decreased Interest 0   Down, Depressed, Hopeless 1   PHQ - 2 Score 1   Altered sleeping 0   Tired, decreased energy 1   Change in appetite 0   Feeling bad or failure about yourself  0   Trouble concentrating 0   Moving slowly or fidgety/restless 1   Suicidal thoughts 0   PHQ-9 Score 3   Difficult doing work/chores Somewhat difficult      Interpretation of Total Score  Total Score Depression Severity:  1-4 = Minimal depression, 5-9 = Mild depression, 10-14 = Moderate depression, 15-19 = Moderately severe depression, 20-27 = Severe depression   Psychosocial Evaluation and Intervention:  Psychosocial Evaluation - 10/02/20 1421       Psychosocial Evaluation & Interventions    Interventions Encouraged to exercise  with the program and follow exercise prescription    Comments Adam Carter states he is 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 He is on short term disability from work and doesn't know when he will return. His girlfriend has been very supportive through the healing process. All these medicines and doctor appointments are new to him so he is looking forward to feeling more confident in managing his healthcare. He was discharged with a LifeVest and hopes to have it removed in time. He is sleeping okay, but sometimes wakes up because of the rib pain post vfib arrest. He did quit smoking when he was admitted in the hospital and hasn't had many cravings to restart.    Expected Outcomes Short: attend cardiac rehab for education and exercise. Long: develop positive self care habits.    Continue Psychosocial Services  Follow up required by staff             Psychosocial Re-Evaluation:  Psychosocial Re-Evaluation     Roxana Name 10/26/20 1753 11/25/20 1743           Psychosocial Re-Evaluation   Current issues with Current Anxiety/Panic Current Anxiety/Panic      Comments Adam Carter is doing well. Thie biggest problem he has is panic attacks about his heart. His doctor told him today that he is able to take off his life vest, though he said he is anxious as he feels it is his "security blanket." He has low confidence with his ability to exercise and he plans to work on this. He is also anxious as he has not touched a cigarette since his heart event, we talked about taking walks or distracting himself to reduce the urge. We also talked about using [email or web address] to help with guided meditation or deep breathing which he is interested in trying. His doctor offered oral medication to take, though has declined to use it. 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 Informed him of some techniques such as eating mints, drinking water and deep breathing.       Expected Outcomes Short: Try [email or web address] for meditation/deep breathing Long: Continue to maintain positive attitude Short: work on anxiety while working. 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 Informed him of some techniques such as eating mints, drinking water and deep breathing.    Expected Outcomes Short: work on anxiety while working. Long: maintain anxiety independently    Interventions Encouraged to attend Cardiac Rehabilitation for the exercise    Continue Psychosocial Services  Follow up required by staff             Vocational Rehabilitation: Provide vocational rehab assistance to qualifying candidates.   Vocational Rehab Evaluation & Intervention:  Vocational Rehab - 10/02/20 1416       Initial Vocational Rehab Evaluation & Intervention   Assessment shows need for Vocational Rehabilitation No             Education: Education Goals: Education  classes will be provided on a variety of topics geared toward better understanding of heart health and risk factor modification. Participant will state understanding/return demonstration of topics presented as noted by education test scores.  Learning Barriers/Preferences:  Learning Barriers/Preferences - 10/02/20 1416       Learning Barriers/Preferences   Learning Barriers None    Learning Preferences None             General Cardiac Education Topics:  AED/CPR: - Group verbal and written instruction with the use of  models to demonstrate the basic use of the AED with the basic ABC's of resuscitation.   Anatomy and Cardiac Procedures: - Group verbal and visual presentation and models provide information about basic cardiac anatomy and function. Reviews the testing methods done to diagnose heart disease and the outcomes of the test results. Describes the treatment choices: Medical Management, Angioplasty, or Coronary Bypass Surgery for treating various heart conditions including Myocardial Infarction, Angina, Valve Disease, and Cardiac Arrhythmias.  Written material given at graduation. Flowsheet Row Cardiac Rehab from 12/02/2020 in [facility] Cardiac and Pulmonary Rehab  Date 10/21/20  Educator [facility]  Instruction Review Code 1- Verbalizes Understanding       Medication Safety: - Group verbal and visual instruction to review commonly prescribed medications for heart and lung disease. Reviews the medication, class of the drug, and side effects. Includes the steps to properly store meds and maintain the prescription regimen.  Written material given at graduation. Flowsheet Row Cardiac Rehab from 12/02/2020 in [facility] Cardiac and Pulmonary Rehab  Date 11/11/20  Educator [facility]  Instruction Review Code 1- Verbalizes Understanding       Intimacy: - Group verbal instruction through game format to discuss how heart and lung disease can affect sexual intimacy. Written material given at graduation.. Flowsheet Row Cardiac Rehab from 12/02/2020 in [facility] Cardiac and Pulmonary Rehab  Date 10/14/20  Educator [facility]  Instruction Review Code 1- Verbalizes Understanding       Know Your Numbers and Heart Failure: - Group verbal and visual instruction to discuss disease risk factors for cardiac and pulmonary disease and treatment options.  Reviews associated critical values for Overweight/Obesity, Hypertension, Cholesterol, and Diabetes.  Discusses basics of heart failure: signs/symptoms and treatments.  Introduces Heart Failure Zone chart  for action plan for heart failure.  Written material given at graduation. Flowsheet Row Cardiac Rehab from 12/02/2020 in [facility] Cardiac and Pulmonary Rehab  Date 11/18/20  Educator [facility]  Instruction Review Code 1- Verbalizes Understanding       Infection Prevention: - Provides verbal and written material to individual with discussion of infection control including proper hand washing and proper equipment cleaning during exercise session. Flowsheet Row Cardiac Rehab from 12/02/2020 in [facility] Cardiac and Pulmonary Rehab  Education need identified 10/08/20  Date 10/08/20  Educator [place]  Instruction Review Code 1- Verbalizes Understanding       Falls Prevention: - Provides verbal and written material to individual with discussion of falls prevention and safety. Flowsheet Row Cardiac Rehab from 12/02/2020 in [facility] Cardiac and Pulmonary Rehab  Education need identified 10/08/20  Date 10/08/20  Educator [place]  Instruction Review Code 1- Verbalizes Understanding       Other: -Provides group and verbal instruction on various topics (see comments)   Knowledge Questionnaire Score:  Knowledge Questionnaire Score - 10/08/20 5643       Knowledge Questionnaire Score   Pre Score 23/26: Heart Failure, Exercise, Nutrition  Core Components/Risk Factors/Patient Goals at Admission:  Personal Goals and Risk Factors at Admission - 10/08/20 1148       Core Components/Risk Factors/Patient Goals on Admission    Weight Management Yes;Weight Loss    Intervention Weight Management: Develop a combined nutrition and exercise program designed to reach desired caloric intake, while maintaining appropriate intake of nutrient and fiber, sodium and fats, and appropriate energy expenditure required for the weight goal.;Weight Management: Provide education and appropriate resources to help participant work on and attain dietary goals.;Weight Management/Obesity: Establish reasonable short term and long term  weight goals.    Admit Weight 178 lb (80.7 kg)    Goal Weight: Short Term 174 lb (78.9 kg)    Goal Weight: Long Term 168 lb (76.2 kg)    Expected Outcomes Short Term: Continue to assess and modify interventions until short term weight is achieved;Long Term: Adherence to nutrition and physical activity/exercise program aimed toward attainment of established weight goal;Weight Loss: Understanding of general recommendations for a balanced deficit meal plan, which promotes 1-2 lb weight loss per week and includes a negative energy balance of [phone] kcal/d;Understanding recommendations for meals to include 15-35% energy as protein, 25-35% energy from fat, 35-60% energy from carbohydrates, less than 267m of dietary cholesterol, 20-35 gm of total fiber daily;Understanding of distribution of calorie intake throughout the day with the consumption of 4-5 meals/snacks    Tobacco Cessation Yes   Quit 09/07/20   Intervention Assist the participant in steps to quit. Provide individualized education and counseling about committing to Tobacco Cessation, relapse prevention, and pharmacological support that can be provided by physician.;OAdvice worker assist with locating and accessing local/national Quit Smoking programs, and support quit date choice.    Expected Outcomes Short Term: Will demonstrate readiness to quit, by selecting a quit date.;Short Term: Will quit all tobacco product use, adhering to prevention of relapse plan.;Long Term: Complete abstinence from all tobacco products for at least 12 months from quit date.    Heart Failure Yes    Intervention Provide a combined exercise and nutrition program that is supplemented with education, support and counseling about heart failure. Directed toward relieving symptoms such as shortness of breath, decreased exercise tolerance, and extremity edema.    Expected Outcomes Improve functional capacity of life;Short term: Attendance in program 2-3 days a  week with increased exercise capacity. Reported lower sodium intake. Reported increased fruit and vegetable intake. Reports medication compliance.;Short term: Daily weights obtained and reported for increase. Utilizing diuretic protocols set by physician.;Long term: Adoption of self-care skills and reduction of barriers for early signs and symptoms recognition and intervention leading to self-care maintenance.    Lipids Yes    Intervention Provide education and support for participant on nutrition & aerobic/resistive exercise along with prescribed medications to achieve LDL <71m HDL >4061m   Expected Outcomes Short Term: Participant states understanding of desired cholesterol values and is compliant with medications prescribed. Participant is following exercise prescription and nutrition guidelines.;Long Term: Cholesterol controlled with medications as prescribed, with individualized exercise RX and with personalized nutrition plan. Value goals: LDL < 32m29mDL > 40 mg.             Education:Diabetes - Individual verbal and written instruction to review signs/symptoms of diabetes, desired ranges of glucose level fasting, after meals and with exercise. Acknowledge that pre and post exercise glucose checks will be done for 3 sessions at entry of program.   Core Components/Risk Factors/Patient Goals Review:   Goals and  Risk Factor Review     Row Name 10/26/20 1745 11/25/20 1734           Core Components/Risk Factors/Patient Goals Review   Personal Goals Review Tobacco Cessation;Weight Management/Obesity;Heart Failure;Hypertension Tobacco Cessation      Review Adam Carter is doing well. Ejection has gone up 10% and his doctor told him today he is able to take off his life vest! He has anxiety taking off his left vest. He denies any symptoms with heart failure. Weight has been pretty stable, denies any significant weight gain. BP have been great, sometimes he runs low, which he does not get  symptomatic with. He is aware to stay hydrated. He has continued to stop smoking and applauded for his efforts. It is hard for him and he get eager to pick up a cigarette sometimes, but has a plan to refer to and takes walks when he feels the urge. Adam Carter has been deep breathing, snacking and walking to help him curve is cravings for cigaretts. He has not smoked since March and plans to keep it that way. He states that the fact that he had a heart attack is reason enough to stay away from them.      Expected Outcomes Long: Continue smoking cessation Long: Continue to manage lifestyle risk factors Short: use techniques to stay away from cigaretts. Long: remain tobacco free.               Core Components/Risk Factors/Patient Goals at Discharge (Final Review):   Goals and Risk Factor Review - 11/25/20 1734       Core Components/Risk Factors/Patient Goals Review   Personal Goals Review Tobacco Cessation    Review Adam Carter has been deep breathing, snacking and walking to help him curve is cravings for cigaretts. He has not smoked since March and plans to keep it that way. He states that the fact that he had a heart attack is reason enough to stay away from them.    Expected Outcomes Short: use techniques to stay away from cigaretts. Long: remain tobacco free.             ITP Comments:  ITP Comments     Row Name 10/02/20 1424 10/08/20 0940 10/08/20 0941 10/12/20 1725 10/14/20 0945   ITP Comments Initial telephone orientation completed. Diagnosis can be found in [facility] 3/14. EP orientation scheduled for 4/14 at [place] has recently quit tobacco use within the last 6 months. Quit date was 09/07/20 after his heart event. Intervention for relapse prevention was provided at the initial medical review. He was encouraged to continue to with tobacco cessation and was provided information on relapse prevention. Patient received information about combination therapy, tobacco cessation classes, quit line, and  quit smoking apps in case of a relapse. Patient demonstrated understanding of this material.Staff will continue to provide encouragement and follow up with the patient throughout the program. Completed 6MWT and gym orientation. Initial ITP created and sent for review to Dr. Emily Filbert, Medical Director. First full day of exercise!  Patient was oriented to gym and equipment including functions, settings, policies, and procedures.  Patient's individual exercise prescription and treatment plan were reviewed.  All starting workloads were established based on the results of the 6 minute walk test done at initial orientation visit.  The plan for exercise progression was also introduced and progression will be customized based on patient's performance and goals. 30 Day review completed. Medical Director ITP review done, changes made as directed, and signed approval  by Medical Director.   new to program    Row Name 11/11/20 0641 12/09/20 0718         ITP Comments 30 Day review completed. Medical Director ITP review done, changes made as directed, and signed approval by Medical Director. 30 Day review completed. Medical Director ITP review done, changes made as directed, and signed approval by Medical Director.               Comments:

## 2020-12-09 NOTE — Telephone Encounter (Signed)
Approved on June 8 Request Reference Number: [id number].  ENTRESTO TAB 24-26MG  is approved through 12/02/2021.  Your patient may now fill this prescription and it will be covered.

## 2020-12-09 NOTE — Progress Notes (Signed)
Daily Session Note  Patient Details  Name: Adam Carter MRN: [id number] Date of Birth: 1975-07-20 Referring Provider:   Flowsheet Row Cardiac Rehab from 10/08/2020 in [facility] Cardiac and Pulmonary Rehab  Referring Provider Kathlyn Sacramento MD       Encounter Date: 12/09/2020  Check In:  Session Check In - 12/09/20 [place]       Check-In   Supervising physician immediately available to respond to emergencies See telemetry face sheet for immediately available ER MD    Location [facility]-Cardiac & Pulmonary Rehab    Staff Present Renita Papa, RN Margurite Auerbach, MS, ASCM CEP, Exercise Physiologist;Joseph Tessie Fass RCP,RRT,BSRT    Virtual Visit No    Medication changes reported     No    Fall or balance concerns reported    No    Warm-up and Cool-down Performed on first and last piece of equipment    Resistance Training Performed Yes    VAD Patient? No    PAD/SET Patient? No      Pain Assessment   Currently in Pain? No/denies                Social History   Tobacco Use  Smoking Status Former   Pack years: 0.00   Types: Cigarettes   Quit date: 09/09/2020   Years since quitting: 0.2  Smokeless Tobacco Never    Goals Met:  Independence with exercise equipment Exercise tolerated well No report of cardiac concerns or symptoms Strength training completed today  Goals Unmet:  Not Applicable  Comments: Pt able to follow exercise prescription today without complaint.  Will continue to monitor for progression.'   Dr. Emily Filbert is Medical Director for [place].  Dr. Ottie Glazier is Medical Director for [facility] Pulmonary Rehabilitation.

## 2020-12-10 ENCOUNTER — Encounter: Payer: No Typology Code available for payment source | Admitting: *Deleted

## 2020-12-10 ENCOUNTER — Other Ambulatory Visit: Payer: Self-pay

## 2020-12-10 VITALS — Ht 67.75 in | Wt 167.2 lb

## 2020-12-10 DIAGNOSIS — I213 ST elevation (STEMI) myocardial infarction of unspecified site: Secondary | ICD-10-CM

## 2020-12-10 DIAGNOSIS — Z955 Presence of coronary angioplasty implant and graft: Secondary | ICD-10-CM

## 2020-12-10 NOTE — Progress Notes (Signed)
Daily Session Note  Patient Details  Name: Marlon Suleiman MRN: [id number] Date of Birth: 1976/05/08 Referring Provider:   Flowsheet Row Cardiac Rehab from 10/08/2020 in [facility] Cardiac and Pulmonary Rehab  Referring Provider Kathlyn Sacramento MD       Encounter Date: 12/10/2020  Check In:  Session Check In - 12/10/20 [place]       Check-In   Supervising physician immediately available to respond to emergencies See telemetry face sheet for immediately available ER MD    Location [facility]-Cardiac & Pulmonary Rehab    Staff Present Renita Papa, RN BSN;Melissa Caiola RDN, LDN;Joseph Lou Miner, MS, ASCM CEP, Exercise Physiologist    Virtual Visit No    Medication changes reported     No    Fall or balance concerns reported    No    Warm-up and Cool-down Performed on first and last piece of equipment    Resistance Training Performed Yes    VAD Patient? No    PAD/SET Patient? No      Pain Assessment   Currently in Pain? No/denies                Social History   Tobacco Use  Smoking Status Former   Pack years: 0.00   Types: Cigarettes   Quit date: 09/09/2020   Years since quitting: 0.2  Smokeless Tobacco Never    Goals Met:  Independence with exercise equipment Exercise tolerated well No report of cardiac concerns or symptoms Strength training completed today  Goals Unmet:  Not Applicable  Comments: Pt able to follow exercise prescription today without complaint.  Will continue to monitor for progression.    Dr. Emily Filbert is Medical Director for [place].  Dr. Ottie Glazier is Medical Director for [facility] Pulmonary Rehabilitation.

## 2020-12-16 ENCOUNTER — Other Ambulatory Visit: Payer: Self-pay

## 2020-12-16 DIAGNOSIS — I213 ST elevation (STEMI) myocardial infarction of unspecified site: Secondary | ICD-10-CM

## 2020-12-16 DIAGNOSIS — Z955 Presence of coronary angioplasty implant and graft: Secondary | ICD-10-CM

## 2020-12-16 NOTE — Progress Notes (Signed)
Daily Session Note  Patient Details  Name: Tasean Mancha MRN: [id number] Date of Birth: Jul 28, 1975 Referring Provider:   Flowsheet Row Cardiac Rehab from 10/08/2020 in [facility] Cardiac and Pulmonary Rehab  Referring Provider Kathlyn Sacramento MD       Encounter Date: 12/16/2020  Check In:  Session Check In - 12/16/20 1724       Check-In   Supervising physician immediately available to respond to emergencies See telemetry face sheet for immediately available ER MD    Location [facility]-Cardiac & Pulmonary Rehab    Staff Present Birdie Sons, MPA, RN;Melissa Caiola RDN, LDN;Joseph Lou Miner, MS, ASCM CEP, Exercise Physiologist    Virtual Visit No    Medication changes reported     No    Fall or balance concerns reported    No    Warm-up and Cool-down Performed on first and last piece of equipment    Resistance Training Performed Yes    VAD Patient? No    PAD/SET Patient? No      Pain Assessment   Currently in Pain? No/denies                Social History   Tobacco Use  Smoking Status Former   Pack years: 0.00   Types: Cigarettes   Quit date: 09/09/2020   Years since quitting: 0.2  Smokeless Tobacco Never    Goals Met:  Independence with exercise equipment Exercise tolerated well No report of cardiac concerns or symptoms Strength training completed today  Goals Unmet:  Not Applicable  Comments: Pt able to follow exercise prescription today without complaint.  Will continue to monitor for progression.    Dr. Emily Filbert is Medical Director for [place].  Dr. Ottie Glazier is Medical Director for [facility] Pulmonary Rehabilitation.

## 2020-12-17 ENCOUNTER — Other Ambulatory Visit: Payer: Self-pay

## 2020-12-17 ENCOUNTER — Encounter: Payer: No Typology Code available for payment source | Admitting: *Deleted

## 2020-12-17 DIAGNOSIS — Z955 Presence of coronary angioplasty implant and graft: Secondary | ICD-10-CM

## 2020-12-17 DIAGNOSIS — I213 ST elevation (STEMI) myocardial infarction of unspecified site: Secondary | ICD-10-CM | POA: Diagnosis not present

## 2020-12-17 NOTE — Progress Notes (Signed)
Daily Session Note  Patient Details  Name: Adam Carter MRN: [id number] Date of Birth: April 21, 1976 Referring Provider:   Flowsheet Row Cardiac Rehab from 10/08/2020 in [facility] Cardiac and Pulmonary Rehab  Referring Provider Kathlyn Sacramento MD       Encounter Date: 12/17/2020  Check In:  Session Check In - 12/17/20 1701       Check-In   Supervising physician immediately available to respond to emergencies See telemetry face sheet for immediately available ER MD    Location [facility]-Cardiac & Pulmonary Rehab    Staff Present Renita Papa, RN BSN;Joseph Foy [place], [place], ACSM CEP, Exercise Physiologist    Virtual Visit No    Medication changes reported     No    Warm-up and Cool-down Performed on first and last piece of equipment    Resistance Training Performed Yes    VAD Patient? No    PAD/SET Patient? No      Pain Assessment   Currently in Pain? No/denies                Social History   Tobacco Use  Smoking Status Former   Pack years: 0.00   Types: Cigarettes   Quit date: 09/09/2020   Years since quitting: 0.2  Smokeless Tobacco Never    Goals Met:  Independence with exercise equipment Exercise tolerated well No report of cardiac concerns or symptoms Strength training completed today  Goals Unmet:  Not Applicable  Comments: Pt able to follow exercise prescription today without complaint.  Will continue to monitor for progression.    Dr. Emily Filbert is Medical Director for [place].  Dr. Ottie Glazier is Medical Director for [facility] Pulmonary Rehabilitation.

## 2020-12-21 ENCOUNTER — Other Ambulatory Visit: Payer: Self-pay

## 2020-12-21 ENCOUNTER — Encounter: Payer: No Typology Code available for payment source | Admitting: *Deleted

## 2020-12-21 DIAGNOSIS — Z955 Presence of coronary angioplasty implant and graft: Secondary | ICD-10-CM

## 2020-12-21 DIAGNOSIS — I213 ST elevation (STEMI) myocardial infarction of unspecified site: Secondary | ICD-10-CM

## 2020-12-21 NOTE — Patient Instructions (Addendum)
Discharge Patient Instructions  Patient Details  Name: Adam Carter MRN: [id number] Date of Birth: 08/31/1975 Referring Provider:  Iran Ouch, MD   Number of Visits: 35  Reason for Discharge:  Patient reached a stable level of exercise.  Smoking History:  Social History   Tobacco Use  Smoking Status Former   Pack years: 0.00   Types: Cigarettes   Quit date: 09/09/2020   Years since quitting: 0.2  Smokeless Tobacco Never    Diagnosis:  ST elevation myocardial infarction (STEMI), unspecified artery (HCC)  Status post coronary artery stent placement  Initial Exercise Prescription:  Initial Exercise Prescription - 10/08/20 1100       Date of Initial Exercise RX and Referring Provider   Date 10/08/20    Referring Provider Lorine Bears MD      Treadmill   MPH 2.8    Grade 2    Minutes 15    METs 3.9      NuStep   Level 4    SPM 80    Minutes 15    METs 4.5      Recumbant Elliptical   Level 3    RPM 50    Minutes 15    METs 4.5      REL-XR   Level 4    Speed 50    Minutes 15    METs 4.5      T5 Nustep   Level 3    SPM 80    Minutes 15    METs 4.5      Prescription Details   Frequency (times per week) 3    Duration Progress to 30 minutes of continuous aerobic without signs/symptoms of physical distress      Intensity   THRR 40-80% of Max Heartrate 111-155    Ratings of Perceived Exertion 11-13    Perceived Dyspnea 0-4      Progression   Progression Continue to progress workloads to maintain intensity without signs/symptoms of physical distress.      Resistance Training   Training Prescription Yes    Weight 5 lb    Reps 10-15             Discharge Exercise Prescription (Final Exercise Prescription Changes):  Exercise Prescription Changes - 12/10/20 1100       Response to Exercise   Blood Pressure (Admit) 100/60    Blood Pressure (Exercise) 128/68    Blood Pressure (Exit) 102/58    Heart Rate (Admit) 67 bpm    Heart Rate  (Exercise) 118 bpm    Heart Rate (Exit) 68 bpm    Rating of Perceived Exertion (Exercise) 15    Symptoms none    Duration Continue with 30 min of aerobic exercise without signs/symptoms of physical distress.    Intensity THRR unchanged      Progression   Progression Continue to progress workloads to maintain intensity without signs/symptoms of physical distress.    Average METs 6.85      Resistance Training   Training Prescription Yes    Weight 4 lb    Reps 10-15      Interval Training   Interval Training No      Treadmill   MPH 4    Grade 5    Minutes 15    METs 6.8      NuStep   Level 8    Minutes 15    METs 6.9      Home Exercise Plan   Plans to  continue exercise at Home (comment)   walking   Frequency Add 2 additional days to program exercise sessions.    Initial Home Exercises Provided 10/26/20             Functional Capacity:  6 Minute Walk     Row Name 10/08/20 1419 12/10/20 1737       6 Minute Walk   Phase Initial Discharge    Distance 1465 feet 1865 feet    Walk Time 6 minutes 6 minutes    # of Rest Breaks 0 0    MPH 2.77 3.53    METS 4.54 5.36    RPE 7 9    Perceived Dyspnea  1 0    VO2 Peak 15.91 18.76    Symptoms No No    Resting HR 68 bpm 62 bpm    Resting BP 106/68 98/58    Resting Oxygen Saturation  96 % 97 %    Exercise Oxygen Saturation  during 6 min walk 97 % 96 %    Max Ex. HR 97 bpm 101 bpm    Max Ex. BP 116/68 114/64    2 Minute Post BP 108/70 --               Nutrition & Weight - Outcomes:  Pre Biometrics - 10/08/20 0932       Pre Biometrics   Height 5' 7.75" (1.721 m)    Weight 178 lb (80.7 kg)    BMI (Calculated) 27.26    Single Leg Stand 30 seconds             Post Biometrics - 12/10/20 1736        Post  Biometrics   Height 5' 7.75" (1.721 m)    Weight 167 lb 3.2 oz (75.8 kg)    BMI (Calculated) 25.61    Single Leg Stand 30 seconds             Nutrition:  Nutrition Therapy & Goals -  10/28/20 1614       Nutrition Therapy   Diet Heart healthy, Low Na    Drug/Food Interactions Statins/Certain Fruits    Protein (specify units) 60g    Fiber 30 grams    Whole Grain Foods 3 servings    Saturated Fats 12 max. grams    Fruits and Vegetables 8 servings/day    Sodium 1.5 grams      Personal Nutrition Goals   Nutrition Goal ST: increase variety of vegetables (all colors in a week at least), try to prep more in advance like bean salad  LT: get a larger variety of plant foods, meet fiber needs 30g    Comments Does not have sweets. Will have some cheerios as a snack. Will have water and small pepsi B: eggs (2) - 2 slices of bread (white wheat - b/c of girlfriends or whole grain) L: PB and J sandwich D: salmon, tuna steak, with vegetables  (mixes it up). Will sometimes have hamburger (lean), chili beans, [place]. Discussed heart healthy eaitng and planning meals - he feels that it can get hectic with cooking recipes every night.      Intervention Plan   Intervention Prescribe, educate and counsel regarding individualized specific dietary modifications aiming towards targeted core components such as weight, hypertension, lipid management, diabetes, heart failure and other comorbidities.;Nutrition handout(s) given to patient.    Expected Outcomes Short Term Goal: Understand basic principles of dietary content, such as calories, fat, sodium, cholesterol and nutrients.;Short Term  Goal: A plan has been developed with personal nutrition goals set during dietitian appointment.;Long Term Goal: Adherence to prescribed nutrition plan.               Goals reviewed with patient; copy given to patient.

## 2020-12-21 NOTE — Progress Notes (Signed)
Cardiac Individual Treatment Plan  Patient Details  Name: Adam Carter MRN: [id number] Date of Birth: [birth year] Referring Provider:   Flowsheet Row Cardiac Rehab from 10/08/2020 in [facility] Cardiac and Pulmonary Rehab  Referring Provider Kathlyn Sacramento MD       Initial Encounter Date:  Flowsheet Row Cardiac Rehab from 10/08/2020 in [facility] Cardiac and Pulmonary Rehab  Date 10/08/20       Visit Diagnosis: ST elevation myocardial infarction (STEMI), unspecified artery [facility])  Status post coronary artery stent placement  Patient's Home Medications on Admission:  Current Outpatient Medications:    aspirin 81 MG chewable tablet, Chew 1 tablet (81 mg total) by mouth daily., Disp: 30 tablet, Rfl: 2   atorvastatin (LIPITOR) 80 MG tablet, Take 1 tablet (80 mg total) by mouth daily at 6 PM., Disp: 30 tablet, Rfl: 3   carvedilol (COREG) 3.125 MG tablet, Take 1 tablet (3.125 mg total) by mouth 2 (two) times daily., Disp: 180 tablet, Rfl: 3   furosemide (LASIX) 20 MG tablet, Take 1 tablet (20 mg total) by mouth daily as needed., Disp: 90 tablet, Rfl: 3   Multiple Vitamin (MULTIVITAMIN WITH MINERALS) TABS tablet, Take 1 tablet by mouth daily., Disp: 30 tablet, Rfl: 0   pantoprazole (PROTONIX) 20 MG tablet, Take 1 tablet (20 mg total) by mouth daily., Disp: 30 tablet, Rfl: 5   potassium chloride (KLOR-CON) 10 MEQ tablet, Take 1 tablet twice a day for 3 days. Then take 1 tablet daily as needed when taking Lasix. (Patient taking differently: Take 1 tablet daily as needed when taking Lasix.), Disp: 30 tablet, Rfl: 3   sacubitril-valsartan (ENTRESTO) 24-26 MG, Take 1 tablet by mouth 2 (two) times daily., Disp: 60 tablet, Rfl: 3   ticagrelor (BRILINTA) 90 MG TABS tablet, Take 1 tablet (90 mg total) by mouth 2 (two) times daily., Disp: 60 tablet, Rfl: 3  Past Medical History: Past Medical History:  Diagnosis Date   CHF (congestive heart failure) ([place])    Coronary artery disease    occluded ostial LAD with  otherwise no obstructive disease.   PCI/DES to ostial LAD   HFrEF (heart failure with reduced ejection fraction) (HCC)    EF 30-35%   History of cardiac arrest    VF arrest 3/14   History of ST elevation myocardial infarction (STEMI)    09/07/20 anteriolateral STEMI   History of tobacco use    History of ventricular fibrillation    3/14   Hyperlipidemia LDL goal <70     Tobacco Use: Social History   Tobacco Use  Smoking Status Former   Pack years: 0.00   Types: Cigarettes   Quit date: 09/09/2020   Years since quitting: 0.2  Smokeless Tobacco Never    Labs: Recent Review Flowsheet Data     Labs for ITP Cardiac and Pulmonary Rehab Latest Ref Rng & Units 09/08/2020 09/09/2020 09/11/2020 09/16/2020 11/30/2020   Cholestrol 0 - 200 mg/dL - 126 - 117 87   LDLCALC 0 - 99 mg/dL - 75 - 70 40   HDL >40 mg/dL - 34(L) - 26(L) 27(L)   Trlycerides <150 mg/dL 133 86 176(H) 106 99   Hemoglobin A1c 4.8 - 5.6 % 6.0(H) - - - -   PHART 7.350 - 7.450 7.33(L) - - - -   PCO2ART 32.0 - 48.0 mmHg 43 - - - -   HCO3 20.0 - 28.0 mmol/L 22.7 - - - -   ACIDBASEDEF 0.0 - 2.0 mmol/L 3.2(H) - - - -  O2SAT % 98.7 - 67.7 - -        Exercise Target Goals: Exercise Program Goal: Individual exercise prescription set using results from initial 6 min walk test and THRR while considering  patient's activity barriers and safety.   Exercise Prescription Goal: Initial exercise prescription builds to 30-45 minutes a day of aerobic activity, 2-3 days per week.  Home exercise guidelines will be given to patient during program as part of exercise prescription that the participant will acknowledge.   Education: Aerobic Exercise: - Group verbal and visual presentation on the components of exercise prescription. Introduces F.I.T.T principle from ACSM for exercise prescriptions.  Reviews F.I.T.T. principles of aerobic exercise including progression. Written material given at graduation. Flowsheet Row Cardiac Rehab from  12/16/2020 in [facility] Cardiac and Pulmonary Rehab  Date 12/16/20  Educator AS  Instruction Review Code 1- Verbalizes Understanding       Education: Resistance Exercise: - Group verbal and visual presentation on the components of exercise prescription. Introduces F.I.T.T principle from ACSM for exercise prescriptions  Reviews F.I.T.T. principles of resistance exercise including progression. Written material given at graduation. Flowsheet Row Cardiac Rehab from 12/16/2020 in [facility] Cardiac and Pulmonary Rehab  Date 10/21/20  Educator AS  Instruction Review Code 1- Verbalizes Understanding        Education: Exercise & Equipment Safety: - Individual verbal instruction and demonstration of equipment use and safety with use of the equipment. Flowsheet Row Cardiac Rehab from 12/16/2020 in [facility] Cardiac and Pulmonary Rehab  Education need identified 10/08/20  Date 10/08/20  Educator [place]  Instruction Review Code 1- Verbalizes Understanding       Education: Exercise Physiology & General Exercise Guidelines: - Group verbal and written instruction with models to review the exercise physiology of the cardiovascular system and associated critical values. Provides general exercise guidelines with specific guidelines to those with heart or lung disease.  Flowsheet Row Cardiac Rehab from 12/16/2020 in [facility] Cardiac and Pulmonary Rehab  Date 12/09/20  Educator AS  Instruction Review Code 1- Verbalizes Understanding       Education: Flexibility, Balance, Mind/Body Relaxation: - Group verbal and visual presentation with interactive activity on the components of exercise prescription. Introduces F.I.T.T principle from ACSM for exercise prescriptions. Reviews F.I.T.T. principles of flexibility and balance exercise training including progression. Also discusses the mind body connection.  Reviews various relaxation techniques to help reduce and manage stress (i.e. Deep breathing, progressive muscle relaxation,  and visualization). Balance handout provided to take home. Written material given at graduation. Flowsheet Row Cardiac Rehab from 12/16/2020 in [facility] Cardiac and Pulmonary Rehab  Date 10/28/20  Educator AS  Instruction Review Code 1- Verbalizes Understanding       Activity Barriers & Risk Stratification:  Activity Barriers & Cardiac Risk Stratification - 10/08/20 0932       Activity Barriers & Cardiac Risk Stratification   Activity Barriers Neck/Spine Problems;Back Problems;Other (comment)    Comments Rib pain from chest compressions    Cardiac Risk Stratification High             6 Minute Walk:  6 Minute Walk     Row Name 10/08/20 1419 12/10/20 1737       6 Minute Walk   Phase Initial Discharge    Distance 1465 feet 1865 feet    Walk Time 6 minutes 6 minutes    # of Rest Breaks 0 0    MPH 2.77 3.53    METS 4.54 5.36    RPE 7 9  Perceived Dyspnea  1 0    VO2 Peak 15.91 18.76    Symptoms No No    Resting HR 68 bpm 62 bpm    Resting BP 106/68 98/58    Resting Oxygen Saturation  96 % 97 %    Exercise Oxygen Saturation  during 6 min walk 97 % 96 %    Max Ex. HR 97 bpm 101 bpm    Max Ex. BP 116/68 114/64    2 Minute Post BP 108/70 --             Oxygen Initial Assessment:   Oxygen Re-Evaluation:   Oxygen Discharge (Final Oxygen Re-Evaluation):   Initial Exercise Prescription:  Initial Exercise Prescription - 10/08/20 1100       Date of Initial Exercise RX and Referring Provider   Date 10/08/20    Referring Provider Kathlyn Sacramento MD      Treadmill   MPH 2.8    Grade 2    Minutes 15    METs 3.9      NuStep   Level 4    SPM 80    Minutes 15    METs 4.5      Recumbant Elliptical   Level 3    RPM 50    Minutes 15    METs 4.5      REL-XR   Level 4    Speed 50    Minutes 15    METs 4.5      T5 Nustep   Level 3    SPM 80    Minutes 15    METs 4.5      Prescription Details   Frequency (times per week) 3    Duration Progress  to 30 minutes of continuous aerobic without signs/symptoms of physical distress      Intensity   THRR 40-80% of Max Heartrate 111-155    Ratings of Perceived Exertion 11-13    Perceived Dyspnea 0-4      Progression   Progression Continue to progress workloads to maintain intensity without signs/symptoms of physical distress.      Resistance Training   Training Prescription Yes    Weight 5 lb    Reps 10-15             Perform Capillary Blood Glucose checks as needed.  Exercise Prescription Changes:   Exercise Prescription Changes     Row Name 10/08/20 1100 10/15/20 1500 10/28/20 1300 11/12/20 1600 11/25/20 1300     Response to Exercise   Blood Pressure (Admit) 106/68 100/62 110/60 92/64 106/58   Blood Pressure (Exercise) 116/68 130/64 124/70 122/68 108/58   Blood Pressure (Exit) 108/70 98/64 94/62  90/60 100/56   Heart Rate (Admit) 68 bpm 67 bpm 77 bpm 76 bpm 61 bpm   Heart Rate (Exercise) 97 bpm 129 bpm 124 bpm 137 bpm 105 bpm   Heart Rate (Exit) 66 bpm 99 bpm 84 bpm 92 bpm 81 bpm   Oxygen Saturation (Admit) 96 % -- -- -- --   Oxygen Saturation (Exercise) 97 % -- -- -- --   Oxygen Saturation (Exit) 97 % -- -- -- --   Rating of Perceived Exertion (Exercise) 7 13 12 13 13    Perceived Dyspnea (Exercise) 1 -- -- -- --   Symptoms none none none none none   Comments walk test results 2nd full day exercise -- -- --   Duration -- -- -- Continue with 30 min of aerobic exercise without signs/symptoms of physical  distress. Continue with 30 min of aerobic exercise without signs/symptoms of physical distress.   Intensity -- -- -- THRR unchanged THRR unchanged     Progression   Progression -- -- -- Continue to progress workloads to maintain intensity without signs/symptoms of physical distress. Continue to progress workloads to maintain intensity without signs/symptoms of physical distress.   Average METs -- -- -- 5.39 5.37     Resistance Training   Training Prescription -- Yes  Yes Yes Yes   Weight -- 5 lb 5 lb 4 lb 4 lb   Reps -- 10-15 10-15 10-15 10-15     Interval Training   Interval Training -- -- -- No No     Treadmill   MPH -- 2.8 3 3.7 3.6   Grade -- 2 2 4 4    Minutes -- 15 15 15 15    METs -- 3.9 4.2 5.87 5.74     NuStep   Level -- -- 5 7 --   Minutes -- -- 15 15 --   METs -- -- 4.12 5.3 --     Recumbant Elliptical   Level -- 3 -- -- --   RPM -- 50 -- -- --   Minutes -- 15 -- -- --   METs -- 4.5 -- -- --     REL-XR   Level -- 5 -- -- --   Speed -- 50 -- -- --   Minutes -- 15 -- -- --   METs -- 3.9 -- -- --     Biostep-RELP   Level -- -- -- 8 6   Minutes -- -- -- 15 15   METs -- -- -- 5 5     Home Exercise Plan   Plans to continue exercise at -- -- -- Home (comment)  walking --   Frequency -- -- -- Add 2 additional days to program exercise sessions. --   Initial Home Exercises Provided -- -- -- 10/26/20 --    [place] Name 12/10/20 1100             Response to Exercise   Blood Pressure (Admit) 100/60       Blood Pressure (Exercise) 128/68       Blood Pressure (Exit) 102/58       Heart Rate (Admit) 67 bpm       Heart Rate (Exercise) 118 bpm       Heart Rate (Exit) 68 bpm       Rating of Perceived Exertion (Exercise) 15       Symptoms none       Duration Continue with 30 min of aerobic exercise without signs/symptoms of physical distress.       Intensity THRR unchanged               Progression     Progression Continue to progress workloads to maintain intensity without signs/symptoms of physical distress.       Average METs 6.85               Resistance Training     Training Prescription Yes       Weight 4 lb       Reps 10-15               Interval Training     Interval Training No               Treadmill     MPH 4       Grade 5  Minutes 15       METs 6.8               NuStep     Level 8       Minutes 15       METs 6.9               Home Exercise Plan     Plans to continue exercise at Home  (comment)  walking       Frequency Add 2 additional days to program exercise sessions.       Initial Home Exercises Provided 10/26/20               Exercise Comments:   Exercise Goals and Review:   Exercise Goals     Row Name 10/08/20 1147             Exercise Goals   Increase Physical Activity Yes       Intervention Provide advice, education, support and counseling about physical activity/exercise needs.;Develop an individualized exercise prescription for aerobic and resistive training based on initial evaluation findings, risk stratification, comorbidities and participant's personal goals.       Expected Outcomes Short Term: Attend rehab on a regular basis to increase amount of physical activity.;Long Term: Add in home exercise to make exercise part of routine and to increase amount of physical activity.;Long Term: Exercising regularly at least 3-5 days a week.       Increase Strength and Stamina Yes       Intervention Provide advice, education, support and counseling about physical activity/exercise needs.;Develop an individualized exercise prescription for aerobic and resistive training based on initial evaluation findings, risk stratification, comorbidities and participant's personal goals.       Expected Outcomes Short Term: Increase workloads from initial exercise prescription for resistance, speed, and METs.;Short Term: Perform resistance training exercises routinely during rehab and add in resistance training at home;Long Term: Improve cardiorespiratory fitness, muscular endurance and strength as measured by increased METs and functional capacity (6MWT)       Able to understand and use rate of perceived exertion (RPE) scale Yes       Intervention Provide education and explanation on how to use RPE scale       Expected Outcomes Short Term: Able to use RPE daily in rehab to express subjective intensity level;Long Term:  Able to use RPE to guide intensity level when exercising  independently       Able to understand and use Dyspnea scale Yes       Intervention Provide education and explanation on how to use Dyspnea scale       Expected Outcomes Short Term: Able to use Dyspnea scale daily in rehab to express subjective sense of shortness of breath during exertion;Long Term: Able to use Dyspnea scale to guide intensity level when exercising independently       Knowledge and understanding of Target Heart Rate Range (THRR) Yes       Intervention Provide education and explanation of THRR including how the numbers were predicted and where they are located for reference       Expected Outcomes Short Term: Able to state/look up THRR;Short Term: Able to use daily as guideline for intensity in rehab;Long Term: Able to use THRR to govern intensity when exercising independently       Able to check pulse independently Yes       Intervention Provide education and demonstration on how to check pulse in carotid and  radial arteries.;Review the importance of being able to check your own pulse for safety during independent exercise       Expected Outcomes Short Term: Able to explain why pulse checking is important during independent exercise;Long Term: Able to check pulse independently and accurately       Understanding of Exercise Prescription Yes       Intervention Provide education, explanation, and written materials on patient's individual exercise prescription       Expected Outcomes Short Term: Able to explain program exercise prescription;Long Term: Able to explain home exercise prescription to exercise independently                Exercise Goals Re-Evaluation :  Exercise Goals Re-Evaluation     Row Name 10/12/20 1726 10/15/20 1551 10/26/20 1741 11/12/20 1605 11/25/20 1306     Exercise Goal Re-Evaluation   Exercise Goals Review Able to understand and use rate of perceived exertion (RPE) scale;Increase Physical Activity;Knowledge and understanding of Target Heart Rate Range  (THRR);Understanding of Exercise Prescription;Increase Strength and Stamina;Able to check pulse independently Able to understand and use rate of perceived exertion (RPE) scale;Increase Physical Activity;Knowledge and understanding of Target Heart Rate Range (THRR);Understanding of Exercise Prescription;Increase Strength and Stamina;Able to check pulse independently Able to understand and use rate of perceived exertion (RPE) scale;Increase Physical Activity;Knowledge and understanding of Target Heart Rate Range (THRR);Understanding of Exercise Prescription;Increase Strength and Stamina;Able to check pulse independently Increase Physical Activity;Increase Strength and Stamina;Understanding of Exercise Prescription Increase Physical Activity;Increase Strength and Stamina   Comments Reviewed RPE and dyspnea scales, THR and program prescription with pt today.  Pt voiced understanding and was given a copy of goals to take home. Adam Carter is doing well for his first couple of exercise sessions at rehab. Carter is starting to get to know the routine a little bit better. Will continue to monitor. Reviewed home exercise with pt today.  Pt plans to walk for exercise.  Reviewed THR, pulse, RPE, sign and symptoms, pulse oximetery and when to call 911 or MD.  Also discussed weather considerations and indoor options.  Pt voiced understanding. Adam Carter has been doing well in rehab.  Carter is up to level 8 on BioStep.  We will continue to monitor his progress. Adam Carter attends consistently and works at [employer] and in [employer] range.  Staff will review interval training and monitor progress.   Expected Outcomes Short: Use RPE daily to regulate intensity. Long: Follow program prescription in THR. Short: Continue attendance with rehab Long: Exercise at home independently with appropriate exercise prescription Short: Add on 1 day of exercise at home, then add on 2nd day after Long: Exercise at home with confidence at appropriate measures Short: Increase  hand weights Long: Continue to improve stamina Short: try intervals Long: increase MET level    Row Name 12/10/20 1136             Exercise Goal Re-Evaluation   Exercise Goals Review Increase Physical Activity;Increase Strength and Stamina       Comments Adam Carter is progressing well.  Carter has increased to 4 mph and 5% incline on TM.  His average MET level is 6.85       Expected Outcomes Short: continue to attend consistently Long:  build overall stamina                Discharge Exercise Prescription (Final Exercise Prescription Changes):  Exercise Prescription Changes - 12/10/20 1100       Response to Exercise  Blood Pressure (Admit) 100/60    Blood Pressure (Exercise) 128/68    Blood Pressure (Exit) 102/58    Heart Rate (Admit) 67 bpm    Heart Rate (Exercise) 118 bpm    Heart Rate (Exit) 68 bpm    Rating of Perceived Exertion (Exercise) 15    Symptoms none    Duration Continue with 30 min of aerobic exercise without signs/symptoms of physical distress.    Intensity THRR unchanged      Progression   Progression Continue to progress workloads to maintain intensity without signs/symptoms of physical distress.    Average METs 6.85      Resistance Training   Training Prescription Yes    Weight 4 lb    Reps 10-15      Interval Training   Interval Training No      Treadmill   MPH 4    Grade 5    Minutes 15    METs 6.8      NuStep   Level 8    Minutes 15    METs 6.9      Home Exercise Plan   Plans to continue exercise at Home (comment)   walking   Frequency Add 2 additional days to program exercise sessions.    Initial Home Exercises Provided 10/26/20             Nutrition:  Target Goals: Understanding of nutrition guidelines, daily intake of sodium <1542m, cholesterol <2069m calories 30% from fat and 7% or less from saturated fats, daily to have 5 or more servings of fruits and vegetables.  Education: All About Nutrition: -Group instruction provided  by verbal, written material, interactive activities, discussions, models, and posters to present general guidelines for heart healthy nutrition including fat, fiber, MyPlate, the role of sodium in heart healthy nutrition, utilization of the nutrition label, and utilization of this knowledge for meal planning. Follow up email sent as well. Written material given at graduation. [facility]  Date 11/04/20  [facility] Review Code 1- Verbalizes Understanding       Biometrics:  Pre Biometrics - 10/08/20 0932       Pre Biometrics   Height 5' 7.75" (1.721 m)    Weight 178 lb (80.7 kg)    BMI (Calculated) 27.26    Single Leg Stand 30 seconds             Post Biometrics - 12/10/20 1736        Post  Biometrics   Height 5' 7.75" (1.721 m)    Weight 167 lb 3.2 oz (75.8 kg)    BMI (Calculated) 25.61    Single Leg Stand 30 seconds             Nutrition Therapy Plan and Nutrition Goals:  Nutrition Therapy & Goals - 10/28/20 1614       Nutrition Therapy   Diet Heart healthy, Low Na    Drug/Food Interactions Statins/Certain Fruits    Protein (specify units) 60g    Fiber 30 grams    Whole Grain Foods 3 servings    Saturated Fats 12 max. grams    Fruits and Vegetables 8 servings/day    Sodium 1.5 grams      Personal Nutrition Goals   Nutrition Goal ST: increase variety of vegetables (all colors in a week at least), try to prep more in advance like bean salad  LT: get a larger variety of  plant foods, meet fiber needs 30g    Comments Does not have sweets. Will have some cheerios as a snack. Will have water and small pepsi B: eggs (2) - 2 slices of bread (white wheat - b/c of girlfriends or whole grain) L: PB and J sandwich D: salmon, tuna steak, with vegetables  (mixes it up). Will sometimes have hamburger (lean), chili beans, [place]. Discussed heart healthy eaitng and planning meals - Carter feels that it  can get hectic with cooking recipes every night.      Intervention Plan   Intervention Prescribe, educate and counsel regarding individualized specific dietary modifications aiming towards targeted core components such as weight, hypertension, lipid management, diabetes, heart failure and other comorbidities.;Nutrition handout(s) given to patient.    Expected Outcomes Short Term Goal: Understand basic principles of dietary content, such as calories, fat, sodium, cholesterol and nutrients.;Short Term Goal: A plan has been developed with personal nutrition goals set during dietitian appointment.;Long Term Goal: Adherence to prescribed nutrition plan.             Nutrition Assessments:  MEDIFICTS Score Key: ?70 Need to make dietary changes  40-70 Heart Healthy Diet ? 40 Therapeutic Level Cholesterol Diet  Flowsheet Row Cardiac Rehab from 12/16/2020 in [facility] Cardiac and Pulmonary Rehab  Picture Your Plate Total Score on Admission 78  Picture Your Plate Total Score on Discharge 81      Picture Your Plate Scores: <92 Unhealthy dietary pattern with much room for improvement. 41-50 Dietary pattern unlikely to meet recommendations for good health and room for improvement. 51-60 More healthful dietary pattern, with some room for improvement.  >60 Healthy dietary pattern, although there may be some specific behaviors that could be improved.    Nutrition Goals Re-Evaluation:  Nutrition Goals Re-Evaluation     [place] Name 11/25/20 1739             Goals   Current Weight 171 lb (77.6 kg)       Nutrition Goal Eat less sodium.       Comment Adam Carter states Carter only eats red meat once a week. Carter eats chicken, fish and shrimp. Carter is working on eating vegetables. Carter wants to keep is sodium under 1568m per day.       Expected Outcome Short: look at nutrition labels for sodium. Long: maintain reduction in sodium intake.                Nutrition Goals Discharge (Final Nutrition Goals  Re-Evaluation):  Nutrition Goals Re-Evaluation - 11/25/20 1739       Goals   Current Weight 171 lb (77.6 kg)    Nutrition Goal Eat less sodium.    Comment Adam Carter Carter only eats red meat once a week. Carter eats chicken, fish and shrimp. Carter is working on eating vegetables. Carter wants to keep is sodium under 15036mper day.    Expected Outcome Short: look at nutrition labels for sodium. Long: maintain reduction in sodium intake.             Psychosocial: Target Goals: Acknowledge presence or absence of significant depression and/or stress, maximize coping skills, provide positive support system. Participant is able to verbalize types and ability to use techniques and skills needed for reducing stress and depression.   Education: Stress, Anxiety, and Depression - Group verbal and visual presentation to define topics covered.  Reviews how body is impacted by stress, anxiety, and depression.  Also discusses healthy ways to reduce stress and  to treat/manage anxiety and depression.  Written material given at graduation. Flowsheet Row Cardiac Rehab from 12/16/2020 in [facility] Cardiac and Pulmonary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

## 2020-12-21 NOTE — Progress Notes (Signed)
Discharge Progress Report  Patient Details  Name: Adam Carter MRN: [id number] Date of Birth: [birth year] Referring Provider:   Flowsheet Row Cardiac Rehab from 10/08/2020 in [facility]  Referring Provider Kathlyn Sacramento MD        Number of Visits: 36  Reason for Discharge:  Patient reached a stable level of exercise. Patient independent in their exercise. Patient has met program and personal goals.  Smoking History:  Social History   Tobacco Use  Smoking Status Former   Pack years: 0.00   Types: Cigarettes   Quit date: 09/09/2020   Years since quitting: 0.2  Smokeless Tobacco Never    Diagnosis:  ST elevation myocardial infarction (STEMI), unspecified artery (HCC)  Status post coronary artery stent placement  ADL [facility]:   Initial Exercise Prescription:  Initial Exercise Prescription - 10/08/20 1100       Date of Initial Exercise RX and Referring Provider   Date 10/08/20    Referring Provider Kathlyn Sacramento MD      Treadmill   MPH 2.8    Grade 2    Minutes 15    METs 3.9      NuStep   Level 4    SPM 80    Minutes 15    METs 4.5      Recumbant Elliptical   Level 3    RPM 50    Minutes 15    METs 4.5      REL-XR   Level 4    Speed 50    Minutes 15    METs 4.5      T5 Nustep   Level 3    SPM 80    Minutes 15    METs 4.5      Prescription Details   Frequency (times per week) 3    Duration Progress to 30 minutes of continuous aerobic without signs/symptoms of physical distress      Intensity   THRR 40-80% of Max Heartrate 111-155    Ratings of Perceived Exertion 11-13    Perceived Dyspnea 0-4      Progression   Progression Continue to progress workloads to maintain intensity without signs/symptoms of physical distress.      Resistance Training   Training Prescription Yes    Weight 5 lb    Reps 10-15             Discharge Exercise Prescription (Final Exercise Prescription Changes):  Exercise Prescription  Changes - 12/10/20 1100       Response to Exercise   Blood Pressure (Admit) 100/60    Blood Pressure (Exercise) 128/68    Blood Pressure (Exit) 102/58    Heart Rate (Admit) 67 bpm    Heart Rate (Exercise) 118 bpm    Heart Rate (Exit) 68 bpm    Rating of Perceived Exertion (Exercise) 15    Symptoms none    Duration Continue with 30 min of aerobic exercise without signs/symptoms of physical distress.    Intensity THRR unchanged      Progression   Progression Continue to progress workloads to maintain intensity without signs/symptoms of physical distress.    Average METs 6.85      Resistance Training   Training Prescription Yes    Weight 4 lb    Reps 10-15      Interval Training   Interval Training No      Treadmill   MPH 4    Grade 5    Minutes 15  METs 6.8      NuStep   Level 8    Minutes 15    METs 6.9      Home Exercise Plan   Plans to continue exercise at Home (comment)   walking   Frequency Add 2 additional days to program exercise sessions.    Initial Home Exercises Provided 10/26/20             Functional Capacity:  6 Minute Walk     Row Name 10/08/20 1419 12/10/20 1737       6 Minute Walk   Phase Initial Discharge    Distance 1465 feet 1865 feet    Walk Time 6 minutes 6 minutes    # of Rest Breaks 0 0    MPH 2.77 3.53    METS 4.54 5.36    RPE 7 9    Perceived Dyspnea  1 0    VO2 Peak 15.91 18.76    Symptoms No No    Resting HR 68 bpm 62 bpm    Resting BP 106/68 98/58    Resting Oxygen Saturation  96 % 97 %    Exercise Oxygen Saturation  during 6 min walk 97 % 96 %    Max Ex. HR 97 bpm 101 bpm    Max Ex. BP 116/68 114/64    2 Minute Post BP 108/70 --             Psychological, QOL, Others - Outcomes: PHQ 2/9: Depression screen [facility] 2/9 12/16/2020 10/08/2020  Decreased Interest 0 0  Down, Depressed, Hopeless 0 1  PHQ - 2 Score 0 1  Altered sleeping 1 0  Tired, decreased energy 1 1  Change in appetite 0 0  Feeling bad or  failure about yourself  0 0  Trouble concentrating 1 0  Moving slowly or fidgety/restless 2 1  Suicidal thoughts 0 0  PHQ-9 Score 5 3  Difficult doing work/chores Not difficult at all Somewhat difficult    Quality of Life:  Quality of Life - 12/16/20 1747       Quality of Life Scores   Health/Function Pre 20.5 %    Health/Function Post 26.2 %    Health/Function % Change 27.8 %    Socioeconomic Pre 21.36 %    Socioeconomic Post 24.06 %    Socioeconomic % Change  12.64 %    Psych/Spiritual Pre 22.93 %    Psych/Spiritual Post 25.14 %    Psych/Spiritual % Change 9.64 %    Family Pre 22.13 %    Family Post 26.4 %    Family % Change 19.3 %    GLOBAL Pre 21.42 %    GLOBAL Post 25.53 %    GLOBAL % Change 19.19 %             Nutrition & Weight - Outcomes:  Pre Biometrics - 10/08/20 0932       Pre Biometrics   Height 5' 7.75" (1.721 m)    Weight 178 lb (80.7 kg)    BMI (Calculated) 27.26    Single Leg Stand 30 seconds             Post Biometrics - 12/10/20 1736        Post  Biometrics   Height 5' 7.75" (1.721 m)    Weight 167 lb 3.2 oz (75.8 kg)    BMI (Calculated) 25.61    Single Leg Stand 30 seconds  Nutrition:  Nutrition Therapy & Goals - 10/28/20 1614       Nutrition Therapy   Diet Heart healthy, Low Na    Drug/Food Interactions Statins/Certain Fruits    Protein (specify units) 60g    Fiber 30 grams    Whole Grain Foods 3 servings    Saturated Fats 12 max. grams    Fruits and Vegetables 8 servings/day    Sodium 1.5 grams      Personal Nutrition Goals   Nutrition Goal ST: increase variety of vegetables (all colors in a week at least), try to prep more in advance like bean salad  LT: get a larger variety of plant foods, meet fiber needs 30g    Comments Does not have sweets. Will have some cheerios as a snack. Will have water and small pepsi B: eggs (2) - 2 slices of bread (white wheat - b/c of girlfriends or whole grain) L: PB and J  sandwich D: salmon, tuna steak, with vegetables  (mixes it up). Will sometimes have hamburger (lean), chili beans, [place]. Discussed heart healthy eaitng and planning meals - he feels that it can get hectic with cooking recipes every night.      Intervention Plan   Intervention Prescribe, educate and counsel regarding individualized specific dietary modifications aiming towards targeted core components such as weight, hypertension, lipid management, diabetes, heart failure and other comorbidities.;Nutrition handout(s) given to patient.    Expected Outcomes Short Term Goal: Understand basic principles of dietary content, such as calories, fat, sodium, cholesterol and nutrients.;Short Term Goal: A plan has been developed with personal nutrition goals set during dietitian appointment.;Long Term Goal: Adherence to prescribed nutrition plan.             Nutrition Discharge:   Education Questionnaire Score:  Knowledge Questionnaire Score - 12/16/20 1747       Knowledge Questionnaire Score   Pre Score 23/26: Heart Failure, Exercise, Nutrition    Post Score 26/26             Goals reviewed with patient; copy given to patient.

## 2020-12-21 NOTE — Progress Notes (Signed)
Daily Session Note  Patient Details  Name: Adam Carter MRN: [id number] Date of Birth: 1975-12-19 Referring Provider:   Flowsheet Row Cardiac Rehab from 10/08/2020 in [facility]  Referring Provider Kathlyn Sacramento MD       Encounter Date: 12/21/2020  Check In:  Session Check In - 12/21/20 1714       Check-In   Supervising physician immediately available to respond to emergencies See telemetry face sheet for immediately available ER MD    Location [facility]    Staff Present Renita Papa, RN Margurite Auerbach, MS, ASCM CEP, Exercise Physiologist;Kelly Amedeo Plenty, BS, ACSM CEP, Exercise Physiologist    Virtual Visit No    Medication changes reported     No    Fall or balance concerns reported    No    Warm-up and Cool-down Performed on first and last piece of equipment    Resistance Training Performed Yes    VAD Patient? No    PAD/SET Patient? No      Pain Assessment   Currently in Pain? No/denies                Social History   Tobacco Use  Smoking Status Former   Pack years: 0.00   Types: Cigarettes   Quit date: 09/09/2020   Years since quitting: 0.2  Smokeless Tobacco Never    Goals Met:  Independence with exercise equipment Exercise tolerated well No report of cardiac concerns or symptoms Strength training completed today  Goals Unmet:  Not Applicable  Comments:  Adam Carter graduated today from  rehab with 36 sessions completed.  Details of the patient's exercise prescription and what He needs to do in order to continue the prescription and progress were discussed with patient.  Patient was given a copy of prescription and goals.  Patient verbalized understanding.  Adam Carter plans to continue to exercise by using treadmill at home and possibly [place].     Dr. Emily Filbert is Medical Director for [place].  Dr. Ottie Glazier is Medical Director for [facility].

## 2021-01-03 NOTE — Progress Notes (Signed)
Office Visit    Patient Name: Adam Carter Date of Encounter: 01/04/2021  PCP:  Aviva Kluver   [facility]  Cardiologist:  Lorine Bears, MD  Advanced Practice Provider: Marisue Ivan, PA-C Electrophysiologist:  None :[phone]}   Chief Complaint    Chief Complaint  Patient presents with   Follow-up    Patient would like to know if he needs to take antibiotics prior to dental procedures.    45 year old male with history of VF arrest / cardiac arrest and anterior lateral ST elevation myocardial infarction s/p PCI/DES to occluded ostial LAD 09/07/2020 with clinical course been complicated by aspiration pneumonia, acute on chronic HFrEF/ICM with EF 25 to 35% by LV gram ( 30-35% by echo with LifeVest  40-45% as of 10/2020 with removal of LifeVest), prior history of tobacco use, elevated A1c, recent history of hypokalemia, and seen today for 1 month follow-up.  Past Medical History    Past Medical History:  Diagnosis Date   CHF (congestive heart failure) (HCC)    Coronary artery disease    occluded ostial LAD with otherwise no obstructive disease.   PCI/DES to ostial LAD   HFrEF (heart failure with reduced ejection fraction) (HCC)    EF 30-35%   History of cardiac arrest    VF arrest 3/14   History of ST elevation myocardial infarction (STEMI)    09/07/20 anteriolateral STEMI   History of tobacco use    History of ventricular fibrillation    3/14   Hyperlipidemia LDL goal <70    Past Surgical History:  Procedure Laterality Date   CARDIAC CATHETERIZATION     CORONARY/GRAFT ACUTE MI REVASCULARIZATION N/A 09/07/2020   Procedure: Coronary/Graft Acute MI Revascularization;  Surgeon: Iran Ouch, MD;  Location: [facility] INVASIVE CV LAB;  Service: Cardiovascular;  Laterality: N/A;   RIGHT/LEFT HEART CATH AND CORONARY ANGIOGRAPHY N/A 09/07/2020   Procedure: RIGHT/LEFT HEART CATH AND CORONARY ANGIOGRAPHY;  Surgeon: Iran Ouch, MD;  Location: [facility]  INVASIVE CV LAB;  Service: Cardiovascular;  Laterality: N/A;    Allergies  Allergies  Allergen Reactions   Bacitracin-Neomycin-Polymyxin Hives   Bupropion Nausea Only    Passed out    History of Present Illness    Adam Carter is a 45 y.o. male with PMH as above.  Family history includes uncle and father that may have had a heart attack. He had reportedly been dealing with cervical spine issues and had an injection done about a week ago.  He reported intermittent chest pain over the weekend but did not seek medical attention.  He went to work the day of his cardiac event and reported some mild chest discomfort initially, but then after lunch he reported severe chest pain followed by collapse.  Coworkers witnessed the collapse and did CPR.  When EMS arrived, he was in ventricular fibrillation.  He received 2 shocks and a single dose of epinephrine and had ROSC.  Post arrest EKG showed anterior ST elevation myocardial infarction.  STEMI code was activated from the field.  The patient was brought to the ED and his airway was secured with a ET tube.  He had an IV placed and was given heparin.  Emergent catheterization was performed.  Echo showed LVEF 30 to 35%, regional wall motion abnormalities of LV, mild LVH, moderate hypokinesis of the left ventricular, mid apical, anteroseptal wall, anterior wall, and anterior lateral wall.  LGS -5.3%.  Catheterization was performed that showed occluded ostial LAD with otherwise  no obstructive disease.  Successful PCI/DES was performed to the ostial LAD.  High-sensitivity troponin peaked at 27,000.  He was extubated and off of nor epi as of 3/14.  He was started on low-dose losartan 12.5 mg daily.  He was fitted with LifeVest.  Brilinta card given to patient.  Given his bradycardia, beta-blocker was not added.  He was started on oral Lasix 20 mg daily.  Due to aspiration pneumonia/leukocytosis/fever, he was on antibiotics.  When seen in clinic 08/19/20, he  reported chest soreness, occasional dizziness, orthopnea, cough with clear phlegm, and a right foot blister. He was not smoking (quit at discharge). 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  He was drinking 1 beer every 3 to 4 months. Given his low BP, losartan held while diuresis started for 3 days with lasix  daily. GDMT unable to be escalated due to BP.   Seen 09/23/20 and mainly noted fatigue. On exam, euvolemic. He was with his girlfriend, who reported he was not drinking much fluid. He reported ongoing soreness in his chest but improving. He reported positional CP each AM and CP elicited by coughing. He had cervical neck pain. Cough was improving. He reported dizziness with position changes. He had received his BP cuff to start monitoring at home. He hd not yet started cardiac rehab. At his girlfriend's house, his home wt has been 173-178lbs. He had scant blood in his tissue when blowing his nose or using the restroom but no other s/sx of bleeding. His foot blister was improving. He does not yet have a PCP with # provided. He continued to avoid smoking. He was eating heart healthy with lots of vegetables. He had not yet had his visit with the HF clinic. He continued to wear his LifeVest.   Echo 10/2020 with EF improved 40-45%. Lifevest has been removed.  Seen 11/26/20 and doing well since weaning off his LifeVest. He had some anxiety with driving. He was going to cardiac rehab, which was going well and without any CP or SOB. His chest soreness was pretty much resolved, though he did notice pops at times with his sternum. He used his lasix on a PRN basis and had not needed it lately. He had overhauled his diet and was avoiding salt, though did report eating high salt items during his birthday. He otherwise reported a very heart healthy diet. He was still not smoking with congratulations provided.  No medication changes made.  After  labs, KCL tab started to take with lasix. LDL 40. Future considerations included WatchPat study with StopBang score of 3 below.  On 12/07/2020, he saw the heart failure clinic.  BP 110/60, HR 72 bpm.  He was started on carvedilol 3.125 mg twice daily.  He finished cardiac rehab 12/21/2020.  Today, 01/04/2021, he returns to clinic and notes that he has been doing well since his last visit.  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  He has completed cardiac rehab and has noted he is not exercising as much as he did when attending rehab.  He realizes that he now has to carve out time to exercise.  He recently purchased a pedal machine.  He denies any chest pain, SOB, or DOE.  He has racing heart rate when feeling overwhelmed but otherwise no racing heart rate with  activity.  He is tolerating Coreg 3.125 mg twice daily, started by the heart failure clinic.  He reports 1 episode of feeling dizzy when popping up from the ground ("a burpee like motion") without any pause between the 2 motions.  This has not recurred since that time.  He reports seeing blood in his stool/hematochezia over the last 2 weeks.  He wonders if this is due to eating a lot of popcorn.  A picture was reviewed of the blood in his stool on his phone with subsequent GI referral recommended, as well as a CBC checked today (given that he is on Brilinta).  He does report a stabbing pain in his stomach that occurred the weekend before last (after 7/4), as well as the day of his visit.  Recent labs have shown a history of hypokalemia, which we discussed as possibly 2/2 GI issues.  He denies any alcohol use.  He continues to avoid tobacco products.  SBP at home 110 over 65-90/57.  HR 58 to 70 bpm.  He is staying away from salt with some exceptions, such as this last Saturday when at a family cookout.  He still feels as if he is retaining fluid from the family  cookout, and thus he took a Lasix this morning for weight gain/edema.  He has only taken Lasix 10 times since the prescription was started at a as needed frequency.  He is trying to eat less oil.  He reports medication compliance.  Watch pat was again discussed with patient report that his snoring has resolved, attributed to recent weight loss and healthy levels of activity/healthy diet.  He also has noticed that he no longer has a morning headache.  STOP-BANG score updated to a total of 2, which no longer qualifies him for a WatchPat.  He will update [place] if this changes.  He asks about antibiotics prior to dental procedures and reassured that this is not indicated based on his cardiac history.  Home Medications    Current Outpatient Medications on File Prior to Visit  Medication Sig Dispense Refill   aspirin 81 MG chewable tablet Chew 1 tablet (81 mg total) by mouth daily. 30 tablet 2   atorvastatin (LIPITOR) 80 MG tablet Take 1 tablet (80 mg total) by mouth daily at 6 PM. 30 tablet 3   carvedilol (COREG) 3.125 MG tablet Take 1 tablet (3.125 mg total) by mouth 2 (two) times daily. 180 tablet 3   furosemide (LASIX) 20 MG tablet Take 1 tablet (20 mg total) by mouth daily as needed. 90 tablet 3   Multiple Vitamin (MULTIVITAMIN WITH MINERALS) TABS tablet Take 1 tablet by mouth daily. 30 tablet 0   pantoprazole (PROTONIX) 20 MG tablet Take 1 tablet (20 mg total) by mouth daily. 30 tablet 5   potassium chloride (MICRO-K) 10 MEQ CR capsule Take 10 mEq by mouth daily as needed. Take with furosemide     sacubitril-valsartan (ENTRESTO) 24-26 MG Take 1 tablet by mouth 2 (two) times daily. 60 tablet 3   ticagrelor (BRILINTA) 90 MG TABS tablet Take 1 tablet (90 mg total) by mouth 2 (two) times daily. 60 tablet 3   No current facility-administered medications on file prior to visit.    Review of Systems    He denies chest pain like before admission and reports resolution of chest soreness.  No palpitations,  dyspnea, pnd, orthopnea, n, v, syncope, or early satiety.  He had 1 episode of dizziness when propping up from the ground too  quickly, which has not recurred.  He has noticed hematochezia and wonders if this is due to eating a lot of popcorn.  When eating salt, he notices that he retains fluid with slight edema/weight gain.  This recently occurred after a cookout on Saturday, and so he took Lasix this morning.  His girlfriend reports resolution of snoring, and the patient reports resolution of morning headache.  He attributes the resolution of his snoring to recent weight loss/activity/healthy lifestyle.  All other systems reviewed and are otherwise negative except as noted above.  Physical Exam    VS:  BP 100/70 (BP Location: Left Arm, Patient Position: Sitting, Cuff Size: Normal)   Pulse 66   Ht 5\' 7"  (1.702 m)   Wt 171 lb (77.6 kg)   SpO2 98%   BMI 26.78 kg/m  , BMI Body mass index is 26.78 kg/m. GEN: Well nourished, well developed, in no acute distress.  HEENT: normal. Neck: Supple, no JVD, carotid bruits, or masses.  Cardiac:RRR, no murmurs, rubs, or gallops. No clubbing, cyanosis. No edema.  Radials/DP/PT 2+ and equal bilaterally.   Respiratory:  Respirations regular and unlabored, CTAB. GI: Soft, nontender, nondistended, BS + x 4. MS: no deformity or atrophy. Neuro:  Strength and sensation are intact. Psych: Normal affect.  Accessory Clinical Findings    ECG personally reviewed by me today -no ekg  VITALS Reviewed today   Temp Readings from Last 3 Encounters:  09/13/20 99.1 F (37.3 C) (Oral)   BP Readings from Last 3 Encounters:  01/04/21 100/70  12/07/20 110/60  11/26/20 100/64   Pulse Readings from Last 3 Encounters:  01/04/21 66  12/07/20 72  11/26/20 68    Wt Readings from Last 3 Encounters:  01/04/21 171 lb (77.6 kg)  12/10/20 167 lb 3.2 oz (75.8 kg)  12/07/20 168 lb 8 oz (76.4 kg)     LABS  reviewed today   Lab Results  Component Value Date   WBC 12.1  (H) 09/16/2020   HGB 13.6 09/16/2020   HCT 40.7 09/16/2020   MCV 92.5 09/16/2020   PLT 393 09/16/2020   Lab Results  Component Value Date   CREATININE 0.88 11/30/2020   BUN 7 11/30/2020   NA 139 11/30/2020   K 3.4 (L) 11/30/2020   CL 104 11/30/2020   CO2 26 11/30/2020   Lab Results  Component Value Date   ALT 18 11/30/2020   AST 18 11/30/2020   ALKPHOS 123 11/30/2020   BILITOT 1.0 11/30/2020   Lab Results  Component Value Date   CHOL 87 11/30/2020   HDL 27 (L) 11/30/2020   LDLCALC 40 11/30/2020   TRIG 99 11/30/2020   CHOLHDL 3.2 11/30/2020    Lab Results  Component Value Date   HGBA1C 6.0 (H) 09/08/2020   No results found for: TSH   STUDIES/PROCEDURES reviewed today   STOP-BANG Rex assessment S-snore  Have you been told that you snore?   1  0 (Update 7/11= no more snoring)  T-tired Are you often tired, fatigued, sleepy during the day?   1  O-obstruction Do you stop breathing, choke, or gasp during sleep?   0  P-pressure Do you have or are you being treated for high blood pressure?  0  B- BMI Is your BMI greater than 35 kg/m?  0  A- age Are you 50 years or older?  0  N-neck Do you have a neck circumference greater than 16 inches?  0  G-gender Are you male?  1  Total  3  2 (no more snoring as of 7/11)   Risk Score <3 Low risk of OSA ?3 High risk of OSA  Echo 10/23/20  1. Left ventricular ejection fraction, by estimation, is 40 to 45%. The  left ventricle has mildly decreased function. The left ventricle  demonstrates regional wall motion abnormalities (hypokinesis of the mid to  apical anterior and anteroseptal wall).  Left ventricular diastolic parameters are consistent with Grade II  diastolic dysfunction (pseudonormalization).   2. Right ventricular systolic function is normal. The right ventricular  size is normal.   3. The mitral valve is normal in structure. No evidence of mitral valve  regurgitation. No evidence of mitral stenosis.   Comparison(s): EF 30-35%, moderate hypokinesis LV, mid-apical,  anteroseptal wall, anterior wall, anterolateral wall.  GLS -5.3%.   Right and left heart catheterization 09/07/2020 The left ventricular ejection fraction is 25-35% by visual estimate. There is moderate to severe left ventricular systolic dysfunction. LV end diastolic pressure is moderately elevated. Ost LAD to Prox LAD lesion is 100% stenosed. Post intervention, there is a 0% residual stenosis. A drug-eluting stent was successfully placed using a STENT RESOLUTE ONYX 3.5X12. Mid LAD lesion is 30% stenosed. 1.  Out of hospital cardiac arrest with anterior ST elevation myocardial infarction due to ostial occlusion of the LAD.  No other obstructive disease. 2.  Moderately severely reduced LV systolic function with an EF of 25 to 30% with severe anterior/apical hypokinesis. 3.  Successful angioplasty and drug-eluting stent placement to the ostial LAD. 4.  Right heart catheterization was performed after PCI and showed mildly elevated filling pressures, mild pulmonary hypertension and normal cardiac output.  PA sat was 78.7%.  Fick cardiac output was 6.17 with a cardiac index of 3.5.  CPO was 1.4.  Based on these hemodynamic numbers, no hemodynamic support was utilized. Recommendations: Dual antiplatelet therapy for at least 1 year. High-dose atorvastatin. We will run Aggrastat infusion for 12 hours given uncertainty of oral absorption of antiplatelet medications. The patient had significant agitation and this will need to be managed.  He is currently on propofol drip and fentanyl drip.  I discussed with Dr. Belia HemanKasa. Recommend electrolyte replacement.   Critical care time of 45 minutes managing abnormal hemodynamics, ventilator management and electrolytes. There is a 0% residual stenosis post intervention.    Wall Motion  Resting                Left Heart  Left Ventricle The left ventricular size is normal. There is  moderate to severe left ventricular systolic dysfunction. LV end diastolic pressure is moderately elevated. The left ventricular ejection fraction is 25-35% by visual estimate. There are LV function abnormalities due to segmental dysfunction.   Coronary Diagrams   Diagnostic Dominance: Right    Intervention       Echocardiogram 09/08/2020  1. Left ventricular ejection fraction, by estimation, is 30 to 35%. The  left ventricle has moderately decreased function. The left ventricle  demonstrates regional wall motion abnormalities (see scoring  diagram/findings for description). There is mild  left ventricular hypertrophy. Left ventricular diastolic parameters were  normal. There is moderate hypokinesis of the left ventricular, mid-apical  anteroseptal wall, anterior wall and anterolateral wall. The average left  ventricular global longitudinal  strain is -5.3 %. The global longitudinal strain is abnormal.   2. Right ventricular systolic function is normal. The right ventricular  size is normal. Tricuspid regurgitation signal is inadequate for assessing  PA  pressure.   3. The mitral valve is normal in structure. No evidence of mitral valve  regurgitation. No evidence of mitral stenosis.   4. The aortic valve is normal in structure. Aortic valve regurgitation is  not visualized. No aortic stenosis is present.    Assessment & Plan    Coronary artery disease History of anterior lateral ST elevation myocardial infarction History of VF arrest/cardiac arrest --No chest pain or symptoms similar to that before his 3/14 presentation.   He underwent emergent LHC PCI/DES to the ostial LAD.  LV gram demonstrated EF 25 to 35% with 3/15 echo showing EF 30 to 35%. Most recent 4/29 echo improved above 35% at 40-45% with LV hypokinesis and G2DD.  Continue DAPT with ASA 81 mg qd and Brilinta 90mg  BID.  Continue Protonix 20mg  qd.  Obtain CBC for hematochezia on DAPT.  See below - GI referral  also provided.    Continue Coreg 3.125 mg BID, started by HF clinic. Continue atorvastatin 80 mg qd. RF modification. Encouraged increased activity, heart healthy diet.  Chronic HFrEF (EF 30-35%) History of cardiogenic shock --Euvolemic and well compensated on exam today, despite the cookout over the weekend.  He does report taking his as needed Lasix this morning, given weight gain and feeling of holding onto fluid after his cookout over the weekend.  Weight today 171 pounds, down from previous clinic weight of 173 pounds (11/26/2020).  BP soft at 100/70, though patient does report a long history of soft BP and has tolerated recently added Coreg well. Continue PRN Lasix 20 mg for wt increase 5lbs in 1 week or 3lb overnight.   Start spironolactone 12.5mg  qd.   BP soft today, attributed to taking the Lasix this morning. Pt reports home BP usually higher, and he does not usually take lasix. Advised pt to contact the office if his BP runs on the low side (with dizziness) after start of spironolactone.   Obtain BMET. Spironolactone should assist with recent hypokalemia. He will hold his KCL tab until labs resulted. Continue Entresto 24-26 BID, Coreg 3.125 mg BID.   At RTC, consider addition of SGLT2 inhibitor /escalation of GDMT.   CHF education reviewed.   Hyperlipidemia, LDL goal less than 70 --Continue atorvastatin 80 mg daily.  LDL 40 and at goal.  LFTs WNL  Hematochezia, stomach pains, recent hypokalemia --Reports hematochezia, which he noticed 2 weeks ago.  Phone picture reviewed. Also reports stomach pains. No EtOH use. Recent low K on labs. Given his low K, stomach pains, and hematochezia on DAPT, ordered CBC today.  GI referral provided. Continue PPI.  History of hypokalemia --Noted on recent BMET with KCl tab started to take with PRN Lasix.  GI referral provided for recent stomach pains / hematochezia in this setting.   Started spironolactone with BMET today and at RTC.  BMET today, RTC.  Hold KCl tab for now.  Further recommendations as needed regarding Kcl tab pending results of BMET.  History of snoring/HA, resolved --His girlfriend reports that he has no longer been snoring, which he attributes to weight loss.  No more morning HA. We will defer the WatchPat, given StopBang score drops to 2 with this new finding.  History of tobacco use --Ongoing cessation recommended.   Elevated A1C /pre-DM2 --3/15 A1c 6.0.  Reviewed diet and lifestyle. Recommend follow-up with PCP. Recommend adding 01/26/2021 / SGLT2i at RTC.  Medication management/questions --Reports ongoing issues with cost of Entresto.  On review of his chart, it appears as  if the medication management form was completed and initial preauthorization approved, though we are unable to track it past that point.  We will continue to look into this issue.  He currently reports the cost of Sherryll Burger is dropping from $600  $200. --Clarified that he does not need antibiotics prior to dental procedures.  Medication changes:  --Start spironolactone 12.5 mg daily.   -- Hold KCl tab, taken with as needed Lasix - Refills provided. Labs ordered:  --BMET today (Cr/L check with spironolactone start, KCl tab hold) --BMET at RTC (recheck Cr, K after spironolactone start) --CBC today (hematochezia on DAPT) Studies / Imaging/ Referrals ordered:  --GI referral (hematochezia, stomach pains, hypokalemia). Future considerations:  -- Addition of SGLT2 inhibitor - Escalation of GDMT  Disposition: RTC 1 mo  Lennon Alstrom, PA-C

## 2021-01-04 ENCOUNTER — Encounter: Payer: Self-pay | Admitting: Physician Assistant

## 2021-01-04 ENCOUNTER — Other Ambulatory Visit: Payer: Self-pay

## 2021-01-04 ENCOUNTER — Other Ambulatory Visit
Admission: RE | Admit: 2021-01-04 | Discharge: 2021-01-04 | Disposition: A | Discharge status: Home / Self Care | Payer: No Typology Code available for payment source | Attending: Physician Assistant | Admitting: Physician Assistant

## 2021-01-04 ENCOUNTER — Ambulatory Visit: Payer: No Typology Code available for payment source | Admitting: Physician Assistant

## 2021-01-04 VITALS — BP 100/70 | HR 66 | Ht 67.0 in | Wt 171.0 lb

## 2021-01-04 DIAGNOSIS — Z8639 Personal history of other endocrine, nutritional and metabolic disease: Secondary | ICD-10-CM

## 2021-01-04 DIAGNOSIS — I5020 Unspecified systolic (congestive) heart failure: Secondary | ICD-10-CM

## 2021-01-04 DIAGNOSIS — R7309 Other abnormal glucose: Secondary | ICD-10-CM

## 2021-01-04 DIAGNOSIS — Z8674 Personal history of sudden cardiac arrest: Secondary | ICD-10-CM

## 2021-01-04 DIAGNOSIS — I2510 Atherosclerotic heart disease of native coronary artery without angina pectoris: Secondary | ICD-10-CM

## 2021-01-04 DIAGNOSIS — Z87898 Personal history of other specified conditions: Secondary | ICD-10-CM

## 2021-01-04 DIAGNOSIS — R109 Unspecified abdominal pain: Secondary | ICD-10-CM

## 2021-01-04 DIAGNOSIS — Z79899 Other long term (current) drug therapy: Secondary | ICD-10-CM | POA: Diagnosis not present

## 2021-01-04 DIAGNOSIS — I252 Old myocardial infarction: Secondary | ICD-10-CM

## 2021-01-04 DIAGNOSIS — E785 Hyperlipidemia, unspecified: Secondary | ICD-10-CM

## 2021-01-04 DIAGNOSIS — K921 Melena: Secondary | ICD-10-CM

## 2021-01-04 DIAGNOSIS — Z87891 Personal history of nicotine dependence: Secondary | ICD-10-CM

## 2021-01-04 DIAGNOSIS — Z8679 Personal history of other diseases of the circulatory system: Secondary | ICD-10-CM

## 2021-01-04 LAB — BASIC METABOLIC PANEL
Anion gap: 6 (ref 5–15)
BUN: 8 mg/dL (ref 6–20)
CO2: 28 mmol/L (ref 22–32)
Calcium: 9 mg/dL (ref 8.9–10.3)
Chloride: 105 mmol/L (ref 98–111)
Creatinine, Ser: 0.87 mg/dL (ref 0.61–1.24)
GFR, Estimated: >60 mL/min (ref >60)
Glucose, Bld: 103 mg/dL — ABNORMAL HIGH (ref 70–99)
Potassium: 3.7 mmol/L (ref 3.5–5.1)
Sodium: 139 mmol/L (ref 135–145)

## 2021-01-04 LAB — CBC
HCT: 38.8 % — ABNORMAL LOW (ref 39.0–52.0)
Hemoglobin: 13 g/dL (ref 13.0–17.0)
MCH: 31 pg (ref 26.0–34.0)
MCHC: 33.5 g/dL (ref 30.0–36.0)
MCV: 92.4 fL (ref 80.0–100.0)
Platelets: 245 10*3/uL (ref 150–400)
RBC: 4.2 MIL/uL — ABNORMAL LOW (ref 4.22–5.81)
RDW: 13.3 % (ref 11.5–15.5)
WBC: 8.3 10*3/uL (ref 4.0–10.5)
nRBC: 0 % (ref 0.0–0.2)

## 2021-01-04 MED ORDER — TICAGRELOR 90 MG PO TABS: 90.0000 mg | ORAL_TABLET | Freq: Two times a day (BID) | ORAL | 3 refills | Status: DC

## 2021-01-04 MED ORDER — ATORVASTATIN CALCIUM 80 MG PO TABS: 80.0000 mg | ORAL_TABLET | Freq: Every day | ORAL | 3 refills | Status: DC

## 2021-01-04 MED ORDER — SPIRONOLACTONE 25 MG PO TABS: 12.5000 mg | ORAL_TABLET | Freq: Every day | ORAL | 1 refills | Status: DC

## 2021-01-04 MED ORDER — FUROSEMIDE 20 MG PO TABS: 20.0000 mg | ORAL_TABLET | Freq: Every day | ORAL | 0 refills | Status: DC | PRN

## 2021-01-04 NOTE — Patient Instructions (Addendum)
Medication Instructions:  - Your physician has recommended you make the following change in your medication:   1) STOP potassium  2) START Spironolactone 25 mg- take 0.5 tablet (12.5 mg) by mouth ONCE daily   3) Samples Given: Entresto 24/24 mg Lot: [id number] 1 bottle given     *If you need a refill on your cardiac medications before your next appointment, please call your pharmacy*   Lab Work: - Your physician recommends that you have lab work today: BMP/ CBC  - Your physician recommends that you return for lab work in: 1-2 weeks- BMP  Medical Pharmacologist at [facility] 1st desk on the right to check in, past the screening table Lab hours: Monday- Friday (7:30 am- 5:30 pm)  If you have labs (blood work) drawn today and your tests are completely normal, you will receive your results only by: MyChart Message (if you have MyChart) OR A paper copy in the mail If you have any lab test that is abnormal or we need to change your treatment, we will call you to review the results.   Testing/Procedures: - You have been referred to Gastroenterology: blood in your stool (You will be called directly from the GI office to schedule your appointment)   Follow-Up: At [facility], you and your health needs are our priority.  As part of our continuing mission to provide you with exceptional heart care, we have created designated Provider Care Teams.  These Care Teams include your primary Cardiologist (physician) and Advanced Practice Providers (APPs -  Physician Assistants and Nurse Practitioners) who all work together to provide you with the care you need, when you need it.  We recommend signing up for the patient portal called "MyChart".  Sign up information is provided on this After Visit Summary.  MyChart is used to connect with patients for Virtual Visits (Telemedicine).  Patients are able to view lab/test results, encounter notes, upcoming appointments, etc.  Non-urgent messages can be sent  to your provider as well.   To learn more about what you can do with MyChart, go to [email or web address].    Your next appointment:   1 month(s)  The format for your next appointment:   In Person  Provider:   You may see Lorine Bears, MD or one of the following Advanced Practice Providers on your designated Care Team:   Nicolasa Ducking, NP Eula Listen, PA-C Marisue Ivan, PA-C Cadence Fransico Michael, [place]   Other Instructions

## 2021-01-06 ENCOUNTER — Telehealth: Payer: Self-pay | Admitting: Physician Assistant

## 2021-01-06 NOTE — Telephone Encounter (Signed)
Noted- to Marisue Ivan, PA as an [place].

## 2021-01-06 NOTE — Telephone Encounter (Signed)
Patient returning call and aware of below .  He will reach out to [facility] and see if rx is ready and agrees to call back with future needs or questions.

## 2021-01-06 NOTE — Telephone Encounter (Signed)
Patient seen in clinic on 01/04/21 with Marisue Ivan, PA. Per Harvel Quale the patient was having trouble affording entresto and having to borrow money from his mother to pay for this. This was ~ $600, then came down to ~ 200 per the patient.  He was previously given assistance paperwork to complete.   I advised the patient that with his commercial insurance, he should qualify for the $10 co-pay on this medication. He advised he tried to use this and this is when the RX came down from ~ $600>> $200.  I had advised him on Monday I would follow up with the pharmacy and drug rep if needed.  He was also given the # to [facility] to call and ask questions regarding the co-pay card.  I was able to call the pharmacy today and inquire if they had tried to run his [employer] with the $10 co-pay card. Per the pharmacy staff, they did run this with the co-pay card on 12/11/20 and it cost him $10 (he did pick this up) and this was for a 30-day supply. I inquired when this could be filled again. The pharmacy staff ran the RX through while I was on the phone and advised it was fillable today. They have run this and it will cost him $10.  I have attempted to call the patient. No answer- I left a message to please call back.

## 2021-01-07 ENCOUNTER — Telehealth: Payer: Self-pay | Admitting: Cardiovascular Disease

## 2021-01-07 NOTE — Telephone Encounter (Signed)
Adam Carter "Adam Carter" - 01/07/2021  9:33 AM Lennon Alstrom, PA-C [phone])  Sent: Morey Hummingbird January 07, 2021  3:31 PM  To: Jefferey Pica, RN; P [facility] Triage          Message  Could he send me a few more blood pressure readings (aka Mychart message me with some twice daily readings from now through the weekend?)    I do not want to make a recommendation based on one blood pressure, if possible.

## 2021-01-07 NOTE — Telephone Encounter (Signed)
Patient has tried several ways but is having difficulty splitting spironolactone medication in half Would like to know if he can take full pill Please call to discuss

## 2021-01-07 NOTE — Telephone Encounter (Signed)
I spoke with the patient. He advised that he has tried 2 different brands of pill splitters and is having a lot of trouble cutting the spironolactone in 1/2. He states he took a full tablet last night ~ 8:00 pm. His BP this morning is 95/57.  He advised he feels no different today vs any other day- no dizziness/ lightheadedness. He states his SBP was running low 100s prior to starting entresto and he didn't really see a change with adding the entresto.  He advised Jacquelyn, PA recommended only taking 12.5 mg of spironolactone to start due to an episode of dizziness that he had recently. He advised this was a 1 time event and he has been fine since. He denise symptoms of orthostasis with positional changes.  I have advised the patient I will send to [facility] to review. He will continue to take the full tablet of spironolactone until he hears back from [place].   The patient voices understanding and is agreeable.

## 2021-01-07 NOTE — Telephone Encounter (Signed)
I spoke with the patient and confirmed he does have access to his MyChart account. I have asked, per Marisue Ivan, PA, that he monitor his BP through the weekend on the Spironolactone 25 mg once daily and send [place] a report of these readings on Monday 7/18.  The patient voices understanding and is agreeable.

## 2021-01-14 ENCOUNTER — Other Ambulatory Visit: Payer: Self-pay | Admitting: *Deleted

## 2021-01-14 MED ORDER — SPIRONOLACTONE 25 MG PO TABS: 25.0000 mg | ORAL_TABLET | Freq: Every day | ORAL | 2 refills | Status: DC

## 2021-01-29 NOTE — Progress Notes (Signed)
Patient ID: Adam Carter, male    DOB: February 24, 1976, 45 y.o.   MRN: [id number]  HPI  Adam Carter is a 45 y/o male with a history of CAD, hyperlipidemia, VF with arrest, previous tobacco use and chronic heart failure.   Echo report from 10/23/20 reviewed and showed an EF of 40-45%. Echo report from 09/08/20 reviewed and showed an EF of 30-35% along with mild LVH.   RHC/ LHC done 09/07/20 showed: The left ventricular ejection fraction is 25-35% by visual estimate. There is moderate to severe left ventricular systolic dysfunction. LV end diastolic pressure is moderately elevated. Ost LAD to Prox LAD lesion is 100% stenosed. Post intervention, there is a 0% residual stenosis. A drug-eluting stent was successfully placed using a STENT RESOLUTE ONYX 3.5X12. Mid LAD lesion is 30% stenosed.   1.  Out of hospital cardiac arrest with anterior ST elevation myocardial infarction due to ostial occlusion of the LAD.  No other obstructive disease. 2.  Moderately severely reduced LV systolic function with an EF of 25 to 30% with severe anterior/apical hypokinesis. 3.  Successful angioplasty and drug-eluting stent placement to the ostial LAD. 4.  Right heart catheterization was performed after PCI and showed mildly elevated filling pressures, mild pulmonary hypertension and normal cardiac output.  PA sat was 78.7%.  Fick cardiac output was 6.17 with a cardiac index of 3.5.  CPO was 1.4.  Based on these hemodynamic numbers, no hemodynamic support was utilized.  Admitted 09/07/20 due to out of hospital cardiac arrest due to anterior STEMI with subsequent VF. Cardiology and nephrology consults obtained. Cath done with stent placed. Successfully extubated. Lifevest placed. Given IV antibiotics due to pneumonia. Weaned off oxygen down to room air. Initially needed pressors due to septic shock but BP stabilized and weaned off pressors. Does of IV lasix given. Discharged after 6 days.   He presents today for a follow-up visit  with a chief complaint of minimal fatigue upon moderate exertion. He describes this as being present for several months. He has associated light-headedness, easy bruising, anxiety & continued sternal pain along with this. He denies any difficulty sleeping, abdominal distention, palpitations, pedal edema, chest pain, shortness of breath, cough or weight gain.   Admits that he continues to struggle with anxiety/ depression related to his health, finances with all these new medications and now a different job role that he's not enjoying. 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   Continues to not smoke and is still adapting to his diet. Says that he eats "a lot" of popcorn that he cooks on the stove so it doesn't have any butter or salt on it. He's concerned about that now because he's noticing some blood in his stool and has upcoming GI evaluation next month.   Past Medical History:  Diagnosis Date   CHF (congestive heart failure) (HCC)    Coronary artery disease    occluded ostial LAD with otherwise no obstructive disease.   PCI/DES to ostial LAD   HFrEF (heart failure with reduced ejection fraction) (HCC)    EF 30-35%   History of cardiac arrest    VF arrest 3/14   History of ST elevation myocardial infarction (STEMI)    09/07/20 anteriolateral STEMI   History of tobacco use    History of ventricular  fibrillation    3/14   Hyperlipidemia LDL goal <70    Past Surgical History:  Procedure Laterality Date   CARDIAC CATHETERIZATION     CORONARY/GRAFT ACUTE MI REVASCULARIZATION N/A 09/07/2020   Procedure: Coronary/Graft Acute MI Revascularization;  Surgeon: Iran Ouch, MD;  Location: [facility] INVASIVE CV LAB;  Service: Cardiovascular;  Laterality: N/A;   RIGHT/LEFT HEART CATH AND CORONARY ANGIOGRAPHY N/A 09/07/2020    Procedure: RIGHT/LEFT HEART CATH AND CORONARY ANGIOGRAPHY;  Surgeon: Iran Ouch, MD;  Location: [facility] INVASIVE CV LAB;  Service: Cardiovascular;  Laterality: N/A;   Family History  Problem Relation Age of Onset   Diabetes Father    Social History   Tobacco Use   Smoking status: Former    Types: Cigarettes    Quit date: 09/09/2020    Years since quitting: 0.3   Smokeless tobacco: Never  Substance Use Topics   Alcohol use: Not Currently    Comment: rare   Allergies  Allergen Reactions   Bacitracin-Neomycin-Polymyxin Hives   Bupropion Nausea Only    Passed out   Prior to Admission medications   Medication Sig Start Date End Date Taking? Authorizing Provider  aspirin 81 MG chewable tablet Chew 1 tablet (81 mg total) by mouth daily. 09/14/20  Yes Enedina Finner, MD  atorvastatin (LIPITOR) 80 MG tablet Take 1 tablet (80 mg total) by mouth daily at 6 PM. 01/04/21  Yes Visser, Jacquelyn D, PA-C  carvedilol (COREG) 3.125 MG tablet Take 1 tablet (3.125 mg total) by mouth 2 (two) times daily. 12/07/20 03/07/21 Yes Murl Golladay, Inetta Fermo A, FNP  furosemide (LASIX) 20 MG tablet Take 1 tablet (20 mg total) by mouth daily as needed. 01/04/21  Yes Marisue Ivan D, PA-C  Multiple Vitamin (MULTIVITAMIN WITH MINERALS) TABS tablet Take 1 tablet by mouth daily. 09/14/20  Yes Enedina Finner, MD  pantoprazole (PROTONIX) 20 MG tablet Take 1 tablet (20 mg total) by mouth daily. 11/16/20 05/15/21 Yes Visser, Jacquelyn D, PA-C  sacubitril-valsartan (ENTRESTO) 24-26 MG Take 1 tablet by mouth 2 (two) times daily. 10/26/20  Yes Marisue Ivan D, PA-C  spironolactone (ALDACTONE) 25 MG tablet Take 1 tablet (25 mg total) by mouth daily. 01/14/21 04/14/21 Yes Visser, Jacquelyn D, PA-C  ticagrelor (BRILINTA) 90 MG TABS tablet Take 1 tablet (90 mg total) by mouth 2 (two) times daily. 01/04/21  Yes Marisue Ivan D, PA-C    Review of Systems  Constitutional:  Positive for fatigue. Negative for appetite change.  HENT:   Negative for congestion, postnasal drip and sore throat.   Eyes: Negative.   Respiratory:  Negative for cough and shortness of breath.   Cardiovascular:  Negative for chest pain, palpitations and leg swelling.  Gastrointestinal:  Negative for abdominal distention and abdominal pain.  Endocrine: Negative.   Genitourinary: Negative.   Musculoskeletal:  Positive for arthralgias (sternal pain when sneezing). Negative for back pain.  Skin: Negative.   Allergic/Immunologic: Negative.   Neurological:  Positive for light-headedness (sometimes with changing positions quickly). Negative for dizziness.  Hematological:  Negative for adenopathy. Bruises/bleeds easily.  Psychiatric/Behavioral:  Negative for dysphoric mood and sleep disturbance (sleeping on 3 pillows). The patient is nervous/anxious.    Vitals:   02/01/21 0936  BP: 100/68  Pulse: 60  Resp: 18  SpO2: 100%  Weight: 169 lb 4 oz (76.8 kg)  Height: 5\' 7"  (1.702 m)   Wt Readings from Last 3 Encounters:  02/01/21 169 lb 4 oz (76.8 kg)  01/04/21 171 lb (77.6 kg)  12/10/20  167 lb 3.2 oz (75.8 kg)   Lab Results  Component Value Date   CREATININE 0.87 01/04/2021   CREATININE 0.88 11/30/2020   CREATININE 0.88 10/26/2020    Physical Exam Vitals reviewed.  Constitutional:      Appearance: Normal appearance.  HENT:     Head: Normocephalic and atraumatic.  Cardiovascular:     Rate and Rhythm: Normal rate and regular rhythm.  Pulmonary:     Effort: Pulmonary effort is normal. No respiratory distress.     Breath sounds: No wheezing or rales.  Abdominal:     General: There is no distension.     Palpations: Abdomen is soft.  Musculoskeletal:        General: No tenderness.     Cervical back: Normal range of motion and neck supple.     Right lower leg: No edema.     Left lower leg: No edema.  Skin:    General: Skin is warm and dry.  Neurological:     General: No focal deficit present.     Mental Status: He is alert and oriented  to person, place, and time.  Psychiatric:        Mood and Affect: Mood is anxious.        Behavior: Behavior normal.        Thought Content: Thought content normal.   Assessment & Plan:  1: Chronic heart failure with reduced ejection fraction (although EF improving)- - NYHA class II - euvolemic today - weighing daily reminded to call for an overnight weight gain of > 2 pounds or a weekly weight gain of > 5 pounds - weight stable from last visit here 2 months ago - not adding salt and has been diligent about reading food labels for sodium content and is trying to keep his daily consumption to around 2000mg / day - drinking ~ 2L of fluid / day - on GDMT of entresto, carvedilol & spironolactone - entresto $10 commercial copay card given to him today - says that he hasn't taken his fluid pill in "awhile" - consider SLGT2 but he mentions that he's concerned about all the medications he takes  - BNP 09/16/20 was 119.7 - no tobacco use since March 2022; congratulated on that  2: CAD- - saw cardiology April 2022) 01/04/21; returns 02/18/21 - no longer wearing lifevest; admits to initially being anxious without it - still has sternal pain at times; no further rib pain - graduated from cardiac rehab; admits that it's difficult getting exercise in due to stationary job now and heat/ humidity outside - he admits that he's anxious and at times depressed because of his health, uncertainty of health and now change in job status; suggested counseling but he says that he's tried that before and was told he "couldn't be fixed"; he does say that he has friends and family that he can talk to about anything  - BMP 01/04/21 reviewed and showed sodium 139, potassium 3.7, creatinine 0.87 and GFR >60   Patient did not bring his medications nor a list. Each medication was verbally reviewed with the patient and he was encouraged to bring the bottles to every visit to confirm accuracy of list.   Due to HF stability and  patient's concern about finances, will not make a return appointment for patient at this time. Advised patient to continue his close f/u with cardiology but that he could call back at anytime to schedule another appointment or for any questions and he was comfortable with this  plan.

## 2021-02-01 ENCOUNTER — Encounter: Payer: Self-pay | Admitting: Family

## 2021-02-01 ENCOUNTER — Ambulatory Visit: Payer: No Typology Code available for payment source | Attending: Family | Admitting: Family

## 2021-02-01 ENCOUNTER — Other Ambulatory Visit: Payer: Self-pay

## 2021-02-01 VITALS — BP 100/68 | HR 60 | Resp 18 | Ht 67.0 in | Wt 169.2 lb

## 2021-02-01 DIAGNOSIS — E785 Hyperlipidemia, unspecified: Secondary | ICD-10-CM | POA: Insufficient documentation

## 2021-02-01 DIAGNOSIS — I2510 Atherosclerotic heart disease of native coronary artery without angina pectoris: Secondary | ICD-10-CM

## 2021-02-01 DIAGNOSIS — R42 Dizziness and giddiness: Secondary | ICD-10-CM | POA: Insufficient documentation

## 2021-02-01 DIAGNOSIS — I252 Old myocardial infarction: Secondary | ICD-10-CM | POA: Diagnosis not present

## 2021-02-01 DIAGNOSIS — Z79899 Other long term (current) drug therapy: Secondary | ICD-10-CM | POA: Diagnosis not present

## 2021-02-01 DIAGNOSIS — Z955 Presence of coronary angioplasty implant and graft: Secondary | ICD-10-CM | POA: Insufficient documentation

## 2021-02-01 DIAGNOSIS — Z7901 Long term (current) use of anticoagulants: Secondary | ICD-10-CM | POA: Diagnosis not present

## 2021-02-01 DIAGNOSIS — K921 Melena: Secondary | ICD-10-CM | POA: Insufficient documentation

## 2021-02-01 DIAGNOSIS — I5022 Chronic systolic (congestive) heart failure: Secondary | ICD-10-CM | POA: Diagnosis not present

## 2021-02-01 DIAGNOSIS — Z8674 Personal history of sudden cardiac arrest: Secondary | ICD-10-CM | POA: Insufficient documentation

## 2021-02-01 DIAGNOSIS — Z87891 Personal history of nicotine dependence: Secondary | ICD-10-CM | POA: Insufficient documentation

## 2021-02-01 DIAGNOSIS — I272 Pulmonary hypertension, unspecified: Secondary | ICD-10-CM | POA: Diagnosis not present

## 2021-02-01 DIAGNOSIS — I5020 Unspecified systolic (congestive) heart failure: Secondary | ICD-10-CM

## 2021-02-01 DIAGNOSIS — Z09 Encounter for follow-up examination after completed treatment for conditions other than malignant neoplasm: Secondary | ICD-10-CM | POA: Insufficient documentation

## 2021-02-01 DIAGNOSIS — Z7982 Long term (current) use of aspirin: Secondary | ICD-10-CM | POA: Insufficient documentation

## 2021-02-01 NOTE — Patient Instructions (Addendum)
Continue weighing daily and call for an overnight weight gain of > 2 pounds or a weekly weight gain of >5 pounds.    Call us in the future if you need us for anything  

## 2021-02-03 ENCOUNTER — Telehealth: Payer: Self-pay | Admitting: *Deleted

## 2021-02-03 NOTE — Telephone Encounter (Signed)
Fax notification received no PA needed for Brilinta 90 mg tablet.

## 2021-02-18 ENCOUNTER — Telehealth: Payer: Self-pay | Admitting: Physician Assistant

## 2021-02-18 ENCOUNTER — Ambulatory Visit (INDEPENDENT_AMBULATORY_CARE_PROVIDER_SITE_OTHER): Payer: No Typology Code available for payment source | Admitting: Cardiovascular Disease

## 2021-02-18 ENCOUNTER — Other Ambulatory Visit: Payer: Self-pay

## 2021-02-18 ENCOUNTER — Encounter: Payer: Self-pay | Admitting: Cardiovascular Disease

## 2021-02-18 VITALS — BP 118/70 | HR 51 | Ht 67.0 in | Wt 171.0 lb

## 2021-02-18 DIAGNOSIS — I5020 Unspecified systolic (congestive) heart failure: Secondary | ICD-10-CM | POA: Diagnosis not present

## 2021-02-18 DIAGNOSIS — Z8674 Personal history of sudden cardiac arrest: Secondary | ICD-10-CM

## 2021-02-18 DIAGNOSIS — I2510 Atherosclerotic heart disease of native coronary artery without angina pectoris: Secondary | ICD-10-CM

## 2021-02-18 DIAGNOSIS — Z8679 Personal history of other diseases of the circulatory system: Secondary | ICD-10-CM

## 2021-02-18 DIAGNOSIS — E785 Hyperlipidemia, unspecified: Secondary | ICD-10-CM | POA: Diagnosis not present

## 2021-02-18 NOTE — Telephone Encounter (Signed)
Patient states Marisue Ivan, [place] ordered a stress test at his last visit. Please advise if patient needs.

## 2021-02-18 NOTE — Patient Instructions (Signed)
Medication Instructions:  Your physician recommends that you continue on your current medications as directed. Please refer to the Current Medication list given to you today.  *If you need a refill on your cardiac medications before your next appointment, please call your pharmacy*   Lab Work: None ordered If you have labs (blood work) drawn today and your tests are completely normal, you will receive your results only by: MyChart Message (if you have MyChart) OR A paper copy in the mail If you have any lab test that is abnormal or we need to change your treatment, we will call you to review the results.   Testing/Procedures: None ordered   Follow-Up: At [facility], you and your health needs are our priority.  As part of our continuing mission to provide you with exceptional heart care, we have created designated Provider Care Teams.  These Care Teams include your primary Cardiologist (physician) and Advanced Practice Providers (APPs -  Physician Assistants and Nurse Practitioners) who all work together to provide you with the care you need, when you need it.  We recommend signing up for the patient portal called "MyChart".  Sign up information is provided on this After Visit Summary.  MyChart is used to connect with patients for Virtual Visits (Telemedicine).  Patients are able to view lab/test results, encounter notes, upcoming appointments, etc.  Non-urgent messages can be sent to your provider as well.   To learn more about what you can do with MyChart, go to [email or web address].    Your next appointment:   3 month(s)  The format for your next appointment:   In Person  Provider:   You may see Lorine Bears, MD or one of the following Advanced Practice Providers on your designated Care Team:   Nicolasa Ducking, NP Eula Listen, PA-C Marisue Ivan, PA-C Cadence Fransico Michael, [place]   Other Instructions N/A

## 2021-02-18 NOTE — Telephone Encounter (Signed)
I do not see mention of stress test in JV's most recent note. He saw Dr. Kirke Corin today for office visit and no testing was recommended. No indication for stress test at this time.   Alver Sorrow, NP

## 2021-02-18 NOTE — Progress Notes (Signed)
Cardiology Office Note   Date:  02/18/2021   ID:  Adam Carter, DOB 04-Feb-1976, MRN [id number]  PCP:  Pcp, No  Cardiologist:  Adam Bears, MD   Chief Complaint  Patient presents with   Other    1 month f/u c/o feeling weird and dizziness. Meds reviewed verbally with pt.      History of Present Illness: Adam Carter is a 44 y.o. male who presents for a follow-up visit regarding coronary artery disease.  The patient has history of prior tobacco use.  He presented in March of this year with out of hospital cardiac arrest due to anterior ST elevation myocardial infarction.  Emergent cardiac catheterization was performed which showed ostial occlusion of the LAD with no other obstructive disease.  Ejection fraction was 25 to 30%.  I performed successful angioplasty and drug-eluting stent placement to the ostial LAD.  Right heart catheterization was not suggestive of cardiogenic shock and he did not require mechanical support.  Hospital course was complicated by aspiration pneumonia and mental status changes but ultimately he was extubated with very good neurologic recovery.  He had a repeat echocardiogram done in May which showed an ejection fraction of 40 to 45%. He quit smoking since his cardiac event.  He also improved his diet significantly and has been adhering to a very low sodium diet.  In that setting, he has been having increased dizziness and orthostatic presyncope.  He did not have history of hypertension before his myocardial infarction.  He does complain of increased anxiety which causes him to have vague discomfort in the chest sometimes but he is able to do activities with no symptoms.   Past Medical History:  Diagnosis Date   CHF (congestive heart failure) (HCC)    Coronary artery disease    occluded ostial LAD with otherwise no obstructive disease.   PCI/DES to ostial LAD   HFrEF (heart failure with reduced ejection fraction) (HCC)    EF 30-35%   History of cardiac  arrest    VF arrest 3/14   History of ST elevation myocardial infarction (STEMI)    09/07/20 anteriolateral STEMI   History of tobacco use    History of ventricular fibrillation    3/14   Hyperlipidemia LDL goal <70     Past Surgical History:  Procedure Laterality Date   CARDIAC CATHETERIZATION     CORONARY/GRAFT ACUTE MI REVASCULARIZATION N/A 09/07/2020   Procedure: Coronary/Graft Acute MI Revascularization;  Surgeon: Adam Ouch, MD;  Location: [facility] INVASIVE CV LAB;  Service: Cardiovascular;  Laterality: N/A;   RIGHT/LEFT HEART CATH AND CORONARY ANGIOGRAPHY N/A 09/07/2020   Procedure: RIGHT/LEFT HEART CATH AND CORONARY ANGIOGRAPHY;  Surgeon: Adam Ouch, MD;  Location: [facility] INVASIVE CV LAB;  Service: Cardiovascular;  Laterality: N/A;     Current Outpatient Medications  Medication Sig Dispense Refill   aspirin 81 MG chewable tablet Chew 1 tablet (81 mg total) by mouth daily. 30 tablet 2   atorvastatin (LIPITOR) 80 MG tablet Take 1 tablet (80 mg total) by mouth daily at 6 PM. 90 tablet 3   carvedilol (COREG) 3.125 MG tablet Take 1 tablet (3.125 mg total) by mouth 2 (two) times daily. 180 tablet 3   furosemide (LASIX) 20 MG tablet Take 1 tablet (20 mg total) by mouth daily as needed. 90 tablet 0   Multiple Vitamin (MULTIVITAMIN WITH MINERALS) TABS tablet Take 1 tablet by mouth daily. 30 tablet 0   pantoprazole (PROTONIX) 20 MG tablet Take 1  tablet (20 mg total) by mouth daily. 30 tablet 5   sacubitril-valsartan (ENTRESTO) 24-26 MG Take 1 tablet by mouth 2 (two) times daily. 60 tablet 3   spironolactone (ALDACTONE) 25 MG tablet Take 1 tablet (25 mg total) by mouth daily. 30 tablet 2   ticagrelor (BRILINTA) 90 MG TABS tablet Take 1 tablet (90 mg total) by mouth 2 (two) times daily. 60 tablet 3   No current facility-administered medications for this visit.    Allergies:   Bacitracin-neomycin-polymyxin and Bupropion    Social History:  The patient  reports that he quit  smoking about 5 months ago. His smoking use included cigarettes. He has never used smokeless tobacco. He reports that he does not currently use alcohol. He reports that he does not use drugs.   Family History:  The patient's family history includes Diabetes in his father.    ROS:  Please see the history of present illness.   Otherwise, review of systems are positive for none.   All other systems are reviewed and negative.    PHYSICAL EXAM: VS:  BP 118/70 (BP Location: Left Arm, Patient Position: Sitting, Cuff Size: Normal)   Pulse (!) 51   Ht 5\' 7"  (1.702 m)   Wt 171 lb (77.6 kg)   SpO2 98%   BMI 26.78 kg/m  , BMI Body mass index is 26.78 kg/m. GEN: Well nourished, well developed, in no acute distress  HEENT: normal  Neck: no JVD, carotid bruits, or masses Cardiac: RRR; no murmurs, rubs, or gallops,no edema  Respiratory:  clear to auscultation bilaterally, normal work of breathing GI: soft, nontender, nondistended, + BS MS: no deformity or atrophy  Skin: warm and dry, no rash Neuro:  Strength and sensation are intact Psych: euthymic mood, full affect   EKG:  EKG is ordered today. The ekg ordered today demonstrates sinus bradycardia with old septal infarct.   Recent Labs: 09/12/2020: Magnesium 2.1 09/16/2020: B Natriuretic Peptide 119.7 11/30/2020: ALT 18 01/04/2021: BUN 8; Creatinine, Ser 0.87; Hemoglobin 13.0; Platelets 245; Potassium 3.7; Sodium 139    Lipid Panel    Component Value Date/Time   CHOL 87 11/30/2020 0731   TRIG 99 11/30/2020 0731   HDL 27 (L) 11/30/2020 0731   CHOLHDL 3.2 11/30/2020 0731   VLDL 20 11/30/2020 0731   LDLCALC 40 11/30/2020 0731      Wt Readings from Last 3 Encounters:  02/18/21 171 lb (77.6 kg)  02/01/21 169 lb 4 oz (76.8 kg)  01/04/21 171 lb (77.6 kg)      No flowsheet data found.    ASSESSMENT AND PLAN:  1.  Coronary artery disease involving native coronary arteries without angina: He is doing very well with no anginal  symptoms.  I encouraged him to start exercising on a regular basis.  Continue dual antiplatelet therapy at least until March 2023 and preferably longer given ostial LAD stent location.  2.  Chronic systolic heart failure due to ischemic cardiomyopathy: He appears to be euvolemic and he has not required furosemide.  Continue carvedilol, Entresto and spironolactone.  Not able to increase the medication due to low blood pressure. I suspect that his orthostatic dizziness and presyncope is related to orthostatic hypotension.  I asked him to increase his sodium and fluid intake.  3.  Hyperlipidemia: I reviewed most recent lipid profile which showed an LDL of 40 which is at target.  Thus, continue current dose of atorvastatin.  4.  Previous tobacco use: Quit smoking after his myocardial  infarction.   Disposition:   FU with me in 3 months  Signed,  Adam Bears, MD  02/18/2021 2:46 PM    [facility]

## 2021-02-19 NOTE — Telephone Encounter (Signed)
Pt seen Dr. Kirke Corin 8/25 No new testing order FU with Dr. Kirke Corin in 3 months  Encounter closed.

## 2021-02-24 NOTE — Addendum Note (Signed)
Addended by: Kendrick Fries on: 02/24/2021 02:31 PM   Modules accepted: Orders

## 2021-03-15 ENCOUNTER — Other Ambulatory Visit: Payer: No Typology Code available for payment source

## 2021-03-16 ENCOUNTER — Other Ambulatory Visit: Payer: Self-pay

## 2021-03-16 ENCOUNTER — Encounter: Payer: Self-pay | Admitting: Gastroenterology

## 2021-03-16 ENCOUNTER — Telehealth: Payer: Self-pay

## 2021-03-16 ENCOUNTER — Ambulatory Visit (INDEPENDENT_AMBULATORY_CARE_PROVIDER_SITE_OTHER): Payer: No Typology Code available for payment source | Admitting: Gastroenterology

## 2021-03-16 VITALS — BP 113/73 | HR 65 | Temp 97.6°F | Ht 67.0 in | Wt 173.8 lb

## 2021-03-16 DIAGNOSIS — K600 Acute anal fissure: Secondary | ICD-10-CM | POA: Diagnosis not present

## 2021-03-16 DIAGNOSIS — K625 Hemorrhage of anus and rectum: Secondary | ICD-10-CM

## 2021-03-16 MED ORDER — NA SULFATE-K SULFATE-MG SULF 17.5-3.13-1.6 GM/177ML PO SOLN: 354.0000 mL | Freq: Once | ORAL | 0 refills | Status: AC

## 2021-03-16 NOTE — Patient Instructions (Signed)
[facility] [address], [place] [address].   Phone number [phone].   Please allow at least 24 hours before picking up the compounded cream because the pharmacy has to make the medication.  

## 2021-03-16 NOTE — Telephone Encounter (Signed)
   [facility] Pre-operative Risk Assessment    Request for surgical clearance:  What type of surgery is being performed? Colonoscopy  When is this surgery scheduled? 04/07/2021   Are there any medications that need to be held prior to surgery and how long?[place] name and name of physician performing surgery? [facility] Dr. Marius Ditch    What is your office phone and fax number? [phone]   Anesthesia type (None, local, MAC, general) ? general   Shelby Mattocks 03/16/2021, 3:41 PM  _________________________________________________________________   (provider comments below)

## 2021-03-16 NOTE — Progress Notes (Signed)
Arlyss Repress, MD [address]  [address]  [place], [place] [address]  Main: [phone]  Fax: [phone]    Gastroenterology Consultation  Referring Provider:     Lennon Alstrom, PA* Primary Care Physician:  Pcp, No Primary Gastroenterologist:  Dr. Arlyss Repress Reason for Consultation:     Rectal bleeding, intermittent rectal pain        HPI:   Adam Carter is a 45 y.o. male referred by Westley Foots, PA, cardiology for consultation & management of hematochezia.  Patient reports that he has been experiencing intermittent rectal bleeding, bright red blood per rectum on wiping.  He also reports intermittent rectal pain.  He denies constipation or diarrhea.  Most recent labs reveal normal CBC, CMP.  He has history of out-of-hospital cardiac arrest in March, s/p PCI with DES to ostial LAD on Brilinta and aspirin 81 mg daily, ischemic cardiomyopathy EF of 40 to 45%, closely followed by Dr. Kirke Corin.  Patient reports doing well from cardiac standpoint.  He quit smoking from the day of cardiac arrest.  He was smoking since age of 31  With regards to GI symptoms, other than rectal bleeding and mild rectal discomfort, he denies abdominal pain, weight loss, abdominal distention, nausea or vomiting.  He denies any other hemorrhoidal symptoms   NSAIDs: None  Antiplts/Anticoagulants/Anti thrombotics: Brilinta and aspirin 81 mg  GI Procedures: None He denies family history of GI malignancy  Past Medical History:  Diagnosis Date   CHF (congestive heart failure) (HCC)    Coronary artery disease    occluded ostial LAD with otherwise no obstructive disease.   PCI/DES to ostial LAD   HFrEF (heart failure with reduced ejection fraction) (HCC)    EF 30-35%   History of cardiac arrest    VF arrest 3/14   History of ST elevation myocardial infarction (STEMI)    09/07/20 anteriolateral STEMI   History of tobacco use    History of ventricular fibrillation    3/14   Hyperlipidemia  LDL goal <70     Past Surgical History:  Procedure Laterality Date   CARDIAC CATHETERIZATION     CORONARY/GRAFT ACUTE MI REVASCULARIZATION N/A 09/07/2020   Procedure: Coronary/Graft Acute MI Revascularization;  Surgeon: Iran Ouch, MD;  Location: [facility] INVASIVE CV LAB;  Service: Cardiovascular;  Laterality: N/A;   RIGHT/LEFT HEART CATH AND CORONARY ANGIOGRAPHY N/A 09/07/2020   Procedure: RIGHT/LEFT HEART CATH AND CORONARY ANGIOGRAPHY;  Surgeon: Iran Ouch, MD;  Location: [facility] INVASIVE CV LAB;  Service: Cardiovascular;  Laterality: N/A;    Current Outpatient Medications:    aspirin 81 MG chewable tablet, Chew 1 tablet (81 mg total) by mouth daily., Disp: 30 tablet, Rfl: 2   atorvastatin (LIPITOR) 80 MG tablet, Take 1 tablet (80 mg total) by mouth daily at 6 PM., Disp: 90 tablet, Rfl: 3   furosemide (LASIX) 20 MG tablet, Take 1 tablet (20 mg total) by mouth daily as needed., Disp: 90 tablet, Rfl: 0   Multiple Vitamin (MULTIVITAMIN WITH MINERALS) TABS tablet, Take 1 tablet by mouth daily., Disp: 30 tablet, Rfl: 0   Na Sulfate-K Sulfate-Mg Sulf 17.5-3.13-1.6 GM/177ML SOLN, Take 354 mLs by mouth once for 1 dose., Disp: 354 mL, Rfl: 0   pantoprazole (PROTONIX) 20 MG tablet, Take 1 tablet (20 mg total) by mouth daily., Disp: 30 tablet, Rfl: 5   sacubitril-valsartan (ENTRESTO) 24-26 MG, Take 1 tablet by mouth 2 (two) times daily., Disp: 60 tablet, Rfl: 3  spironolactone (ALDACTONE) 25 MG tablet, Take 1 tablet (25 mg total) by mouth daily., Disp: 30 tablet, Rfl: 2   ticagrelor (BRILINTA) 90 MG TABS tablet, Take 1 tablet (90 mg total) by mouth 2 (two) times daily., Disp: 60 tablet, Rfl: 3   carvedilol (COREG) 3.125 MG tablet, Take 1 tablet (3.125 mg total) by mouth 2 (two) times daily., Disp: 180 tablet, Rfl: 3    Family History  Problem Relation Age of Onset   Diabetes Father      Social History   Tobacco Use   Smoking status: Former    Types: Cigarettes    Quit date: 09/09/2020     Years since quitting: 0.5   Smokeless tobacco: Never  Vaping Use   Vaping Use: Never used  Substance Use Topics   Alcohol use: Not Currently    Comment: rare   Drug use: Never    Allergies as of 03/16/2021 - Review Complete 03/16/2021  Allergen Reaction Noted   Bacitracin-neomycin-polymyxin Hives 02/23/2018   Bupropion Nausea Only 12/03/2016    Review of Systems:    All systems reviewed and negative except where noted in HPI.   Physical Exam:  BP 113/73 (BP Location: Right Arm, Patient Position: Sitting, Cuff Size: Normal)   Pulse 65   Temp 97.6 F (36.4 C) (Oral)   Ht 5\' 7"  (1.702 m)   Wt 173 lb 12.8 oz (78.8 kg)   BMI 27.22 kg/m  No LMP for male patient.  General:   Alert,  Well-developed, well-nourished, pleasant and cooperative in NAD Head:  Normocephalic and atraumatic. Eyes:  Sclera clear, no icterus.   Conjunctiva pink. Ears:  Normal auditory acuity. Nose:  No deformity, discharge, or lesions. Mouth:  No deformity or lesions,oropharynx pink & moist. Neck:  Supple; no masses or thyromegaly. Lungs:  Respirations even and unlabored.  Clear throughout to auscultation.   No wheezes, crackles, or rhonchi. No acute distress. Heart:  Regular rate and rhythm; no murmurs, clicks, rubs, or gallops. Abdomen:  Normal bowel sounds. Soft, non-tender and non-distended without masses, hepatosplenomegaly or hernias noted.  No guarding or rebound tenderness.   Rectal: Small perianal skin tag, point tenderness in the posterior wall of anal canal consistent with anal fissure Msk:  Symmetrical without gross deformities. Good, equal movement & strength bilaterally. Pulses:  Normal pulses noted. Extremities:  No clubbing or edema.  No cyanosis. Neurologic:  Alert and oriented x3;  grossly normal neurologically. Skin:  Intact without significant lesions or rashes. No jaundice. Psych:  Alert and cooperative. Normal mood and affect.  Imaging Studies: None  Assessment and Plan:    Adam Carter is a 45 y.o. Caucasian male with history of coronary artery disease s/p MI, PCI with LAD on Brilinta and aspirin, ischemic cardiomyopathy EF of 40 to 45% is seen in consultation for 3 months history of intermittent rectal bleeding and rectal pain.  Rectal exam is consistent with posterior anal fissure  Anal fissure Recommend 0.125% nitroglycerin with 5% lidocaine.  With history of ischemic cardiomyopathy, instructed patient to start with once a day and if he is able to tolerate, increase to 2-3 times daily  Rectal bleeding Given his age, recommend diagnostic colonoscopy after obtaining cardiac clearance and clearance for interruption of antiplatelet therapy   Follow up based on the above work-up   59, MD

## 2021-03-17 NOTE — Telephone Encounter (Signed)
Called patient and patient verbalized understanding of instructions. Put recall in for 6 months office visit. Called endo and talk to [place] and cancel the procedure

## 2021-03-17 NOTE — Telephone Encounter (Signed)
   Patient Name: Adam Carter  DOB: 07-11-1975 MRN: [id number]  Primary Cardiologist: Lorine Bears, MD  Chart reviewed as part of pre-operative protocol coverage. Patient in need of colonoscopy due to hematochezia.and intermittent rectal pain per GI note yesterday. He suffered out of hospital cardiac arrest and anterior STEMI requiring PCI/DEs to LAD in 08/2020. GI requesting to hold Brilinta therefore need to involve Dr. Kirke Corin for decision making since he is less than 1 year out from PCI. Dr. Kirke Corin - Please route response to P CV DIV PREOP (the pre-op pool). Thank you.   Laurann [place], PA-C 03/17/2021, 10:04 AM

## 2021-03-17 NOTE — Telephone Encounter (Signed)
Will follow cardiology recommendations and wait until March 2023.  Please inform patient and cancel colonoscopy at this time and set up follow-up visit with me in 6 months  Thanks RV

## 2021-03-17 NOTE — Telephone Encounter (Signed)
Dr. Jari Sportsman recommendation reviewed. GI team already aware and they have notified patient. Will route to GI team to please reach out to cardiology team closer to time of anticipated re-schedule so we can address clearance then. Thank you!

## 2021-03-17 NOTE — Telephone Encounter (Signed)
I recommend no interruption in dual antiplatelet therapy at least until March 2023.  They can either do the procedure while taking dual antiplatelet therapy or postpone until March of 2023

## 2021-03-23 MED ORDER — ENTRESTO 24-26 MG PO TABS: 1.0000 | ORAL_TABLET | Freq: Two times a day (BID) | ORAL | 3 refills | Status: DC

## 2021-04-06 ENCOUNTER — Other Ambulatory Visit: Payer: Self-pay

## 2021-04-07 ENCOUNTER — Ambulatory Visit: Admit: 2021-04-07 | Payer: No Typology Code available for payment source | Admitting: Gastroenterology

## 2021-04-07 SURGERY — COLONOSCOPY WITH PROPOFOL
Anesthesia: General

## 2021-05-03 MED ORDER — ENTRESTO 24-26 MG PO TABS
1.0000 | ORAL_TABLET | Freq: Two times a day (BID) | ORAL | 2 refills | Status: DC
Start: 2021-05-03 — End: 2021-09-14

## 2021-05-06 ENCOUNTER — Other Ambulatory Visit: Payer: Self-pay

## 2021-05-06 MED ORDER — TICAGRELOR 90 MG PO TABS: 90.0000 mg | ORAL_TABLET | Freq: Two times a day (BID) | ORAL | 3 refills | Status: DC

## 2021-05-07 ENCOUNTER — Telehealth: Payer: Self-pay | Admitting: *Deleted

## 2021-05-07 NOTE — Telephone Encounter (Signed)
PA has been submitted via Covermymeds. Drug not covered by pt's plan. Preferred alternative is Clopidogrel Tablet. I have submitted PA to attempt approval. Awaiting response.  KEY: [id number] [employer] is reviewing your PA request. Typically an electronic response will be received within 24-72 hours. To check for an update later, open this request from your dashboard.  You may close this dialog and return to your dashboard to perform other tasks.

## 2021-05-10 NOTE — Telephone Encounter (Signed)
Per fax received from [employer], prior authorization is not required for Brilinta 90mg  tablets. This medication is on his plan's list of covered drugs. PA cancelled. Any questions please call [phone]  Reference # [id number]

## 2021-05-25 ENCOUNTER — Encounter: Payer: Self-pay | Admitting: Cardiovascular Disease

## 2021-05-25 ENCOUNTER — Ambulatory Visit (INDEPENDENT_AMBULATORY_CARE_PROVIDER_SITE_OTHER): Payer: No Typology Code available for payment source | Admitting: Cardiovascular Disease

## 2021-05-25 ENCOUNTER — Other Ambulatory Visit: Payer: Self-pay

## 2021-05-25 VITALS — BP 110/64 | HR 74 | Ht 67.0 in | Wt 173.2 lb

## 2021-05-25 DIAGNOSIS — I5022 Chronic systolic (congestive) heart failure: Secondary | ICD-10-CM | POA: Diagnosis not present

## 2021-05-25 DIAGNOSIS — E785 Hyperlipidemia, unspecified: Secondary | ICD-10-CM | POA: Diagnosis not present

## 2021-05-25 DIAGNOSIS — Z87891 Personal history of nicotine dependence: Secondary | ICD-10-CM

## 2021-05-25 DIAGNOSIS — I2510 Atherosclerotic heart disease of native coronary artery without angina pectoris: Secondary | ICD-10-CM | POA: Diagnosis not present

## 2021-05-25 NOTE — Progress Notes (Signed)
Cardiology Office Note   Date:  05/25/2021   ID:  Adam Carter, DOB May 02, 1976, MRN [id number]  PCP:  Pcp, No  Cardiologist:  Lorine Bears, MD   Chief Complaint  Patient presents with   Other    3 Month f/u pt would like to discuss if there is any support groups for PTSD. Pt having trouble w/ Depression/stress c/o right shoulder pain. Meds reviewed verbally with pt.      History of Present Illness: Adam Carter is a 45 y.o. male who presents for a follow-up visit regarding coronary artery disease.  The patient has history of prior tobacco use.  He presented in March of this year with out of hospital cardiac arrest due to anterior ST elevation myocardial infarction.  Emergent cardiac catheterization was performed which showed ostial occlusion of the LAD with no other obstructive disease.  Ejection fraction was 25 to 30%.  I performed successful angioplasty and drug-eluting stent placement to the ostial LAD.  Right heart catheterization was not suggestive of cardiogenic shock and he did not require mechanical support.  Hospital course was complicated by aspiration pneumonia and mental status changes but ultimately he was extubated with very good neurologic recovery.  He had a repeat echocardiogram done in May which showed an ejection fraction of 40 to 45%. He quit smoking since his cardiac event.   He has been doing well and denies any chest pain, shortness of breath or palpitations.  He is having significant anxiety and depression and having hard time dealing with stress overall.   Past Medical History:  Diagnosis Date   CHF (congestive heart failure) (HCC)    Coronary artery disease    occluded ostial LAD with otherwise no obstructive disease.   PCI/DES to ostial LAD   HFrEF (heart failure with reduced ejection fraction) (HCC)    EF 30-35%   History of cardiac arrest    VF arrest 3/14   History of ST elevation myocardial infarction (STEMI)    09/07/20 anteriolateral STEMI    History of tobacco use    History of ventricular fibrillation    3/14   Hyperlipidemia LDL goal <70    Tobacco abuse 12/03/2016    Past Surgical History:  Procedure Laterality Date   CARDIAC CATHETERIZATION     CORONARY/GRAFT ACUTE MI REVASCULARIZATION N/A 09/07/2020   Procedure: Coronary/Graft Acute MI Revascularization;  Surgeon: Iran Ouch, MD;  Location: [facility] INVASIVE CV LAB;  Service: Cardiovascular;  Laterality: N/A;   RIGHT/LEFT HEART CATH AND CORONARY ANGIOGRAPHY N/A 09/07/2020   Procedure: RIGHT/LEFT HEART CATH AND CORONARY ANGIOGRAPHY;  Surgeon: Iran Ouch, MD;  Location: [facility] INVASIVE CV LAB;  Service: Cardiovascular;  Laterality: N/A;     Current Outpatient Medications  Medication Sig Dispense Refill   aspirin 81 MG chewable tablet Chew 1 tablet (81 mg total) by mouth daily. 30 tablet 2   atorvastatin (LIPITOR) 80 MG tablet Take 1 tablet (80 mg total) by mouth daily at 6 PM. 90 tablet 3   carvedilol (COREG) 3.125 MG tablet Take 1 tablet (3.125 mg total) by mouth 2 (two) times daily. 180 tablet 3   furosemide (LASIX) 20 MG tablet Take 1 tablet (20 mg total) by mouth daily as needed. 90 tablet 0   Multiple Vitamin (MULTIVITAMIN WITH MINERALS) TABS tablet Take 1 tablet by mouth daily. 30 tablet 0   pantoprazole (PROTONIX) 20 MG tablet Take 1 tablet (20 mg total) by mouth daily. 30 tablet 5   sacubitril-valsartan (ENTRESTO) 24-26  MG Take 1 tablet by mouth 2 (two) times daily. 60 tablet 2   spironolactone (ALDACTONE) 25 MG tablet Take 1 tablet (25 mg total) by mouth daily. 30 tablet 2   ticagrelor (BRILINTA) 90 MG TABS tablet Take 1 tablet (90 mg total) by mouth 2 (two) times daily. 60 tablet 3   No current facility-administered medications for this visit.    Allergies:   Bacitracin-neomycin-polymyxin and Bupropion    Social History:  The patient  reports that he quit smoking about 8 months ago. His smoking use included cigarettes. He has never used smokeless  tobacco. He reports that he does not currently use alcohol. He reports that he does not use drugs.   Family History:  The patient's family history includes Diabetes in his father.    ROS:  Please see the history of present illness.   Otherwise, review of systems are positive for none.   All other systems are reviewed and negative.    PHYSICAL EXAM: VS:  BP 110/64 (BP Location: Left Arm, Patient Position: Sitting, Cuff Size: Normal)   Pulse 74   Ht 5\' 7"  (1.702 m)   Wt 173 lb 4 oz (78.6 kg)   SpO2 98%   BMI 27.13 kg/m  , BMI Body mass index is 27.13 kg/m. GEN: Well nourished, well developed, in no acute distress  HEENT: normal  Neck: no JVD, carotid bruits, or masses Cardiac: RRR; no murmurs, rubs, or gallops,no edema  Respiratory:  clear to auscultation bilaterally, normal work of breathing GI: soft, nontender, nondistended, + BS MS: no deformity or atrophy  Skin: warm and dry, no rash Neuro:  Strength and sensation are intact Psych: euthymic mood, full affect   EKG:  EKG is not ordered today.   Recent Labs: 09/12/2020: Magnesium 2.1 09/16/2020: B Natriuretic Peptide 119.7 11/30/2020: ALT 18 01/04/2021: BUN 8; Creatinine, Ser 0.87; Hemoglobin 13.0; Platelets 245; Potassium 3.7; Sodium 139    Lipid Panel    Component Value Date/Time   CHOL 87 11/30/2020 0731   TRIG 99 11/30/2020 0731   HDL 27 (L) 11/30/2020 0731   CHOLHDL 3.2 11/30/2020 0731   VLDL 20 11/30/2020 0731   LDLCALC 40 11/30/2020 0731      Wt Readings from Last 3 Encounters:  05/25/21 173 lb 4 oz (78.6 kg)  03/16/21 173 lb 12.8 oz (78.8 kg)  02/18/21 171 lb (77.6 kg)      No flowsheet data found.    ASSESSMENT AND PLAN:  1.  Coronary artery disease involving native coronary arteries without angina: He is doing very well with no anginal symptoms.  I encouraged him to start exercising on a regular basis.  Continue dual antiplatelet therapy at least until March 2023 and preferably longer given ostial  LAD stent location.  2.  Chronic systolic heart failure due to ischemic cardiomyopathy: He appears to be euvolemic without any diuretics.   Continue carvedilol, Entresto and spironolactone.    3.  Hyperlipidemia: I reviewed most recent lipid profile which showed an LDL of 40 which is at target.  Thus, continue current dose of atorvastatin.  4.  Previous tobacco use: Quit smoking after his myocardial infarction.  5.  Stress and anxiety: I discussed with him the importance of starting an exercise program.  Discussed methods of coping and had a prolonged discussion with him about expectations post myocardial infarction.  I offered short-term trial of medications such as sertraline but he prefers not to start any medications.   Disposition:  FU with me in 4 months  Signed,  Lorine Bears, MD  05/25/2021 4:14 PM    [facility]

## 2021-05-25 NOTE — Patient Instructions (Signed)
Medication Instructions:  °Your physician recommends that you continue on your current medications as directed. Please refer to the Current Medication list given to you today. ° °*If you need a refill on your cardiac medications before your next appointment, please call your pharmacy* ° ° °Lab Work: °None ordered °If you have labs (blood work) drawn today and your tests are completely normal, you will receive your results only by: °MyChart Message (if you have MyChart) OR °A paper copy in the mail °If you have any lab test that is abnormal or we need to change your treatment, we will call you to review the results. ° ° °Testing/Procedures: °None ordered ° ° °Follow-Up: °At [facility], you and your health needs are our priority.  As part of our continuing mission to provide you with exceptional heart care, we have created designated Provider Care Teams.  These Care Teams include your primary Cardiologist (physician) and Advanced Practice Providers (APPs -  Physician Assistants and Nurse Practitioners) who all work together to provide you with the care you need, when you need it. ° °We recommend signing up for the patient portal called "MyChart".  Sign up information is provided on this After Visit Summary.  MyChart is used to connect with patients for Virtual Visits (Telemedicine).  Patients are able to view lab/test results, encounter notes, upcoming appointments, etc.  Non-urgent messages can be sent to your provider as well.   °To learn more about what you can do with MyChart, go to [email or web address].   ° °Your next appointment:   °4 month(s) ° °The format for your next appointment:   °In Person ° °Provider:   °You may see Muhammad Arida, MD or one of the following Advanced Practice Providers on your designated Care Team:   °Christopher Berge, NP °Ryan Dunn, PA-C °Cadence Furth, PA-C{ ° ° ° ° °Other Instructions °N/A ° °

## 2021-05-30 ENCOUNTER — Encounter: Payer: Self-pay | Admitting: Cardiovascular Disease

## 2021-08-01 ENCOUNTER — Other Ambulatory Visit: Payer: Self-pay | Admitting: Cardiovascular Disease

## 2021-08-03 ENCOUNTER — Telehealth: Payer: Self-pay

## 2021-08-03 NOTE — Telephone Encounter (Signed)
His myocardial infarction was almost 1 year ago.  Thus, we can go ahead and switch him to Plavix 75 mg once daily.

## 2021-08-03 NOTE — Telephone Encounter (Signed)
Incoming fax from patients pharmacy.   Brilinta is not covered by patients plan -- Alternative Clopidogrel.

## 2021-08-04 ENCOUNTER — Encounter: Payer: Self-pay | Admitting: Cardiovascular Disease

## 2021-08-04 ENCOUNTER — Telehealth: Payer: Self-pay | Admitting: Licensed Clinical Social Worker

## 2021-08-04 MED ORDER — CLOPIDOGREL BISULFATE 75 MG PO TABS: 75.0000 mg | ORAL_TABLET | Freq: Every day | ORAL | 1 refills | Status: DC

## 2021-08-04 NOTE — Progress Notes (Signed)
Heart and Vascular Care Navigation  08/04/2021  Sneijder Strey 08-22-1975 [id number]  Reason for Referral:  Pt contacted [facility] as has left job- will lose insurance and income.  Engaged with patient by telephone for initial visit for Heart and Vascular Care Coordination.                                                                                                   Assessment:  LCSW called pt this morning at [phone], was able to reach him on first call. Introduced self, role, reason for call. Pt confirmed home address, that he has no current PCP, and his contact numbers. He asked for me to remove work number as he left his job last Thursday.   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 This switch was extremely challenging for him and took a toll on his mental health. He is currently living with his brother (always has been his mailing address but was previously staying in hotels while on work assignments). Pt mother is also a good support per his report. He has been working with HR at his former employer to figure out what leaving the job means moving forward for things like his benefits. He has filed for unemployment at this time but unsure about the outcome of that at this time. Pt understandably worried about medication access and cost of ongoing medical care.                                       LCSW shared that there are assistance programs available through pharmaceutical companies as well as [employer]ed[employer]ist that can help with cost. If pt has any upcoming appointments he also may be eligible for [employer]. I also offered to connect pt with assistance programs for cost of living expenses. Pt shares that he is interested in medication and care assistance. He does not pay rent/for utilities so he is not interested in those at this time. I went over a few more resources and pt is  interested in mental health resources and also will look into DSS assistance programs such as SNAP.   Pt confirmed I could mail resources to home address. He will reach out to HR and let me know when his benefits expire.    HRT/VAS Care Coordination     Patients Home Cardiology Office [facility]   Outpatient Care Team Social Worker   Social Worker Name: Nile Riggs, [facility] [phone]   Living arrangements for the past 2 months Single Family Home   Patient Current Insurance Coverage Commercial Insurance   Patient Has Concern With Paying Medical Bills Yes   Patient Concerns With Medical Bills soon to be uninsured, ongoing medical needs   Medical Bill Referrals: reached out to financial counselor re: CAFA;  referral to sliding scale PCP   Does Patient Have Prescription Coverage? Yes   Home Assistive Devices/Equipment None  DME Agency [employer]  Patient already has and is wearing it. Patient education material with patient. Demonstrated verbal understanding.   [place] Agency NA       Social History:                                                                             SDOH Screenings   Alcohol Screen: Not on file  Depression (PHQ2-9): Medium Risk   PHQ-2 Score: 5  Financial Resource Strain: High Risk   Difficulty of Paying Living Expenses: Hard  Food Insecurity: No Food Insecurity   Worried About Charity fundraiser in the Last Year: Never true   Ran Out of Food in the Last Year: Never true  Housing: Low Risk    Last Housing Risk Score: 0  Physical Activity: Not on file  Social Connections: Not on file  Stress: Stress Concern Present   Feeling of Stress : Very much  Tobacco Use: Medium Risk   Smoking Tobacco Use: Former   Smokeless Tobacco Use: Never   Passive Exposure: Not on file  Transportation Needs: No Transportation Needs   Lack of Transportation (Medical): No   Lack of Transportation (Non-Medical): No    SDOH Interventions: Financial  Resources:  Sales promotion account executive Interventions: Development worker, community, Other (Comment) ([place] for [employer]; sliding scale clinics) DSS for financial assistance  Food Insecurity:  Food Insecurity Interventions: Other (Comment) (mailed information about [place] and [employer] application)  Housing Insecurity:  Housing Interventions: Intervention Not Indicated  Transportation:   Transportation Interventions: Intervention Not Indicated    Other Care Navigation Interventions:     Provided Pharmacy assistance resources  Mailed [employer]edAssist application,   Patient expressed Mental Health concerns Yes, Referred to:  [place], [facility] [employer] and [place]   Follow-up plan:   LCSW will mail the following resources- this writers business card, Chief Operating Officer and [employer], [place] Navigator information should pt be interested in Insurance underwriter through [employer] guide from [employer], and [employer] application/supporting document information. I also sent in a separate envelope information and contact numbers for [facility] [place], [facility] [place] and [place]. Pt gave permission for me to f/u with [facility] as pt should establish with PCP for management of non cardiac related needs. I remain available and will f/u in the next 1-2 weeks

## 2021-08-04 NOTE — Telephone Encounter (Signed)
Spoke with the patient. Patient sts that he has a months supply of Brilinta on hand. He will complete his supply and then transition to Plavix 75 mg daily. Rx sent to the patients pharmacy. He will contact his pharmacy when he needs his Plavix filled.

## 2021-08-04 NOTE — Telephone Encounter (Signed)
Spoke with pt this morning, he has just left his job (as of last Thursday). He is not sure when his insurance will lapse but is going to talk to his HR department and get back in touch with me. At that time we can assist him with [employer] Patient Assistance Application, Entresto PAP and NCMedAssist. If he does switch, generic Plavix is covered by [facility].  Just wanted to let everyone know the above, feel free to send me an inbasket if needed.   Octavio Graves, MSW, LCSW Clinical Social Worker II [facility] Navigation  [phone]- work cell phone (preferred) [phone]- desk phone

## 2021-08-05 NOTE — Telephone Encounter (Signed)
Pt contacted this writer to let me know that his benefits will term 08/24/2021. I have sent resources listed in progress note to home address.   Westley Hummer, MSW, [place]  [phone]- work cell phone (preferred) [phone]- desk phone

## 2021-08-11 ENCOUNTER — Telehealth: Payer: Self-pay | Admitting: Cardiovascular Disease

## 2021-08-11 NOTE — Telephone Encounter (Addendum)
Returned the patients call to obtain more info.  Patient sts that he was previously told by Dr. Kirke Corin that he would not be able to work outside with extreme temps due to his cardiac medications. He was told that he should avoid prolonged exposure to heat or cold. He was also told that he should not lift more than 25 lbs.  Patient sts that he is currently unemployed and he is looking for work. He is currently working to get unemployment benefits. He sts that he may need turn down jobs due to theses restrictions.  He is requesting a letter stating his work restrictions.  Adv the pt that I will fwd msg to Dr. Kirke Corin to advise.

## 2021-08-11 NOTE — Telephone Encounter (Signed)
Pt c/o medication issue:  1. Name of Medication: All heart medications   2. How are you currently taking this medication (dosage and times per day)? Twice a day  3. Are you having a reaction (difficulty breathing--STAT)? no  4. What is your medication issue? Needs information on limitations due job performance

## 2021-08-13 ENCOUNTER — Encounter: Payer: Self-pay | Admitting: *Deleted

## 2021-08-13 NOTE — Telephone Encounter (Signed)
Work restrictions become less with time.  Most of the restrictions are for the 6 to 12 months after his myocardial infarction.  Given that he has been stable from a cardiac standpoint since his myocardial infarction, the restrictions can be modified to the following: No lifting of more than 40 pounds. No work outside if temperature is greater than 85 or below 40 degrees.  These restrictions might be ultimately eliminated in the future based on his progress.

## 2021-08-13 NOTE — Telephone Encounter (Signed)
I spoke with the patient and read him Dr. Tyrell Antonio recommendations as stated below.  The patient voices understanding of these recommendations. He is asking for a letter to be mailed to him stating his current restrictions.  I have advised him I will forward him a MyChart message of Dr. Tyrell Antonio comments below and will construct a letter for him. He is aware this will also drop into his MyChart, but will mail him a copy also.  Mailing address on file confirmed with the patient.

## 2021-08-24 ENCOUNTER — Telehealth: Payer: Self-pay | Admitting: Licensed Clinical Social Worker

## 2021-08-24 NOTE — Telephone Encounter (Signed)
LCSW called pt to f/u on applications that had been sent as his current insurance terms today. Pt shared he has received those applications except the [employer]. Pt also received the other supportive resources we had discussed. I shared how to complete the [facility] application and encouraged him to do so first so he could have his eligible medications mailed to him. I then recommended that he complete the [employer] which I will place in the mail today. Pt cannot apply for CAFA until after 4/4 as he does not have any current balances it would apply to. Pt aware, encouraged to f/u with me as he completes applications. Provided support for pt as we briefly discussed challenges w/finding a new job with his current MD restrictions and that often times when applying to jobs in his field they are requesting skills that he is not well versed in. Shared encouragement that pt is continuing to persist in applying and acknowledgement for the frustration that job searches bring. I remain available for questions/concerns and will f/u again with pt next week if I do not hear from him before that time.   Octavio Graves, MSW, LCSW Clinical Social Worker II [facility] Navigation  [phone]- work cell phone (preferred) [phone]- desk phone

## 2021-08-26 ENCOUNTER — Telehealth: Payer: Self-pay | Admitting: Licensed Clinical Social Worker

## 2021-08-26 NOTE — Telephone Encounter (Signed)
Pt reached out to let me know that despite having a [place] address that he has a [facility] residency. LCSW shared that pt would just need to notate that on the forms but no new applications needed. Pt states understanding. Also has an additional question regarding his tax return since it is 105 pages. LCSW has sent inquiry to [facility] about that to see if full return needed, I will also reach out to customer service to see what is needed. Remain available and will f/u with pt when I have further clarification.  ? ?Octavio Graves, MSW, LCSW ?Clinical Social Worker II ?[place] [facility] Navigation  ?[phone]- work cell phone (preferred) ?[phone]- desk phone ? ?

## 2021-08-30 ENCOUNTER — Telehealth: Payer: Self-pay | Admitting: Licensed Clinical Social Worker

## 2021-08-30 NOTE — Telephone Encounter (Signed)
Return texts sent to pt at [phone].  ?Clarified that NCMedAssist will only need first 2 pages of 1040 and any additional schedules from tax return. [facility] Financial Assistance will need the full return. If pt is able to securely send it to me electronically to submit to patient accounting representative electronically when the rest of the application is completed. I remain available.  ? ?Octavio Graves, MSW, LCSW ?Clinical Social Worker II ?[place] [facility] Navigation  ?[phone]- work cell phone (preferred) ?[phone]- desk phone ? ?

## 2021-08-31 ENCOUNTER — Telehealth: Payer: Self-pay | Admitting: Licensed Clinical Social Worker

## 2021-08-31 NOTE — Telephone Encounter (Signed)
LCSW received message from pt inquiring if he could get Entresto samples. LCSW shared that he would need to speak with nursing staff at Dr. Jari Sportsman office about that, encouraged him to complete [facility] application as it would cover his Sherryll Burger if eligible. Pt states understanding. ? ?Octavio Graves, MSW, LCSW ?Clinical Social Worker II ?[place] [facility] Navigation  ?[phone]- work cell phone (preferred) ?[phone]- desk phone ? ?

## 2021-09-07 ENCOUNTER — Telehealth: Payer: Self-pay | Admitting: Licensed Clinical Social Worker

## 2021-09-07 NOTE — Telephone Encounter (Signed)
LCSW received applications and supporting documents from patient. Upon review all needed documents attached. LCSW has made copies. Mailed [employer] Assistance to business office and scanned/securely emailed pt NCMedAssist application for review.  ? ?I will f/u, I remain available. If NCMedAssist application approved then I will contact [place] office to transition medications.  ? ?Westley Hummer, MSW, LCSW ?Clinical Social Worker II ?[place] [facility] Navigation  ?[phone]- work cell phone (preferred) ?[phone]- desk phone ? ?

## 2021-09-07 NOTE — Telephone Encounter (Signed)
LCSW received a call from pt [phone]). Pt shares that he has completed applications he believes fully. Inquired the best way to get them to me. I shared that he can either scan them to my email, he can mail them directly, or he can bring them to office (acknowledging the distance). Pt shares that he has time to bring them to office today. He will email me his tax return as well in case the portion he brings does not work for application. LCSW has texted pt address to [facility] office as well as my email.  ? ?Received email, I have let front desk know that pt will be bringing these items by the office today we well. I will review them and mail them if complete.  ? ?Octavio Graves, MSW, LCSW ?Clinical Social Worker II ?[place] [facility] Navigation  ?[phone]- work cell phone (preferred) ?[phone]- desk phone ? ?

## 2021-09-13 ENCOUNTER — Telehealth: Payer: Self-pay | Admitting: Licensed Clinical Social Worker

## 2021-09-13 NOTE — Telephone Encounter (Signed)
Approved for NCMedAssist- the following medications should be sent to [facility] [place] on Epic.  ?- aspirin ?- atorvastatin calcium ?- carvedilol ?- clopidogrel bisulfate ?- furosemide ?- sacubitril-valsartan ?- spironolactone ? ?I will reach out to Dr. Jari Sportsman office to request this change and update pt.  ? ?Octavio Graves, MSW, LCSW ?Clinical Social Worker II ?[place] [facility] Navigation  ?[phone]- work cell phone (preferred) ?[phone]- desk phone ? ?

## 2021-09-13 NOTE — Telephone Encounter (Signed)
LCSW able to reach pt at [phone], provided update regarding approval of NCMedAssist application. All needed medications for shifting to [facility] pharmacy have been sent to Dr. Jari Sportsman team. Pt provided with [facility] pharmacy number for any additional questions/concerns. There may be a slight delay with shipments after medications moved to their pharmacy.  ? ?Additional update given that pt CAFA [facility] Financial Aid program) application has not been processed yet. In order for it to be considered he will need to attend appt on 4/4, pt states understanding. I have added notes that application in process to appt notes, pt should let them know at check in that application submitted. ? ?He was encouraged to contact me if any additional questions/concerns about the above or if we can be of any additional assistance w/ other needs. I will continue to check account for updates as available.  ? ? ?Adam Carter, MSW, LCSW ?Clinical Social Worker II ?[place] [facility] Navigation  ?[phone]- work cell phone (preferred) ?[phone]- desk phone ? ?

## 2021-09-14 ENCOUNTER — Telehealth: Payer: Self-pay | Admitting: Licensed Clinical Social Worker

## 2021-09-14 MED ORDER — ENTRESTO 24-26 MG PO TABS: 1.0000 | ORAL_TABLET | Freq: Two times a day (BID) | ORAL | 1 refills | Status: DC

## 2021-09-14 MED ORDER — ATORVASTATIN CALCIUM 80 MG PO TABS: 80.0000 mg | ORAL_TABLET | Freq: Every day | ORAL | 1 refills | Status: DC

## 2021-09-14 MED ORDER — CARVEDILOL 3.125 MG PO TABS: 3.1250 mg | ORAL_TABLET | Freq: Two times a day (BID) | ORAL | 1 refills | Status: DC

## 2021-09-14 MED ORDER — ASPIRIN EC 81 MG PO TBEC: 81.0000 mg | DELAYED_RELEASE_TABLET | Freq: Every day | ORAL | 1 refills | Status: DC

## 2021-09-14 MED ORDER — FUROSEMIDE 20 MG PO TABS: 20.0000 mg | ORAL_TABLET | Freq: Every day | ORAL | 1 refills | Status: DC | PRN

## 2021-09-14 MED ORDER — SPIRONOLACTONE 25 MG PO TABS: 25.0000 mg | ORAL_TABLET | Freq: Every day | ORAL | 1 refills | Status: DC

## 2021-09-14 MED ORDER — CLOPIDOGREL BISULFATE 75 MG PO TABS: 75.0000 mg | ORAL_TABLET | Freq: Every day | ORAL | 1 refills | Status: DC

## 2021-09-14 NOTE — Telephone Encounter (Signed)
I sent pt a message to make him aware that the medications have been electronically sent to [facility]. There will likely be a delay in medications being mailed to pt, he is aware and has pharmacy number to call. ? ?Octavio Graves, MSW, LCSW ?Clinical Social Worker II ? Heart/Vascular Care Navigation  ?[phone]- work cell phone (preferred) ?[phone]- desk phone ? ?

## 2021-09-14 NOTE — Addendum Note (Signed)
Addended by: Jarvis Newcomer on: 09/14/2021 07:30 AM ? ? Modules accepted: Orders ? ?

## 2021-09-14 NOTE — Telephone Encounter (Signed)
Prescriptions for the patients cardiac medications sent to [facility] on Epic as requested. ?

## 2021-09-17 ENCOUNTER — Encounter: Payer: Self-pay | Admitting: Cardiovascular Disease

## 2021-09-17 ENCOUNTER — Telehealth: Payer: Self-pay | Admitting: Licensed Clinical Social Worker

## 2021-09-17 NOTE — Telephone Encounter (Signed)
Pt contacted this writer to let me know that his medications had been delivered from [facility] but it did not include Entresto, he also got an estimate on his MyChart and wasn't sure what that meant.  ? ?LCSW encouraged pt to call NCMedAssist to clarify whether or not he could receive medication. I shared that I cannot see what the pt is referring to on myChart but when patients are uninsured they according to federal guidelines must receive an estimate before any services are rendered. Pt states understanding.  ? ?He then contacted NCMedAssist- Entresto only for [place] residents at this time. Pt will need to complete [employer] PAP. I have mailed application noting where pt needs to complete his portion, pt had brought first two pages of 1040 in for other applications. This was attached to application along with instructions for him to bring to appt w/ Dr. Kirke Corin. Pt aware, I  remain available.   ? ?Octavio Graves, MSW, LCSW ?Clinical Social Worker II ?[place] [facility] Navigation  ?[phone]- work cell phone (preferred) ?[phone]- desk phone ? ?

## 2021-09-28 ENCOUNTER — Ambulatory Visit (INDEPENDENT_AMBULATORY_CARE_PROVIDER_SITE_OTHER): Payer: No Typology Code available for payment source | Admitting: Cardiovascular Disease

## 2021-09-28 ENCOUNTER — Encounter: Payer: Self-pay | Admitting: Cardiovascular Disease

## 2021-09-28 VITALS — BP 100/68 | HR 77 | Ht 67.0 in | Wt 176.3 lb

## 2021-09-28 DIAGNOSIS — Z87891 Personal history of nicotine dependence: Secondary | ICD-10-CM

## 2021-09-28 DIAGNOSIS — E785 Hyperlipidemia, unspecified: Secondary | ICD-10-CM

## 2021-09-28 DIAGNOSIS — I2510 Atherosclerotic heart disease of native coronary artery without angina pectoris: Secondary | ICD-10-CM

## 2021-09-28 DIAGNOSIS — I5022 Chronic systolic (congestive) heart failure: Secondary | ICD-10-CM

## 2021-09-28 MED ORDER — ENTRESTO 24-26 MG PO TABS
1.0000 | ORAL_TABLET | Freq: Two times a day (BID) | ORAL | 3 refills | Status: DC
Start: 2021-09-28 — End: 2022-07-12

## 2021-09-28 NOTE — Patient Instructions (Addendum)
Medication Instructions:  ?Your physician recommends that you continue on your current medications as directed. Please refer to the Current Medication list given to you today. ? ?*If you need a refill on your cardiac medications before your next appointment, please call your pharmacy* ? ? ?Lab Work: ?None ordered ?If you have labs (blood work) drawn today and your tests are completely normal, you will receive your results only by: ?MyChart Message (if you have MyChart) OR ?A paper copy in the mail ?If you have any lab test that is abnormal or we need to change your treatment, we will call you to review the results. ? ? ?Testing/Procedures: ?None ordered ? ? ?Follow-Up: ?At [facility], you and your health needs are our priority.  As part of our continuing mission to provide you with exceptional heart care, we have created designated Provider Care Teams.  These Care Teams include your primary Cardiologist (physician) and Advanced Practice Providers (APPs -  Physician Assistants and Nurse Practitioners) who all work together to provide you with the care you need, when you need it. ? ?We recommend signing up for the patient portal called "MyChart".  Sign up information is provided on this After Visit Summary.  MyChart is used to connect with patients for Virtual Visits (Telemedicine).  Patients are able to view lab/test results, encounter notes, upcoming appointments, etc.  Non-urgent messages can be sent to your provider as well.   ?To learn more about what you can do with MyChart, go to [email or web address].   ? ?Your next appointment:   ?Your physician wants you to follow-up in: 6 months You will receive a reminder letter in the mail two months in advance. If you don't receive a letter, please call our office to schedule the follow-up appointment. ? ? ?The format for your next appointment:   ?In Person ? ?Provider:   ?You may see Lorine Bears, MD or one of the following Advanced Practice Providers on your  designated Care Team:   ?Nicolasa Ducking, NP ?Eula Listen, PA-C ?Cadence Fransico Michael, PA-C ? ? ?Other Instructions ?Medication Samples have been provided to the patient. ? ?Drug name: Sherryll Burger        ?Strength: 24/26 mg         ?Qty: 1 bottle   ?LOT: [id number]   ?Exp.Date: 09/2023 ? ?Dosing instructions: 1 tablet by mouth twice a day ?

## 2021-09-28 NOTE — Progress Notes (Signed)
?  ?Cardiology Office Note ? ? ?Date:  09/28/2021  ? ?ID:  Adam Carter, DOB [birth year], MRN [id number] ? ?PCP:  Pcp, No  ?Cardiologist:  Lorine Bears, MD  ? ?Chief Complaint  ?Patient presents with  ? Other  ?  4 month f/u pt would like to discuss Entresto due to cost. Pt recently lost his job and can't afford meds at this time. Meds reviewed verbally with pt.  ? ? ?  ?History of Present Illness: ?Adam Carter is a 46 y.o. male who presents for a follow-up visit regarding coronary artery disease. ? ?The patient has history of prior tobacco use.  He presented in March of this year with out of hospital cardiac arrest due to anterior ST elevation myocardial infarction.  Emergent cardiac catheterization was performed which showed ostial occlusion of the LAD with no other obstructive disease.  Ejection fraction was 25 to 30%.  I performed successful angioplasty and drug-eluting stent placement to the ostial LAD.  Right heart catheterization was not suggestive of cardiogenic shock and he did not require mechanical support.  Hospital course was complicated by aspiration pneumonia and mental status changes but ultimately he was extubated with very good neurologic recovery.  He had a repeat echocardiogram done in May which showed an ejection fraction of 40 to 45%. ?He quit smoking since his cardiac event.   ? ?He had significant anxiety and depression after his cardiac event.  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  He is currently looking for a job.  He lost his health insurance.  He denies any chest pain or shortness of breath.  He was able to get his medications via [facility].  However, he has not been able to get Entresto. ? ?Past Medical History:  ?Diagnosis Date  ? CHF (congestive heart failure) (HCC)   ? Coronary artery disease   ? occluded ostial LAD with otherwise no obstructive disease.   PCI/DES to ostial LAD  ? HFrEF (heart failure with reduced ejection fraction) (HCC)   ? EF  30-35%  ? History of cardiac arrest   ? VF arrest 3/14  ? History of ST elevation myocardial infarction (STEMI)   ? 09/07/20 anteriolateral STEMI  ? History of tobacco use   ? History of ventricular fibrillation   ? 3/14  ? Hyperlipidemia LDL goal <70   ? Tobacco abuse 12/03/2016  ? ? ?Past Surgical History:  ?Procedure Laterality Date  ? CARDIAC CATHETERIZATION    ? CORONARY/GRAFT ACUTE MI REVASCULARIZATION N/A 09/07/2020  ? Procedure: Coronary/Graft Acute MI Revascularization;  Surgeon: Iran Ouch, MD;  Location: [facility] INVASIVE CV LAB;  Service: Cardiovascular;  Laterality: N/A;  ? RIGHT/LEFT HEART CATH AND CORONARY ANGIOGRAPHY N/A 09/07/2020  ? Procedure: RIGHT/LEFT HEART CATH AND CORONARY ANGIOGRAPHY;  Surgeon: Iran Ouch, MD;  Location: [facility] INVASIVE CV LAB;  Service: Cardiovascular;  Laterality: N/A;  ? ? ? ?Current Outpatient Medications  ?Medication Sig Dispense Refill  ? aspirin EC 81 MG tablet Take 1 tablet (81 mg total) by mouth daily. Swallow whole. 90 tablet 1  ? atorvastatin (LIPITOR) 80 MG tablet Take 1 tablet (80 mg total) by mouth daily at 6 PM. 90 tablet 1  ? carvedilol (COREG) 3.125 MG tablet Take 1 tablet (3.125 mg total) by mouth 2 (two) times daily. 180 tablet 1  ? clopidogrel (PLAVIX) 75 MG tablet Take 1 tablet (75 mg total) by mouth daily. 90 tablet 1  ? furosemide (LASIX) 20  MG tablet Take 1 tablet (20 mg total) by mouth daily as needed. 90 tablet 1  ? Multiple Vitamin (MULTIVITAMIN WITH MINERALS) TABS tablet Take 1 tablet by mouth daily. 30 tablet 0  ? spironolactone (ALDACTONE) 25 MG tablet Take 1 tablet (25 mg total) by mouth daily. 90 tablet 1  ? pantoprazole (PROTONIX) 20 MG tablet Take 1 tablet (20 mg total) by mouth daily. (Patient not taking: Reported on 09/28/2021) 30 tablet 5  ? sacubitril-valsartan (ENTRESTO) 24-26 MG Take 1 tablet by mouth 2 (two) times daily. (Patient not taking: Reported on 09/28/2021) 180 tablet 1  ? ?No current facility-administered medications for this  visit.  ? ? ?Allergies:   Bacitracin-neomycin-polymyxin and Bupropion  ? ? ?Social History:  The patient  reports that he quit smoking about 12 months ago. His smoking use included cigarettes. He has never used smokeless tobacco. He reports that he does not currently use alcohol. He reports that he does not use drugs.  ? ?Family History:  The patient's family history includes Diabetes in his father.  ? ? ?ROS:  Please see the history of present illness.   Otherwise, review of systems are positive for none.   All other systems are reviewed and negative.  ? ? ?PHYSICAL EXAM: ?VS:  BP 100/68 (BP Location: Left Arm, Patient Position: Sitting, Cuff Size: Normal)   Pulse 77   Ht 5\' 7"  (1.702 m)   Wt 176 lb 5 oz (80 kg)   SpO2 98%   BMI 27.61 kg/m?  , BMI Body mass index is 27.61 kg/m?. ?GEN: Well nourished, well developed, in no acute distress  ?HEENT: normal  ?Neck: no JVD, carotid bruits, or masses ?Cardiac: RRR; no murmurs, rubs, or gallops,no edema  ?Respiratory:  clear to auscultation bilaterally, normal work of breathing ?GI: soft, nontender, nondistended, + BS ?MS: no deformity or atrophy  ?Skin: warm and dry, no rash ?Neuro:  Strength and sensation are intact ?Psych: euthymic mood, full affect ? ? ?EKG:  EKG is ordered today. ?Normal sinus rhythm with low voltage and prior septal infarct. ? ?Recent Labs: ?11/30/2020: ALT 18 ?01/04/2021: BUN 8; Creatinine, Ser 0.87; Hemoglobin 13.0; Platelets 245; Potassium 3.7; Sodium 139  ? ? ?Lipid Panel ?   ?Component Value Date/Time  ? CHOL 87 11/30/2020 0731  ? TRIG 99 11/30/2020 0731  ? HDL 27 (L) 11/30/2020 0731  ? CHOLHDL 3.2 11/30/2020 0731  ? VLDL 20 11/30/2020 0731  ? LDLCALC 40 11/30/2020 0731  ? ?  ? ?Wt Readings from Last 3 Encounters:  ?09/28/21 176 lb 5 oz (80 kg)  ?05/25/21 173 lb 4 oz (78.6 kg)  ?03/16/21 173 lb 12.8 oz (78.8 kg)  ?  ? ? ?   ? View : No data to display.  ?  ?  ?  ? ? ? ? ?ASSESSMENT AND PLAN: ? ?1.  Coronary artery disease involving native  coronary arteries without angina: He is doing very well with no anginal symptoms.  Continue dual antiplatelet therapy long-term as tolerated given  ostial LAD stent location. ? ?2.  Chronic systolic heart failure due to ischemic cardiomyopathy: He appears to be euvolemic without any diuretics.   Continue carvedilol, Entresto and spironolactone.  He was given samples of Entresto and will fill up the assistance application for him.  If [facility] not [facility], we will plan on switching him to an ACE inhibitor or ARB. ? ?3.  Hyperlipidemia: I reviewed most recent lipid profile which showed an  LDL of 40 which is at target.  Thus, continue current dose of atorvastatin. ? ?4.  Previous tobacco use: Quit smoking after his myocardial infarction. ? ? ? ?Disposition:   FU with me in 6 months ? ?Signed, ? ?Lorine Bears, MD  ?09/28/2021 4:14 PM    ?[facility] ? ?

## 2021-09-29 ENCOUNTER — Telehealth: Payer: Self-pay | Admitting: Cardiovascular Disease

## 2021-09-29 NOTE — Telephone Encounter (Signed)
Patient assistance application for [facility] faxed to [employer]. ?Fax confirmation received. ?Application filed in med sample room. ?

## 2021-09-30 NOTE — Telephone Encounter (Signed)
Approval letter received from [employer] ? ?Patient has been approved to receive Entresto at no cost through 10/01/22. ? ?Called the patient and made him aware. Adv the patient that he should also receive a copy of this letter in the mail. ? ?Patient voiced appreciation for the assistance. ?

## 2021-10-01 NOTE — Telephone Encounter (Signed)
That's great! Thanks

## 2021-10-04 ENCOUNTER — Telehealth: Payer: Self-pay | Admitting: Licensed Clinical Social Worker

## 2021-10-04 NOTE — Telephone Encounter (Signed)
LCSW received a call from pt.  ?He shares that he received bill from his most recent visit with [facility]. Pt was confused by the uninsured discount and had paid money towards his bill. He now is aware that his CAFA is still pending. LCSW provided the number for patient accounting so he can speak with them about his pending application and perhaps get credit back on his account if approved. Pt appreciative of assistance, aware that his application has been received and per pt accounting is still being processed.  ? ?Westley Hummer, MSW, LCSW ?Clinical Social Worker II ?[place] [facility] Navigation  ?[phone]- work cell phone (preferred) ?[phone]- desk phone ? ?

## 2021-10-12 ENCOUNTER — Telehealth: Payer: Self-pay | Admitting: Licensed Clinical Social Worker

## 2021-10-12 NOTE — Telephone Encounter (Signed)
Pt CAFA was processed. Is missing 401K statement, I contacted pt to let him know such. He has securely emailed me his statement. I have contacted Mellody Dance with Patient Accounting to see how best to get this statement to him for final processing of assistance.  ? ?Adam Carter, MSW, LCSW ?Clinical Social Worker II ?[place] [facility] Navigation  ?[phone]- work cell phone (preferred) ?[phone]- desk phone ? ?

## 2021-10-13 ENCOUNTER — Telehealth: Payer: Self-pay | Admitting: Licensed Clinical Social Worker

## 2021-10-13 NOTE — Telephone Encounter (Signed)
LCSW securely emailed pt statement for CAFA to Roselind Rily in Patient Accounting.  ?He will add to pt CAFA and continue processing for determination.  ? ?Octavio Graves, MSW, LCSW ?Clinical Social Worker II ?[place] [facility] Navigation  ?[phone]- work cell phone (preferred) ?[phone]- desk phone ? ?

## 2021-10-15 ENCOUNTER — Telehealth: Payer: Self-pay | Admitting: Licensed Clinical Social Worker

## 2021-10-15 NOTE — Telephone Encounter (Signed)
PATIENT APPROVED FOR 100% FINANCIAL ASSISTANCE PER CAFA APPLICATION AND SUPPORTING DOCUMENTATION.  ?CAFA APP SCANNED TO ACCOUNT [id number] ?APPROVAL DATES OF [employer] ---- 09/01/21 - 03/04/22  ?APPROVAL LETTER ON ACCOUNT [id number] ? ?I contacted pt to let him know the above approval.  ?Mailed and emailed pt a copy of approval letter at his request- encouraged him to bring to all appointments with [facility] providers. Will need to reapply if still eligible on or after 03/05/2022. Pt aware, encouraged to f/u if any additional questions/concerns.  ? ?Octavio Graves, MSW, LCSW ?Clinical Social Worker II ? Heart/Vascular Care Navigation  ?[phone]- work cell phone (preferred) ?[phone]- desk phone ? ?

## 2021-10-21 ENCOUNTER — Telehealth: Payer: Self-pay | Admitting: Licensed Clinical Social Worker

## 2021-10-21 NOTE — Telephone Encounter (Signed)
LCSW sent pt message updating him that I will mail back original documents since he was approved for CAFA. Pt confirmed receipt of message and gives his permission to send that to home address.  ? ?Westley Hummer, MSW, LCSW ?Clinical Social Worker II ?[place] [facility] Navigation  ?[phone]- work cell phone (preferred) ?[phone]- desk phone ? ?

## 2021-12-05 ENCOUNTER — Encounter: Payer: Self-pay | Admitting: Cardiovascular Disease

## 2021-12-09 NOTE — Telephone Encounter (Signed)
Yes, that is fine from a cardiac standpoint.

## 2021-12-12 IMAGING — DX DG CHEST 1V PORT
1 series · 1 of 1 positions shown · non-contrast
Comparison: 09/08/2020

CLINICAL DATA: Acute respiratory failure with hypoxia.

EXAM:
PORTABLE CHEST 1 VIEW

[chest ap]
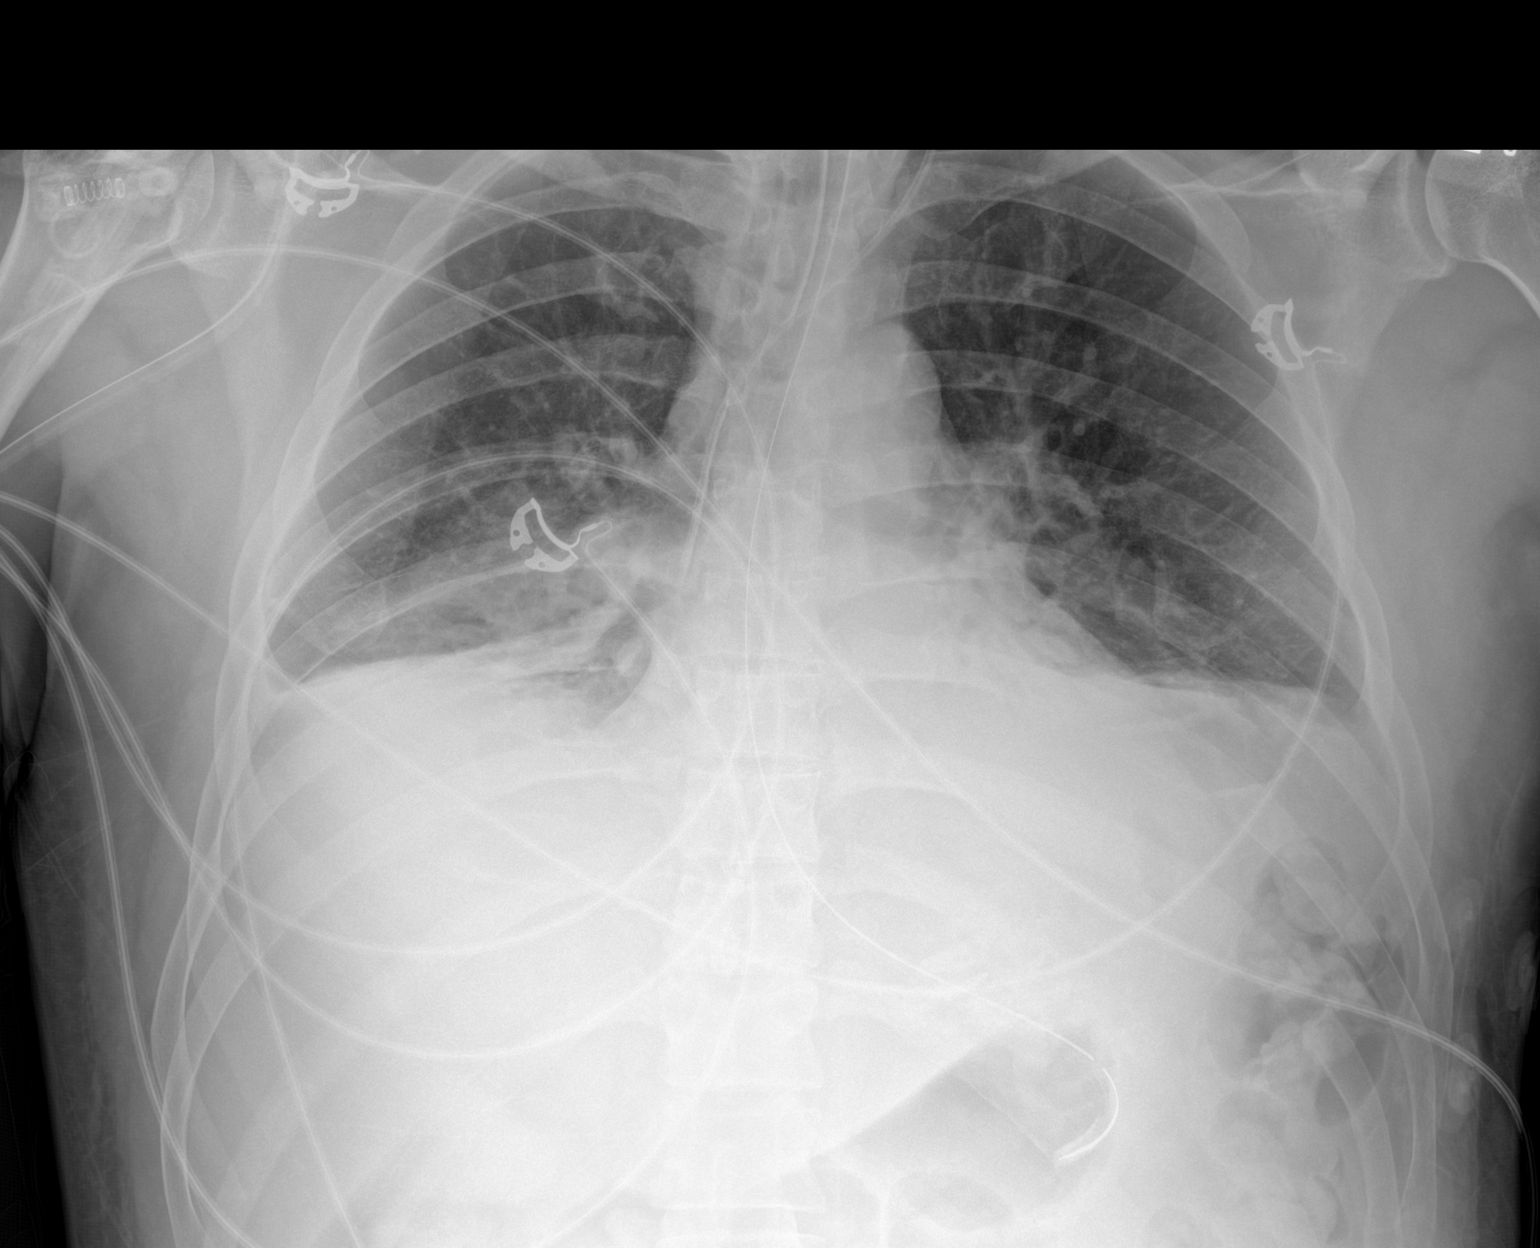

[1 of 1 positions shown; findings below may reference images not displayed]

FINDINGS: Endotracheal tube in good position. Left jugular central venous
catheter at the cavoatrial junction. NG tube in the stomach

Hypoventilation. Decreased lung volumes with bibasilar atelectasis,
with mild improvement in aeration since the prior study. Minimal
pleural effusion bilaterally.
IMPRESSION: Hypoventilation. Bibasilar atelectasis with mild interval
improvement in aeration.

## 2021-12-15 IMAGING — DX DG CHEST 1V PORT
1 series · 2 of 2 positions shown · non-contrast
Comparison: Chest radiograph dated 09/11/2020.

CLINICAL DATA: Status post cardiac arrest.

EXAM:
PORTABLE CHEST 1 VIEW

[Series 1: chest ap · 0.14mm/px · 2 of 2 slices shown]
[im 1/2]
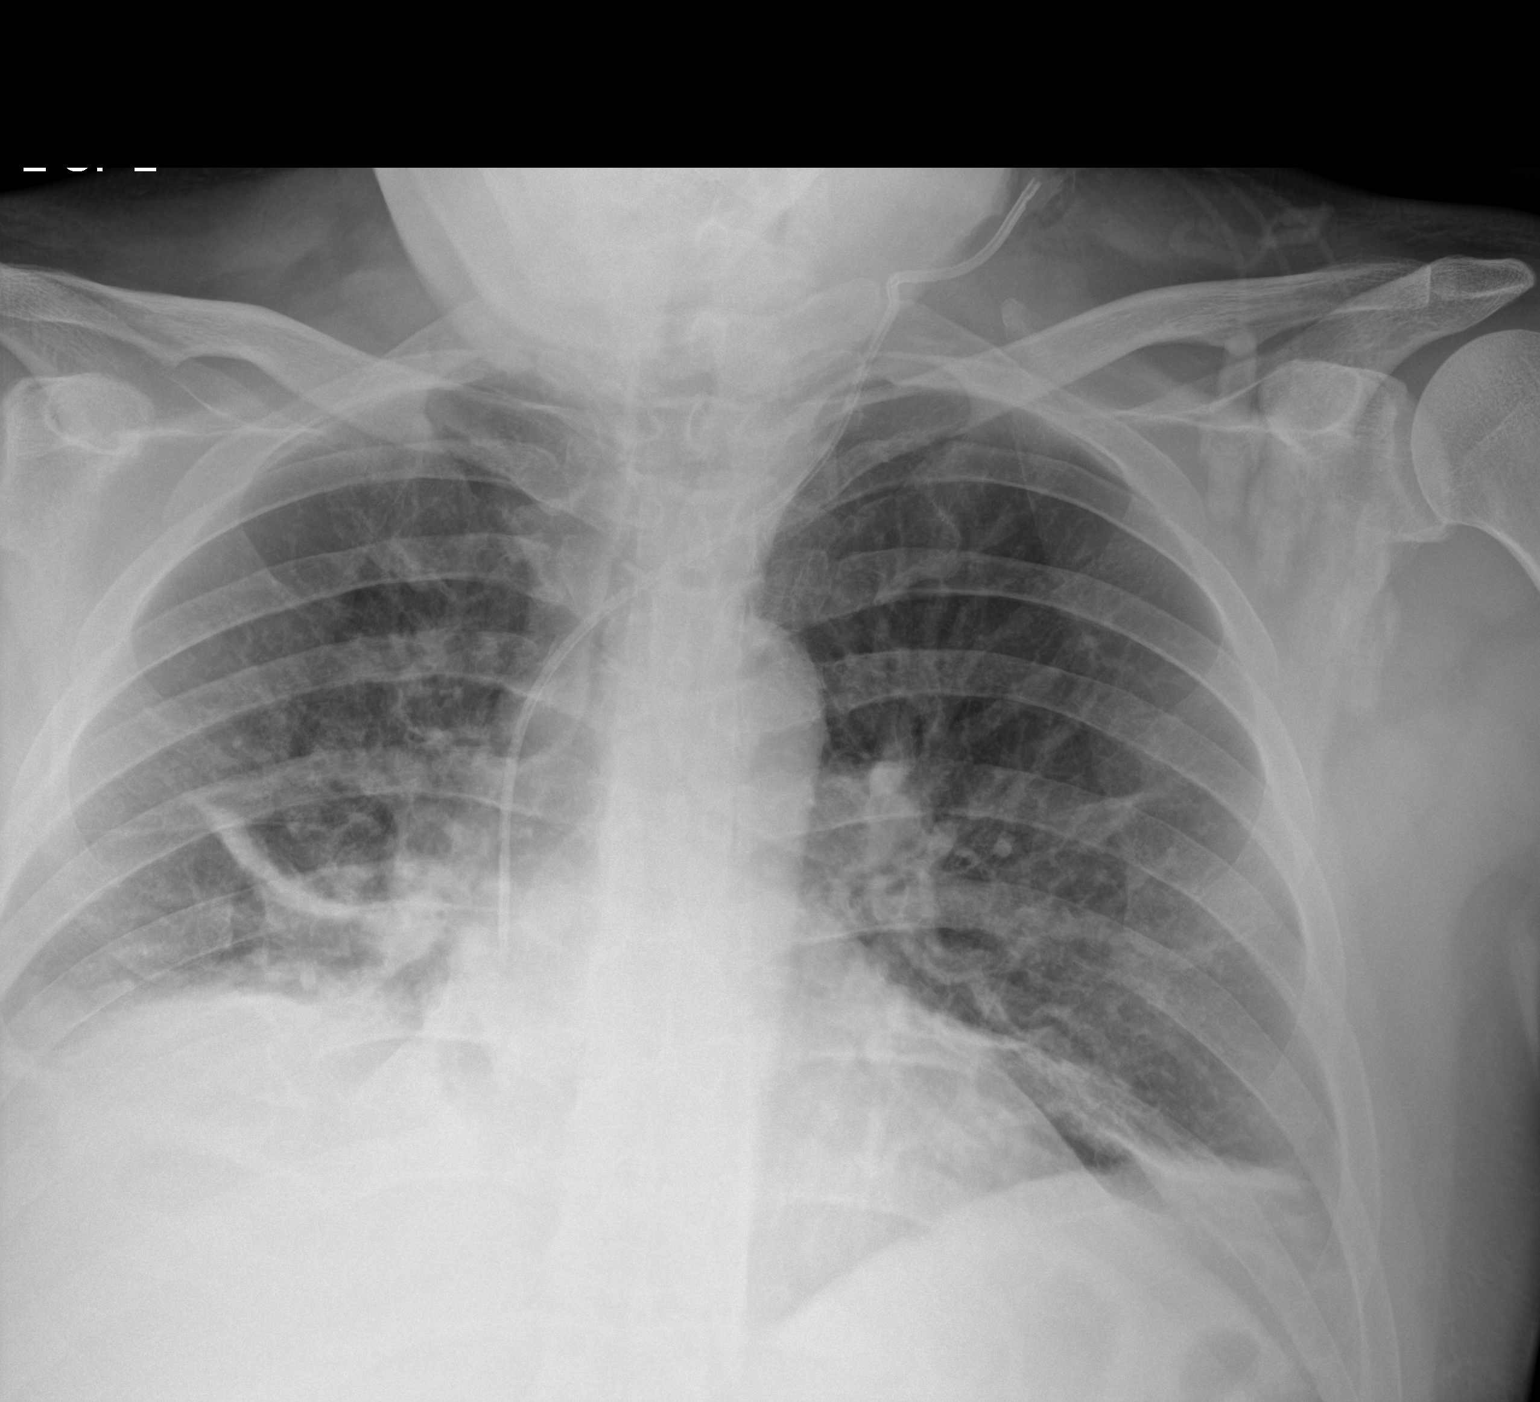
[im 2/2]
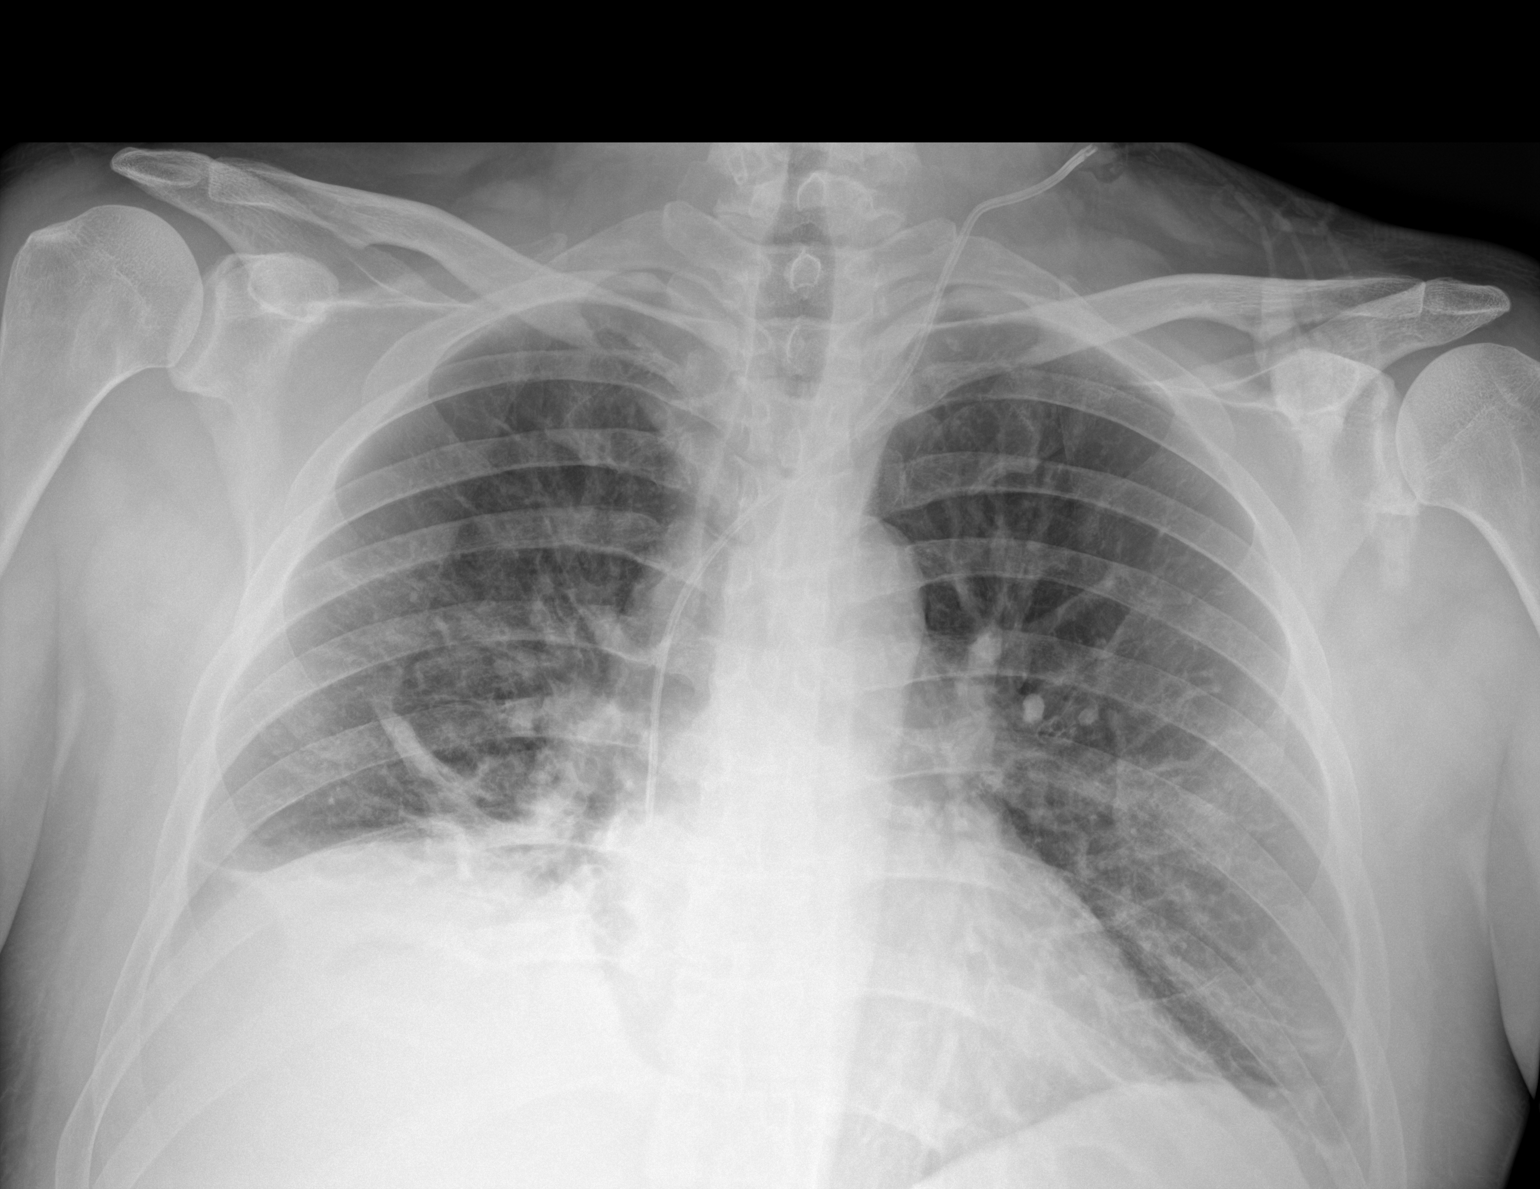

[2 of 2 positions shown; findings below may reference images not displayed]

FINDINGS: The heart size and mediastinal contours are within normal limits.
There is a mild bilateral atelectasis/airspace disease. Small
pleural effusions likely contribute. There is no pneumothorax. There
is no pneumothorax. The visualized skeletal structures are
unremarkable. A left internal jugular central venous catheter tip
overlies the superior cavoatrial junction.
IMPRESSION: Mild bilateral atelectasis/airspace disease. Small pleural effusions
likely contribute.

## 2022-03-01 ENCOUNTER — Telehealth: Payer: Self-pay | Admitting: Licensed Clinical Social Worker

## 2022-03-01 NOTE — Telephone Encounter (Signed)
H&V Care Navigation CSW Progress Note  Clinical Social Worker  contacted the pt via email  to respond to request for assistance w/ renewing [facility]. Pt email stated the following: "Hello Adam Carter,  I was wondering what I needed to do to reapply for medical assistance with [facility]. I'm working part time, 24 hours a week at the moment so I dont know if that will change anything. I have check stubs if needed. I know it ends this month so I wanted to see what I needed to do so I can see the doctor next month. Thanks, Adam Carter."  I responded the following: "Hi Adam Carter, At this time I don't see any upcoming appointments scheduled for you. That means there are no accounts with balances due if that makes sense. If you have an appt scheduled/go to the ED, Urgent Care, or are admitted then we can help you reapply! Your [employer] application is approved through 08/2022 so you are still good with that. Let me know if any of this does not make sense and you are welcome to call me with any additional questions! Adam Carter, [place]."   Patient is participating in a Managed Medicaid Plan:  No, self pay only. (CAFA approved)  SDOH Screenings   Food Insecurity: No Food Insecurity (08/04/2021)  Housing: Low Risk  (08/04/2021)  Transportation Needs: No Transportation Needs (08/04/2021)  Depression (PHQ2-9): Medium Risk (12/16/2020)  Financial Resource Strain: High Risk (08/04/2021)  Stress: Stress Concern Present (08/04/2021)  Tobacco Use: Medium Risk (09/28/2021)

## 2022-03-02 NOTE — Telephone Encounter (Signed)
H&V Care Navigation CSW Progress Note  Clinical Social Worker  contacted pt by email in response to his email  to f/u on further inquiry about reapplying. Pt was able to make an appt for 9/12, we discussed reapplying and letting them know that he had reapplied. I will place new CAFA in the mail for pt along with my card. Will f/u to ensure received/answer any additional questions.   Patient is participating in a Managed Medicaid Plan:  No, Self Pay Only (CAFA approved)  SDOH Screenings   Food Insecurity: No Food Insecurity (08/04/2021)  Housing: Low Risk  (08/04/2021)  Transportation Needs: No Transportation Needs (08/04/2021)  Depression (PHQ2-9): Medium Risk (12/16/2020)  Financial Resource Strain: High Risk (08/04/2021)  Stress: Stress Concern Present (08/04/2021)  Tobacco Use: Medium Risk (09/28/2021)   Octavio Graves, MSW, LCSW Clinical Social Worker II [facility] Heart/Vascular Care Navigation  [phone]- work cell phone (preferred) [phone]- desk phone

## 2022-03-07 NOTE — Progress Notes (Unsigned)
Cardiology Office Note    Date:  03/08/2022   ID:  Adam Carter, DOB [birth year], MRN [id number]  PCP:  Pcp, No  Cardiologist:  Lorine Bears, MD  Electrophysiologist:  None   Chief Complaint: Follow-up  History of Present Illness:   Adam Carter is a 46 y.o. male with history of CAD with anterior ST elevation MI in 08/2020 complicated by V-fib arrest status post PCI/DES to the ostial LAD, HFrEF secondary to ICM, HLD, and prior tobacco use who presents for follow-up of his CAD and cardiomyopathy.  He was admitted to the hospital in 08/2020 with an anterior ST elevation MI complicated by V-fib arrest status post CPR, 2 shocks, and a single dose of epinephrine in the field followed by ROSC.  Post-arrest EKG showed evidence of anterior ST elevation MI.  Emergent LHC showed an occluded ostial LAD with otherwise no obstructive disease.  He underwent successful PCI/DES to the ostial LAD.  RHC was not suggestive of cardiogenic shock and he did not require mechanical support.  EF was 25 to 30% by LV gram.  Echo during the admission showed an EF of 30 to 35%, mild LVH, moderate hypokinesis of the mid apical anteroseptal wall, anterior wall, and anterolateral wall, normal RV systolic function and ventricular cavity size, and no significant valvular abnormalities.  His hospital course was complicated by aspiration pneumonia and mental status changes, though he was subsequently extubated with good neurologic recovery.  Repeat echo in 10/2020 showed an EF of 40 to 45%.  He was last seen in the office in 09/2021 and reported significant anxiety and depression following his cardiac event.  He reported his previous job was very stressful and he stopped working as a Oncologist.  In this setting, he lost his health insurance.  He was without symptoms of angina or decompensation.  He was able to get his medications, except for Entresto, via [place] [facility].  He comes in doing reasonably well from a cardiac perspective.  He is  without symptoms of chest pain or decompensation.  He continues to process his above hospitalization.  He is now working part-time.  Over the past couple of months, he has noted some right-sided neck discomfort that is not present when he is concentrating intently.  Symptoms do feel somewhat reminiscent of what he was experiencing leading up to his prior MI, however he is without radiation down the right arm, jaw pain, or paresthesias involving the fingers.  He is adherent and tolerating cardiac medications without issues.  He has not needed any as needed furosemide.  He does occasionally note some BRBPR and has been evaluated by GI for this with diagnosis of anal fissure.  He declined a colonoscopy secondary to financial constraints.  No syncope.  No falls.  He continues to abstain from tobacco use.   Labs independently reviewed: 12/2020 - potassium 3.7, BUN 8, serum creatinine 0.87, Hgb 13.0, PLT 245 11/2020 - albumin 4.0, AST/ALT normal, TC 87, TG 99, HDL 27, LDL 40 08/2020 - A1c 6.0  Past Medical History:  Diagnosis Date   CHF (congestive heart failure) (HCC)    Coronary artery disease    occluded ostial LAD with otherwise no obstructive disease.   PCI/DES to ostial LAD   HFrEF (heart failure with reduced ejection fraction) (HCC)    EF 30-35%   History of cardiac arrest    VF arrest 3/14   History of ST elevation myocardial infarction (STEMI)    09/07/20 anteriolateral STEMI   History  of tobacco use    History of ventricular fibrillation    3/14   Hyperlipidemia LDL goal <70    Tobacco abuse 12/03/2016    Past Surgical History:  Procedure Laterality Date   CARDIAC CATHETERIZATION     CORONARY/GRAFT ACUTE MI REVASCULARIZATION N/A 09/07/2020   Procedure: Coronary/Graft Acute MI Revascularization;  Surgeon: Iran Ouch, MD;  Location: [facility] INVASIVE CV LAB;  Service: Cardiovascular;  Laterality: N/A;   RIGHT/LEFT HEART CATH AND CORONARY ANGIOGRAPHY N/A 09/07/2020   Procedure: RIGHT/LEFT  HEART CATH AND CORONARY ANGIOGRAPHY;  Surgeon: Iran Ouch, MD;  Location: [facility] INVASIVE CV LAB;  Service: Cardiovascular;  Laterality: N/A;    Current Medications: Current Meds  Medication Sig   carvedilol (COREG) 3.125 MG tablet Take 1 tablet (3.125 mg total) by mouth 2 (two) times daily.   sacubitril-valsartan (ENTRESTO) 24-26 MG Take 1 tablet by mouth 2 (two) times daily.   spironolactone (ALDACTONE) 25 MG tablet Take 1 tablet (25 mg total) by mouth daily.    Allergies:   Bacitracin-neomycin-polymyxin and Bupropion   Social History   Socioeconomic History   Marital status: Single    Spouse name: Not on file   Number of children: Not on file   Years of education: Not on file   Highest education level: Not on file  Occupational History   Not on file  Tobacco Use   Smoking status: Former    Types: Cigarettes    Quit date: 09/09/2020    Years since quitting: 1.4   Smokeless tobacco: Never  Vaping Use   Vaping Use: Never used  Substance and Sexual Activity   Alcohol use: Not Currently    Comment: rare   Drug use: Never   Sexual activity: Not on file  Other Topics Concern   Not on file  Social History Narrative   Not on file   Social Determinants of Health   Financial Resource Strain: High Risk (08/04/2021)   Overall Financial Resource Strain (CARDIA)    Difficulty of Paying Living Expenses: Hard  Food Insecurity: No Food Insecurity (08/04/2021)   Hunger Vital Sign    Worried About Running Out of Food in the Last Year: Never true    Ran Out of Food in the Last Year: Never true  Transportation Needs: No Transportation Needs (08/04/2021)   PRAPARE - Administrator, Civil Service (Medical): No    Lack of Transportation (Non-Medical): No  Physical Activity: Not on file  Stress: Stress Concern Present (08/04/2021)   [employer] of Occupational Health - Occupational Stress Questionnaire    Feeling of Stress : Very much  Social Connections: Not on file      Family History:  The patient's family history includes Diabetes in his father; Heart Problems in his father; Transient ischemic attack in his father.  ROS:   12-point review of systems is negative unless otherwise noted in HPI.   EKGs/Labs/Other Studies Reviewed:    Studies reviewed were summarized above. The additional studies were reviewed today:  2D echo 10/23/2020: 1. Left ventricular ejection fraction, by estimation, is 40 to 45%. The  left ventricle has mildly decreased function. The left ventricle  demonstrates regional wall motion abnormalities (hypokinesis of the mid to  apical anterior and anteroseptal wall).  Left ventricular diastolic parameters are consistent with Grade II  diastolic dysfunction (pseudonormalization).   2. Right ventricular systolic function is normal. The right ventricular  size is normal.   3. The mitral valve is normal  in structure. No evidence of mitral valve  regurgitation. No evidence of mitral stenosis.   Comparison(s): EF 30-35%, moderate hypokinesis LV, mid-apical,  anteroseptal wall, anterior wall, anterolateral wall.  GLS -5.3%. __________  2D echo 09/08/2020: 1. Left ventricular ejection fraction, by estimation, is 30 to 35%. The  left ventricle has moderately decreased function. The left ventricle  demonstrates regional wall motion abnormalities (see scoring  diagram/findings for description). There is mild  left ventricular hypertrophy. Left ventricular diastolic parameters were  normal. There is moderate hypokinesis of the left ventricular, mid-apical  anteroseptal wall, anterior wall and anterolateral wall. The average left  ventricular global longitudinal  strain is -5.3 %. The global longitudinal strain is abnormal.   2. Right ventricular systolic function is normal. The right ventricular  size is normal. Tricuspid regurgitation signal is inadequate for assessing  PA pressure.   3. The mitral valve is normal in structure.  No evidence of mitral valve  regurgitation. No evidence of mitral stenosis.   4. The aortic valve is normal in structure. Aortic valve regurgitation is  not visualized. No aortic stenosis is present. __________  [facility] 09/07/2020: The left ventricular ejection fraction is 25-35% by visual estimate. There is moderate to severe left ventricular systolic dysfunction. LV end diastolic pressure is moderately elevated. Ost LAD to Prox LAD lesion is 100% stenosed. Post intervention, there is a 0% residual stenosis. A drug-eluting stent was successfully placed using a STENT RESOLUTE ONYX 3.5X12. Mid LAD lesion is 30% stenosed.   1.  Out of hospital cardiac arrest with anterior ST elevation myocardial infarction due to ostial occlusion of the LAD.  No other obstructive disease. 2.  Moderately severely reduced LV systolic function with an EF of 25 to 30% with severe anterior/apical hypokinesis. 3.  Successful angioplasty and drug-eluting stent placement to the ostial LAD. 4.  Right heart catheterization was performed after PCI and showed mildly elevated filling pressures, mild pulmonary hypertension and normal cardiac output.  PA sat was 78.7%.  Fick cardiac output was 6.17 with a cardiac index of 3.5.  CPO was 1.4.  Based on these hemodynamic numbers, no hemodynamic support was utilized.   Recommendations: Dual antiplatelet therapy for at least 1 year. High-dose atorvastatin. We will run Aggrastat infusion for 12 hours given uncertainty of oral absorption of antiplatelet medications. The patient had significant agitation and this will need to be managed.  He is currently on propofol drip and fentanyl drip.  I discussed with Dr. Belia Heman. Recommend electrolyte replacement.     EKG:  EKG is ordered today.  The EKG ordered today demonstrates sinus bradycardia, 58 bpm, low voltage QRS, prior septal infarct, no acute ST-T changes, no significant change when compared to prior tracing  Recent  Labs: 03/08/2022: Hemoglobin 13.2; Platelets 248  Recent Lipid Panel    Component Value Date/Time   CHOL 87 11/30/2020 0731   TRIG 99 11/30/2020 0731   HDL 27 (L) 11/30/2020 0731   CHOLHDL 3.2 11/30/2020 0731   VLDL 20 11/30/2020 0731   LDLCALC 40 11/30/2020 0731    PHYSICAL EXAM:    VS:  BP 100/68 (BP Location: Left Arm, Patient Position: Sitting, Cuff Size: Normal)   Pulse (!) 58   Ht 5\' 7"  (1.702 m)   Wt 177 lb 12.8 oz (80.6 kg)   SpO2 98%   BMI 27.85 kg/m   BMI: Body mass index is 27.85 kg/m.  Physical Exam Vitals reviewed.  Constitutional:      Appearance: He is well-developed.  HENT:     Head: Normocephalic and atraumatic.  Eyes:     General:        Right eye: No discharge.        Left eye: No discharge.  Neck:     Vascular: No JVD.  Cardiovascular:     Rate and Rhythm: Normal rate and regular rhythm.     Pulses:          Posterior tibial pulses are 2+ on the right side and 2+ on the left side.     Heart sounds: Normal heart sounds, S1 normal and S2 normal. Heart sounds not distant. No midsystolic click and no opening snap. No murmur heard.    No friction rub.  Pulmonary:     Effort: Pulmonary effort is normal. No respiratory distress.     Breath sounds: Normal breath sounds. No decreased breath sounds, wheezing or rales.  Chest:     Chest wall: No tenderness.  Abdominal:     General: There is no distension.  Musculoskeletal:     Cervical back: Normal range of motion.     Right lower leg: No edema.     Left lower leg: No edema.  Skin:    General: Skin is warm and dry.     Nails: There is no clubbing.  Neurological:     Mental Status: He is alert and oriented to person, place, and time.  Psychiatric:        Speech: Speech normal.        Behavior: Behavior normal.        Thought Content: Thought content normal.        Judgment: Judgment normal.     Wt Readings from Last 3 Encounters:  03/08/22 177 lb 12.8 oz (80.6 kg)  09/28/21 176 lb 5 oz (80  kg)  05/25/21 173 lb 4 oz (78.6 kg)     ASSESSMENT & PLAN:   CAD with history of V-fib arrest and anterior STEMI involving the native coronary arteries stable angina: Currently chest pain-free.  He has noted a several month history of right-sided neck discomfort that is more noticeable when he is concentrating.  Symptoms do feel somewhat reminiscent of what he was experiencing leading up to his MI, however this time he is without the associated arm pain, jaw pain, and paresthesias within the fingers.  Currently asymptomatic.  Schedule Lexiscan MPI to evaluate for high risk ischemia.  Continue DAPT with aspirin and clopidogrel long-term, as tolerated, given ostial LAD stent location.  He will also continue carvedilol and atorvastatin.  Aggressive risk factor modification and secondary prevention.  Check CBC and CMP.  HFrEF secondary to ICM: He appears euvolemic and well compensated with NYHA class I symptoms.  Continue current GDMT including carvedilol, Entresto, spironolactone, and as needed furosemide (which he has not needed).  Relative hypotension precludes escalation of GDMT.  Most recent echo 1 month after his MI showed an improvement in his LV systolic function to 45% (previously 35%).  Reevaluate LV systolic function as part of MPI.  Check CMP.  CHF education.  HLD: LDL 40 in 11/2020.  He remains on atorvastatin 80 mg.  Check CMP, lipid panel, and direct LDL.  Prior tobacco use: He quit after his MI.   Shared Decision Making/Informed Consent{  The risks [chest pain, shortness of breath, cardiac arrhythmias, dizziness, blood pressure fluctuations, myocardial infarction, stroke/transient ischemic attack, nausea, vomiting, allergic reaction, radiation exposure, metallic taste sensation and life-threatening complications (estimated to be 1 in  10,000)], benefits (risk stratification, diagnosing coronary artery disease, treatment guidance) and alternatives of a nuclear stress test were discussed in  detail with Mr. Winski and he agrees to proceed.     Disposition: F/u with Dr. Kirke Corin or an APP in 2 months.   Medication Adjustments/Labs and Tests Ordered: Current medicines are reviewed at length with the patient today.  Concerns regarding medicines are outlined above. Medication changes, Labs and Tests ordered today are summarized above and listed in the Patient Instructions accessible in Encounters.   Signed, Eula Listen, PA-C 03/08/2022 11:15 AM     [facility] [place] [address] [place], [place] [address] [phone]

## 2022-03-08 ENCOUNTER — Encounter: Payer: Self-pay | Admitting: Physician Assistant

## 2022-03-08 ENCOUNTER — Ambulatory Visit: Payer: Self-pay | Attending: Physician Assistant | Admitting: Physician Assistant

## 2022-03-08 ENCOUNTER — Other Ambulatory Visit
Admission: RE | Admit: 2022-03-08 | Discharge: 2022-03-08 | Disposition: A | Discharge status: Home / Self Care | Payer: Self-pay | Attending: Physician Assistant | Admitting: Physician Assistant

## 2022-03-08 VITALS — BP 100/68 | HR 58 | Ht 67.0 in | Wt 177.8 lb

## 2022-03-08 DIAGNOSIS — I2510 Atherosclerotic heart disease of native coronary artery without angina pectoris: Secondary | ICD-10-CM | POA: Insufficient documentation

## 2022-03-08 DIAGNOSIS — Z87891 Personal history of nicotine dependence: Secondary | ICD-10-CM

## 2022-03-08 DIAGNOSIS — E785 Hyperlipidemia, unspecified: Secondary | ICD-10-CM

## 2022-03-08 DIAGNOSIS — M542 Cervicalgia: Secondary | ICD-10-CM

## 2022-03-08 DIAGNOSIS — I4901 Ventricular fibrillation: Secondary | ICD-10-CM

## 2022-03-08 DIAGNOSIS — I5022 Chronic systolic (congestive) heart failure: Secondary | ICD-10-CM | POA: Insufficient documentation

## 2022-03-08 DIAGNOSIS — I5020 Unspecified systolic (congestive) heart failure: Secondary | ICD-10-CM | POA: Insufficient documentation

## 2022-03-08 DIAGNOSIS — I255 Ischemic cardiomyopathy: Secondary | ICD-10-CM

## 2022-03-08 DIAGNOSIS — I25118 Atherosclerotic heart disease of native coronary artery with other forms of angina pectoris: Secondary | ICD-10-CM

## 2022-03-08 DIAGNOSIS — Z8674 Personal history of sudden cardiac arrest: Secondary | ICD-10-CM | POA: Insufficient documentation

## 2022-03-08 DIAGNOSIS — R072 Precordial pain: Secondary | ICD-10-CM | POA: Insufficient documentation

## 2022-03-08 LAB — COMPREHENSIVE METABOLIC PANEL
ALT: 25 U/L (ref 0–44)
AST: 22 U/L (ref 15–41)
Albumin: 4 g/dL (ref 3.5–5.0)
Alkaline Phosphatase: 92 U/L (ref 38–126)
Anion gap: 6 (ref 5–15)
BUN: 7 mg/dL (ref 6–20)
CO2: 28 mmol/L (ref 22–32)
Calcium: 9 mg/dL (ref 8.9–10.3)
Chloride: 103 mmol/L (ref 98–111)
Creatinine, Ser: 0.85 mg/dL (ref 0.61–1.24)
GFR, Estimated: >60 mL/min (ref >60)
Glucose, Bld: 102 mg/dL — ABNORMAL HIGH (ref 70–99)
Potassium: 4.2 mmol/L (ref 3.5–5.1)
Sodium: 137 mmol/L (ref 135–145)
Total Bilirubin: 1.5 mg/dL — ABNORMAL HIGH (ref 0.3–1.2)
Total Protein: 7 g/dL (ref 6.5–8.1)

## 2022-03-08 LAB — CBC
HCT: 40.1 % (ref 39.0–52.0)
Hemoglobin: 13.2 g/dL (ref 13.0–17.0)
MCH: 30.8 pg (ref 26.0–34.0)
MCHC: 32.9 g/dL (ref 30.0–36.0)
MCV: 93.5 fL (ref 80.0–100.0)
Platelets: 248 10*3/uL (ref 150–400)
RBC: 4.29 MIL/uL (ref 4.22–5.81)
RDW: 12.1 % (ref 11.5–15.5)
WBC: 7 10*3/uL (ref 4.0–10.5)
nRBC: 0 % (ref 0.0–0.2)

## 2022-03-08 LAB — LIPID PANEL
Cholesterol: 93 mg/dL (ref 0–200)
HDL: 28 mg/dL — ABNORMAL LOW (ref >40)
LDL Cholesterol: 51 mg/dL (ref 0–99)
Total CHOL/HDL Ratio: 3.3 RATIO
Triglycerides: 68 mg/dL (ref <150)
VLDL: 14 mg/dL (ref 0–40)

## 2022-03-08 NOTE — Patient Instructions (Addendum)
Medication Instructions:  Your physician recommends that you continue on your current medications as directed. Please refer to the Current Medication list given to you today.  *If you need a refill on your cardiac medications before your next appointment, please call your pharmacy*   Lab Work: CBC, CMET, Lipid panel, and direct LDL today over at the [facility] mall entrance. Just stop at registration and they will get you checked in.   If you have labs (blood work) drawn today and your tests are completely normal, you will receive your results only by: MyChart Message (if you have MyChart) OR A paper copy in the mail If you have any lab test that is abnormal or we need to change your treatment, we will call you to review the results.   Testing/Procedures: [facility] MYOVIEW  Your caregiver has ordered a Stress Test with nuclear imaging. The purpose of this test is to evaluate the blood supply to your heart muscle. This procedure is referred to as a "Non-Invasive Stress Test." This is because other than having an IV started in your vein, nothing is inserted or "invades" your body. Cardiac stress tests are done to find areas of poor blood flow to the heart by determining the extent of coronary artery disease (CAD). Some patients exercise on a treadmill, which naturally increases the blood flow to your heart, while others who are  unable to walk on a treadmill due to physical limitations have a pharmacologic/chemical stress agent called Lexiscan . This medicine will mimic walking on a treadmill by temporarily increasing your coronary blood flow.   Please note: these test may take anywhere between 2-4 hours to complete  PLEASE REPORT TO [facility] MEDICAL MALL ENTRANCE  THE VOLUNTEERS AT THE FIRST DESK WILL DIRECT YOU WHERE TO GO  Date of Procedure:__________________  Arrival Time for Procedure:________________________  Instructions regarding medication:   _XX___:  Hold Furosemide the morning of  your procedure.    _XX___:  Take all other medications with small sip of water.    PLEASE NOTIFY THE OFFICE AT LEAST 24 HOURS IN ADVANCE IF YOU ARE UNABLE TO KEEP YOUR APPOINTMENT.  [phone] AND  PLEASE NOTIFY NUCLEAR MEDICINE AT [facility] AT LEAST 24 HOURS IN ADVANCE IF YOU ARE UNABLE TO KEEP YOUR APPOINTMENT. [phone]  How to prepare for your Myoview test:  Do not eat or drink after midnight No caffeine for 24 hours prior to test No smoking 24 hours prior to test. Your medication may be taken with water.  If your doctor stopped a medication because of this test, do not take that medication. Wear something comfortable that is easy to get on and off. Please wear a short sleeve shirt. No perfume, cologne or lotion.     Follow-Up: At [facility], you and your health needs are our priority.  As part of our continuing mission to provide you with exceptional heart care, we have created designated Provider Care Teams.  These Care Teams include your primary Cardiologist (physician) and Advanced Practice Providers (APPs -  Physician Assistants and Nurse Practitioners) who all work together to provide you with the care you need, when you need it.   Your next appointment:   6 month(s)  The format for your next appointment:   In Person  Provider:   Lorine Bears, MD or Eula Listen, PA-C         Important Information About Sugar

## 2022-03-09 LAB — LDL CHOLESTEROL, DIRECT: Direct LDL: 48 mg/dL (ref 0–99)

## 2022-03-15 ENCOUNTER — Telehealth: Payer: Self-pay | Admitting: Licensed Clinical Social Worker

## 2022-03-15 NOTE — Telephone Encounter (Signed)
H&V Care Navigation CSW Progress Note  Clinical Social Worker contacted patient by phone to return call regarding CAFA application. Pt previous approval has lapsed, he is working on Comptroller. We discussed how to securely send me documents to assist with submitting to financial counseling department. Pt encouraged to call me with any additional questions.   Patient is participating in a Managed Medicaid Plan:  No, self pay, CAFA renewal pending  [place]: No Food Insecurity (08/04/2021)  Housing: Low Risk  (08/04/2021)  Transportation Needs: No Transportation Needs (08/04/2021)  Depression (PHQ2-9): Medium Risk (12/16/2020)  Financial Resource Strain: High Risk (08/04/2021)  Stress: Stress Concern Present (08/04/2021)  Tobacco Use: Medium Risk (03/08/2022)    Westley Hummer, MSW, [place]  [phone]- work cell phone (preferred) [phone]- desk phone

## 2022-03-15 NOTE — Telephone Encounter (Signed)
H&V Care Navigation CSW Progress Note  Clinical Social Worker  mailed in application and documents brought to the office by pt/emailed securely to this Probation officer  to renew CAFA. Will monitor for any updates.   Patient is participating in a Managed Medicaid Plan:  No, self pay only. CAFA renewal pending.   SDOH Screenings   Food Insecurity: No Food Insecurity (08/04/2021)  Housing: Low Risk  (08/04/2021)  Transportation Needs: No Transportation Needs (08/04/2021)  Depression (PHQ2-9): Medium Risk (12/16/2020)  Financial Resource Strain: High Risk (08/04/2021)  Stress: Stress Concern Present (08/04/2021)  Tobacco Use: Medium Risk (03/08/2022)      Westley Hummer, MSW, [place]  [phone]- work cell phone (preferred) [phone]- desk phone

## 2022-03-16 ENCOUNTER — Encounter
Admission: RE | Admit: 2022-03-16 | Discharge: 2022-03-16 | Disposition: A | Discharge status: Home / Self Care | Payer: Self-pay | Source: Ambulatory Visit | Attending: Physician Assistant | Admitting: Physician Assistant

## 2022-03-16 DIAGNOSIS — I2510 Atherosclerotic heart disease of native coronary artery without angina pectoris: Secondary | ICD-10-CM

## 2022-03-16 DIAGNOSIS — M542 Cervicalgia: Secondary | ICD-10-CM | POA: Insufficient documentation

## 2022-03-16 LAB — NM MYOCAR MULTI W/SPECT W/WALL MOTION / EF
Base ST Depression (mm): 0 mm
LV dias vol: 115 mL (ref 62–150)
LV sys vol: 44 mL
Nuc Stress EF: 62 %
Peak HR: 100 {beats}/min
Percent HR: 57 %
Rest HR: 51 {beats}/min
Rest Nuclear Isotope Dose: 11 mCi
SDS: 2
SRS: 7
SSS: 9
ST Depression (mm): 0 mm
Stress Nuclear Isotope Dose: 31.1 mCi
TID: 0.95

## 2022-03-16 MED ORDER — TECHNETIUM TC 99M TETROFOSMIN IV KIT
10.9500 | PACK | Freq: Once | INTRAVENOUS | Status: AC | PRN
  Administered 2022-03-16: 10.95 via INTRAVENOUS

## 2022-03-16 MED ORDER — TECHNETIUM TC 99M TETROFOSMIN IV KIT
30.0000 | PACK | Freq: Once | INTRAVENOUS | Status: AC
  Administered 2022-03-16: 31.14 via INTRAVENOUS

## 2022-03-16 MED ORDER — REGADENOSON 0.4 MG/5ML IV SOLN
0.4000 mg | Freq: Once | INTRAVENOUS | Status: AC
  Administered 2022-03-16: 0.4 mg via INTRAVENOUS

## 2022-03-28 ENCOUNTER — Other Ambulatory Visit: Payer: Self-pay | Admitting: Cardiovascular Disease

## 2022-04-13 ENCOUNTER — Telehealth: Payer: Self-pay | Admitting: Licensed Clinical Social Worker

## 2022-04-13 NOTE — Telephone Encounter (Signed)
H&V Care Navigation CSW Progress Note  CHARITY COMPLETE. PATIENT APPROVED FOR 75% FINANCIAL ASSISTANCE PER CAFA APPLICATION AND SUPPORTING DOCUMENTATION.  CAFA APP SCANNED TO ACCOUNT [id number] APPROVAL DATES OF [employer]PL ---- 03/08/22 - 09/06/22  APPROVAL LETTER ON ACCOUNT [id number]   Mailed pt a copy of letter and securely emailed him one as well. I remain available.   SDOH Screenings   Food Insecurity: No Food Insecurity (08/04/2021)  Housing: Low Risk  (08/04/2021)  Transportation Needs: No Transportation Needs (08/04/2021)  Depression (PHQ2-9): Medium Risk (12/16/2020)  Financial Resource Strain: High Risk (08/04/2021)  Stress: Stress Concern Present (08/04/2021)  Tobacco Use: Medium Risk (03/08/2022)   Westley Hummer, MSW, [place]  [phone]- work cell phone (preferred) [phone]- desk phone

## 2022-04-14 ENCOUNTER — Telehealth: Payer: Self-pay | Admitting: Licensed Clinical Social Worker

## 2022-04-14 NOTE — Telephone Encounter (Signed)
H&V Care Navigation CSW Progress Note  Clinical Social Worker contacted patient by phone to f/u on CAFA approval. Pt shares he hasn't checked his e-mail but will do so. We discussed he was approved for 75% financial assistance, to bring letter to all future appts and if he has received bills from past appt/scans to call billing and ensure they have been appropriately re-billed under the discount. Pt appreciative of update. I remain available.  Patient is participating in a Managed Medicaid Plan:  No, self pay. [facility] Financial Assistance approved  SDOH Screenings   Food Insecurity: No Food Insecurity (08/04/2021)  Housing: Low Risk  (08/04/2021)  Transportation Needs: No Transportation Needs (08/04/2021)  Depression (PHQ2-9): Medium Risk (12/16/2020)  Financial Resource Strain: High Risk (08/04/2021)  Stress: Stress Concern Present (08/04/2021)  Tobacco Use: Medium Risk (03/08/2022)   Westley Hummer, MSW, [place]  [phone]- work cell phone (preferred) [phone]- desk phone

## 2022-07-04 ENCOUNTER — Other Ambulatory Visit: Payer: Self-pay | Admitting: Cardiovascular Disease

## 2022-07-12 ENCOUNTER — Other Ambulatory Visit: Payer: Self-pay

## 2022-07-12 MED ORDER — ENTRESTO 24-26 MG PO TABS: 1.0000 | ORAL_TABLET | Freq: Two times a day (BID) | ORAL | 3 refills | Status: DC

## 2022-08-01 ENCOUNTER — Telehealth: Payer: Self-pay | Admitting: Licensed Clinical Social Worker

## 2022-08-01 NOTE — Telephone Encounter (Signed)
H&V Care Navigation CSW Progress Note  Clinical Social Worker  received the following email on Saturday when clinic was closed, from pt  to clarify CAFA approval dates. "Francee Piccolo, I was wondering when the assistance will end and if I need to reapply before my appointment on September 06, 2022? I still have a balance for the stress test, will that be a problem when reapplying? Thanks Loma Sender"  LCSW shared the following response- "Hi Mr. Couey, Your approval was for 03/08/22-09/06/22, if you have any testing or appointments after that time then you should reapply. Otherwise 3/12 will fall in your approval.  If you aren't sure if your stress test was billed with your 75% discount you can call [phone]. If it was already rebilled with your discount then I recommend discussing a payment plan for it if you haven't already with that same number! Hope this gives you some clarity! Amerika Nourse"  Pt acknowledged LCSW response. I remain available for additional questions as needed.   Patient is participating in a Managed Medicaid Plan:  No, self pay only.   SDOH Screenings   Food Insecurity: No Food Insecurity (08/04/2021)  Housing: Low Risk  (08/04/2021)  Transportation Needs: No Transportation Needs (08/04/2021)  Depression (PHQ2-9): Medium Risk (12/16/2020)  Financial Resource Strain: High Risk (08/04/2021)  Stress: Stress Concern Present (08/04/2021)  Tobacco Use: Medium Risk (03/08/2022)   Westley Hummer, MSW, [place]  [phone]- work cell phone (preferred) [phone]- desk phone

## 2022-08-05 ENCOUNTER — Other Ambulatory Visit: Payer: Self-pay | Admitting: Cardiovascular Disease

## 2022-08-16 ENCOUNTER — Telehealth: Payer: Self-pay | Admitting: Licensed Clinical Social Worker

## 2022-08-16 NOTE — Telephone Encounter (Signed)
H&V Care Navigation CSW Progress Note  Clinical Social Worker  mailed pt new application  to renew NCMedAssist as old application expires [id number]. Pt requested to submit application and completed supporting documents to MedAssist directly. Remain available as needed.   Patient is participating in a Managed Medicaid Plan:  No, self pay, has CAFA approval through 09/06/2022  [place]: No Food Insecurity (08/04/2021)  Housing: Low Risk  (08/04/2021)  Transportation Needs: No Transportation Needs (08/04/2021)  Depression (PHQ2-9): Medium Risk (12/16/2020)  Financial Resource Strain: High Risk (08/04/2021)  Stress: Stress Concern Present (08/04/2021)  Tobacco Use: Medium Risk (03/08/2022)    Westley Hummer, MSW, LCSW Clinical Social Worker II [place]  [phone]- work cell phone (preferred) [phone]- desk phone

## 2022-08-24 NOTE — Telephone Encounter (Signed)
H&V Care Navigation CSW Progress Note  Clinical Social Worker contacted patient by phone to f/u on confirmation of approval through 07/29/2023 with NCMedAssist, will make note on pt chart of this update. Pt [facility] Financial [facility] approval through upcoming appt. If he has any additional visits scheduled after that time then can reapply. Pt states understanding. No additional questions/concerns at this time.    Patient is participating in a Managed Medicaid Plan:  No, self pay, [facility] Financial Assistance only  [place]: No Food Insecurity (08/24/2022)  Housing: Low Risk  (08/24/2022)  Transportation Needs: No Transportation Needs (08/24/2022)  Utilities: Not At Risk (08/24/2022)  Depression (PHQ2-9): Medium Risk (12/16/2020)  Financial Resource Strain: Medium Risk (08/24/2022)  Stress: No Stress Concern Present (08/24/2022)  Tobacco Use: Medium Risk (03/08/2022)    Westley Hummer, MSW, [place]  [phone]- work cell phone (preferred) [phone]- desk phone

## 2022-09-01 ENCOUNTER — Telehealth (HOSPITAL_BASED_OUTPATIENT_CLINIC_OR_DEPARTMENT_OTHER): Payer: Self-pay | Admitting: Licensed Clinical Social Worker

## 2022-09-01 NOTE — Telephone Encounter (Signed)
H&V Care Navigation CSW Progress Note  Clinical Social Worker contacted patient by phone to f/u on text received inquiring about billing. Pt sent the following message "Francee Piccolo, I looked into making a payment through [employer] wallet and it said it went into collection. I answered a number that came up as suspected spam due to collections and it happened to say it was for the past due bill. The company mentoned in the automated message it was "[employer];" are they legitimate or no? I only ask because of the suspected spam warning when they called." LCSW responded that pt should check with billing at [phone] as I am not involved in that side of things to double check.  Pt responded that he received text and thanked this Probation officer.   Patient is participating in a Managed Medicaid Plan:  No, CAFA  [place]: No Food Insecurity (08/24/2022)  Housing: Low Risk  (08/24/2022)  Transportation Needs: No Transportation Needs (08/24/2022)  Utilities: Not At Risk (08/24/2022)  Depression (PHQ2-9): Medium Risk (12/16/2020)  Financial Resource Strain: Medium Risk (08/24/2022)  Stress: No Stress Concern Present (08/24/2022)  Tobacco Use: Medium Risk (03/08/2022)   Westley Hummer, MSW, [place]  [phone]- work cell phone (preferred) [phone]- desk phone

## 2022-09-06 ENCOUNTER — Ambulatory Visit: Payer: Medicaid Other | Attending: Cardiovascular Disease | Admitting: Cardiovascular Disease

## 2022-09-06 ENCOUNTER — Encounter: Payer: Self-pay | Admitting: Cardiovascular Disease

## 2022-09-06 VITALS — BP 110/70 | HR 69 | Ht 67.0 in | Wt 182.1 lb

## 2022-09-06 DIAGNOSIS — I5022 Chronic systolic (congestive) heart failure: Secondary | ICD-10-CM

## 2022-09-06 DIAGNOSIS — I2510 Atherosclerotic heart disease of native coronary artery without angina pectoris: Secondary | ICD-10-CM

## 2022-09-06 DIAGNOSIS — E785 Hyperlipidemia, unspecified: Secondary | ICD-10-CM

## 2022-09-06 DIAGNOSIS — I255 Ischemic cardiomyopathy: Secondary | ICD-10-CM | POA: Diagnosis not present

## 2022-09-06 DIAGNOSIS — Z87891 Personal history of nicotine dependence: Secondary | ICD-10-CM

## 2022-09-06 NOTE — Progress Notes (Signed)
Cardiology Office Note   Date:  09/06/2022   ID:  Adam Carter, DOB Oct 23, 1975, MRN [id number]  PCP:  Pcp, No  Cardiologist:  Kathlyn Sacramento, MD   Chief Complaint  Patient presents with   Other    6 month f/u c/o swelling left side of chest and can feel soreness/pain. Meds reviewed verbally with pt.       History of Present Illness: Adam Carter is a 47 y.o. male who presents for a follow-up visit regarding coronary artery disease.  The patient has history of prior tobacco use.  He presented in March of this year with out of hospital cardiac arrest due to anterior ST elevation myocardial infarction.  Emergent cardiac catheterization was performed which showed ostial occlusion of the LAD with no other obstructive disease.  Ejection fraction was 25 to 30%.  I performed successful angioplasty and drug-eluting stent placement to the ostial LAD.  Right heart catheterization was not suggestive of cardiogenic shock and he did not require mechanical support.  Hospital course was complicated by aspiration pneumonia and mental status changes but ultimately he was extubated with very good neurologic recovery.  He had a repeat echocardiogram done in April of 2022 which showed an ejection fraction of 40 to 45%. He quit smoking since his cardiac event.    He had significant anxiety and depression after his cardiac event.  He currently works part-time. He had atypical chest pain last year and underwent a Lexiscan Myoview in September which showed evidence of prior anterior infarct without ischemia and normal ejection fraction.  No recurrent chest pain or shortness of breath.  He does complain of tenderness in the left breast area with some swelling.  Past Medical History:  Diagnosis Date   CHF (congestive heart failure) (HCC)    Coronary artery disease    occluded ostial LAD with otherwise no obstructive disease.   PCI/DES to ostial LAD   HFrEF (heart failure with reduced ejection fraction) (HCC)     EF 30-35%   History of cardiac arrest    VF arrest 3/14   History of ST elevation myocardial infarction (STEMI)    09/07/20 anteriolateral STEMI   History of tobacco use    History of ventricular fibrillation    3/14   Hyperlipidemia LDL goal <70    Tobacco abuse 12/03/2016    Past Surgical History:  Procedure Laterality Date   CARDIAC CATHETERIZATION     CORONARY/GRAFT ACUTE MI REVASCULARIZATION N/A 09/07/2020   Procedure: Coronary/Graft Acute MI Revascularization;  Surgeon: Wellington Hampshire, MD;  Location: [place] [facility];  Service: Cardiovascular;  Laterality: N/A;   RIGHT/LEFT HEART CATH AND CORONARY ANGIOGRAPHY N/A 09/07/2020   Procedure: RIGHT/LEFT HEART CATH AND CORONARY ANGIOGRAPHY;  Surgeon: Wellington Hampshire, MD;  Location: [place] [facility];  Service: Cardiovascular;  Laterality: N/A;     Current Outpatient Medications  Medication Sig Dispense Refill   aspirin EC (ASPIRIN LOW DOSE) 81 MG tablet TAKE 1 Tablet BY MOUTH ONCE EVERY DAY 90 tablet 0   atorvastatin (LIPITOR) 80 MG tablet TAKE 1 Tablet BY MOUTH ONCE EVERY DAY AT 6PM 90 tablet 0   carvedilol (COREG) 3.125 MG tablet Take 1 tablet (3.125 mg total) by mouth 2 (two) times daily with a meal. 180 tablet 0   clopidogrel (PLAVIX) 75 MG tablet Take 1 tablet (75 mg total) by mouth daily. 90 tablet 0   furosemide (LASIX) 20 MG tablet TAKE 1 Tablet BY MOUTH ONCE EVERY DAY AS  NEEDED 90 tablet 0   Multiple Vitamin (MULTIVITAMIN WITH MINERALS) TABS tablet Take 1 tablet by mouth daily. 30 tablet 0   pantoprazole (PROTONIX) 20 MG tablet Take 1 tablet (20 mg total) by mouth daily. 30 tablet 5   sacubitril-valsartan (ENTRESTO) 24-26 MG Take 1 tablet by mouth 2 (two) times daily. 180 tablet 3   No current facility-administered medications for this visit.    Allergies:   Bacitracin-neomycin-polymyxin and Bupropion    Social History:  The patient  reports that he quit smoking about 1 years ago. His smoking use included  cigarettes. He has never used smokeless tobacco. He reports that he does not currently use alcohol. He reports that he does not use drugs.   Family History:  The patient's family history includes Diabetes in his father; Heart Problems in his father; Transient ischemic attack in his father.    ROS:  Please see the history of present illness.   Otherwise, review of systems are positive for none.   All other systems are reviewed and negative.    PHYSICAL EXAM: VS:  BP 110/70 (BP Location: Left Arm, Patient Position: Sitting, Cuff Size: Normal)   Pulse 69   Ht '5\' 7"'$  (1.702 m)   Wt 182 lb 2 oz (82.6 kg)   SpO2 98%   BMI 28.52 kg/m  , BMI Body mass index is 28.52 kg/m. GEN: Well nourished, well developed, in no acute distress  HEENT: normal  Neck: no JVD, carotid bruits, or masses Cardiac: RRR; no murmurs, rubs, or gallops,no edema  Respiratory:  clear to auscultation bilaterally, normal work of breathing GI: soft, nontender, nondistended, + BS MS: no deformity or atrophy  Skin: warm and dry, no rash Neuro:  Strength and sensation are intact Psych: euthymic mood, full affect   EKG:  EKG is ordered today. EKG showed sinus rhythm with sinus arrhythmia and low voltage.  Old septal infarct.  Recent Labs: 03/08/2022: ALT 25; BUN 7; Creatinine, Ser 0.85; Hemoglobin 13.2; Platelets 248; Potassium 4.2; Sodium 137    Lipid Panel    Component Value Date/Time   CHOL 93 03/08/2022 1053   TRIG 68 03/08/2022 1053   HDL 28 (L) 03/08/2022 1053   CHOLHDL 3.3 03/08/2022 1053   VLDL 14 03/08/2022 1053   LDLCALC 51 03/08/2022 1053   LDLDIRECT 48 03/08/2022 1053      Wt Readings from Last 3 Encounters:  09/06/22 182 lb 2 oz (82.6 kg)  03/08/22 177 lb 12.8 oz (80.6 kg)  09/28/21 176 lb 5 oz (80 kg)          No data to display            ASSESSMENT AND PLAN:  1.  Coronary artery disease involving native coronary arteries without angina: He is doing very well with no anginal  symptoms.  Continue dual antiplatelet therapy long-term as tolerated given  ostial LAD stent location.  2.  Chronic systolic heart failure due to ischemic cardiomyopathy: He appears to be euvolemic without any diuretics.   Continue carvedilol and Entresto  3.  Hyperlipidemia: Continue high-dose atorvastatin.  I reviewed most recent lipid profile done in September which showed an LDL 51.  4.  Previous tobacco use: Quit smoking after his myocardial infarction.  5.  Left breast tenderness and swelling: Likely due to spironolactone.  I discontinued the medication today.  I instructed him to notify us in 1 month if there is no improvement of this.  If this does not resolve, he  will require further imaging.    Disposition:   FU with me in 6 months  Signed,  Kathlyn Sacramento, MD  09/06/2022 4:19 PM    [place]

## 2022-09-06 NOTE — Patient Instructions (Signed)
Medication Instructions:  STOP the Spironolactone  *If you need a refill on your cardiac medications before your next appointment, please call your pharmacy*   Lab Work: None ordered If you have labs (blood work) drawn today and your tests are completely normal, you will receive your results only by: [place] (if you have MyChart) OR A paper copy in the mail If you have any lab test that is abnormal or we need to change your treatment, we will call you to review the results.   Testing/Procedures: None ordered   Follow-Up: At [facility], you and your health needs are our priority.  As part of our continuing mission to provide you with exceptional heart care, we have created designated Provider Care Teams.  These Care Teams include your primary Cardiologist (physician) and Advanced Practice Providers (APPs -  Physician Assistants and Nurse Practitioners) who all work together to provide you with the care you need, when you need it.  We recommend signing up for the patient portal called "MyChart".  Sign up information is provided on this After Visit Summary.  MyChart is used to connect with patients for Virtual Visits (Telemedicine).  Patients are able to view lab/test results, encounter notes, upcoming appointments, etc.  Non-urgent messages can be sent to your provider as well.   To learn more about what you can do with MyChart, go to [email or web address].    Your next appointment:   6 month(s)  Provider:   You may see Kathlyn Sacramento, MD or one of the following Advanced Practice Providers on your designated Care Team:   Murray Hodgkins, NP Christell Faith, PA-C Cadence Kathlen Mody, PA-C Gerrie Nordmann, NP

## 2022-09-09 ENCOUNTER — Telehealth: Payer: Self-pay | Admitting: *Deleted

## 2022-09-09 MED ORDER — ENTRESTO 24-26 MG PO TABS: 1.0000 | ORAL_TABLET | Freq: Two times a day (BID) | ORAL | 3 refills | Status: DC

## 2022-09-09 NOTE — Telephone Encounter (Signed)
Entresto assistance forms have been faxed.

## 2022-09-21 ENCOUNTER — Telehealth: Payer: Self-pay | Admitting: Licensed Clinical Social Worker

## 2022-09-21 NOTE — Telephone Encounter (Signed)
H&V Care Navigation CSW Progress Note  Clinical Social Worker contacted patient by phone to f/u and ensure that his medications had started arriving. Pt shares that he actually has been approved finally for Medicaid. He received his cards from [facility]. We discussed having medications switched to pharmacy as not eligible for PAP and NCMedassist with Medicaid coverage. Pt interested in delivery option from [facility] since it is free and would save him trips to pharmacy/he had issues with [facility] in past. LCSW has sent pt information about that and how to use his Medicaid benefits. See below  "Hi Mr. Mesias, More information about the [place] here: [facility] [place] operates a home delivery pharmacy at [facility] [place] with no cost for shipping prescription medications. If you would like to enroll in this service, contact the pharmacy at [phone]. Provide them with your Medicaid ID information and they also may request a card on file for any copays. Delivery again is free from these pharmacies (but you can triple check me with the pharmacy directly!).   They should be able to transition over your medications, hopefully!  Also if you have questions about your Medicaid or ever want to you can contact [facility] Medicaid or the Managed Medicaid line for [facility] ([email or web address]). It looks like your Medicaid assigned you a Primary Care Provider at [facility] ([email or web address] you can call them and get a new patient appointment at [phone]. If there is another primary care provider you are interested in seeing you don't have to see the assigned provider but should call and establish and then let [facility] know you have changed assigned providers.   Let me know if you have any  issues!  Michiel Cowboy"   Patient is participating in a Managed Medicaid Plan:  Yes-[employer] Medicaid  [place]: No Food Insecurity (08/24/2022)  Housing: Low Risk  (08/24/2022)  Transportation Needs: No Transportation Needs (08/24/2022)  Utilities: Not At Risk (08/24/2022)  Depression (PHQ2-9): Medium Risk (12/16/2020)  Financial Resource Strain: Medium Risk (08/24/2022)  Stress: No Stress Concern Present (08/24/2022)  Tobacco Use: Medium Risk (09/06/2022)   Westley Hummer, MSW, [place]  [phone]- work cell phone (preferred) [phone]- desk phone

## 2022-09-26 NOTE — Telephone Encounter (Signed)
[name] have been approved through 10/01/23

## 2022-09-28 ENCOUNTER — Other Ambulatory Visit: Payer: Self-pay | Admitting: Cardiovascular Disease

## 2022-09-28 ENCOUNTER — Other Ambulatory Visit: Payer: Self-pay

## 2022-09-28 ENCOUNTER — Telehealth (HOSPITAL_BASED_OUTPATIENT_CLINIC_OR_DEPARTMENT_OTHER): Payer: Self-pay | Admitting: Licensed Clinical Social Worker

## 2022-09-28 ENCOUNTER — Encounter (HOSPITAL_COMMUNITY): Payer: Self-pay

## 2022-09-28 ENCOUNTER — Other Ambulatory Visit (HOSPITAL_COMMUNITY): Payer: Self-pay

## 2022-09-28 MED ORDER — ASPIRIN 81 MG PO TBEC
81.0000 mg | DELAYED_RELEASE_TABLET | Freq: Every day | ORAL | 2 refills | Status: DC
  Filled 2022-09-28 – 2022-12-22 (×2): qty 90, 90d supply, fill #0
  Filled 2023-03-22: qty 90, 90d supply, fill #1
  Filled 2023-06-16: qty 90, 90d supply, fill #2

## 2022-09-28 MED ORDER — CARVEDILOL 3.125 MG PO TABS
3.1250 mg | ORAL_TABLET | Freq: Two times a day (BID) | ORAL | 2 refills | Status: DC
  Filled 2022-09-28: qty 180, 90d supply, fill #0
  Filled 2022-12-22: qty 180, 90d supply, fill #1
  Filled 2023-03-22: qty 180, 90d supply, fill #2

## 2022-09-28 MED ORDER — ATORVASTATIN CALCIUM 80 MG PO TABS
80.0000 mg | ORAL_TABLET | Freq: Every day | ORAL | 2 refills | Status: DC
  Filled 2022-09-28: qty 90, 90d supply, fill #0
  Filled 2022-12-22: qty 90, 90d supply, fill #1
  Filled 2023-03-22: qty 90, 90d supply, fill #2

## 2022-09-28 MED ORDER — ENTRESTO 24-26 MG PO TABS
1.0000 | ORAL_TABLET | Freq: Two times a day (BID) | ORAL | 2 refills | Status: DC
  Filled 2022-09-28 – 2022-10-10 (×2): qty 180, 90d supply, fill #0
  Filled 2022-12-22: qty 180, 90d supply, fill #1
  Filled 2023-03-22: qty 180, 90d supply, fill #2

## 2022-09-28 MED ORDER — CLOPIDOGREL BISULFATE 75 MG PO TABS
75.0000 mg | ORAL_TABLET | Freq: Every day | ORAL | 2 refills | Status: DC
  Filled 2022-09-28: qty 90, 90d supply, fill #0
  Filled 2022-12-22: qty 90, 90d supply, fill #1
  Filled 2023-03-22: qty 90, 90d supply, fill #2

## 2022-09-28 NOTE — Telephone Encounter (Signed)
H&V Care Navigation CSW Progress Note  Clinical Social Worker  contacted pt  to f/u on transferring medications to [facility]. See conversation via email below.  From: Jason [email or web address]> I was able to get in touch with the pharmacy, I had to dial 1 and then the number. They said everything should transfer.  Thanks for your help!  On Wed, Sep 28, 2022, 10:19 AM Rohit Deloria [email or web address]> wrote: Morning,  I just called [phone], it is working this morning- if you hold then you will be able to speak with a pharmacy representative! let me know if you have any ongoing issues and I can try and call with you.   thanks! [place], MSW, [email or web address] Work Cell: [phone] Clinical Social Worker II [place] and Vascular Care Navigation   From: [email or web address] [email or web address]> Sent: Tuesday, September 27, 2022 3:51 PM To: Westley Hummer [email or web address]> Subject: Re: Mail Order Pharmacy and Managed Medicaid Line   *Caution - External email - see footer for warnings*  Francee Piccolo,  I tried calling the pharmacy number you gave me  but it said it couldn't complete my call. I would like to go through them but I'm not sure how I would set it up now.  On Tue, Sep 27, 2022, 3:32 PM Bethanee Redondo, Michiel Cowboy [email or web address]> wrote: Hi Mr. [place]penau,  Just wanted to f/u and see where you'd like for [place] to transition your medications to for pharmacy now that you have Medicaid. I have some days off this month and want to make sure we get it sorted for you before that time.  Hope you had a good holiday weekend.  Michiel Cowboy    Patient is participating in a Managed Medicaid Plan:  Yes  [place]: No Food Insecurity (08/24/2022)  Housing: Low Risk  (08/24/2022)  Transportation Needs: No Transportation Needs (08/24/2022)  Utilities: Not At Risk (08/24/2022)  Depression (PHQ2-9):  Medium Risk (12/16/2020)  Financial Resource Strain: Medium Risk (08/24/2022)  Stress: No Stress Concern Present (08/24/2022)  Tobacco Use: Medium Risk (09/06/2022)   Westley Hummer, MSW, [place]  [phone]- work cell phone (preferred) [phone]- desk phone

## 2022-10-06 ENCOUNTER — Other Ambulatory Visit: Payer: Self-pay

## 2022-10-10 ENCOUNTER — Other Ambulatory Visit (HOSPITAL_COMMUNITY): Payer: Self-pay

## 2022-10-14 NOTE — Telephone Encounter (Signed)
H&V Care Navigation CSW Progress Note  Clinical Social Worker  contacted pt back in response  to email on 4/12 regarding how to have Medicaid cover his latest bill. LCSW provided billing number and how to contact them. If he has paid anything towards bill he should ask them about reimbursement.  Patient is participating in a Managed Medicaid Plan:  Yes  SDOH Screenings   Food Insecurity: No Food Insecurity (08/24/2022)  Housing: Low Risk  (08/24/2022)  Transportation Needs: No Transportation Needs (08/24/2022)  Utilities: Not At Risk (08/24/2022)  Depression (PHQ2-9): Medium Risk (12/16/2020)  Financial Resource Strain: Medium Risk (08/24/2022)  Stress: No Stress Concern Present (08/24/2022)  Tobacco Use: Medium Risk (09/06/2022)    Adam Carter, MSW, LCSW Clinical Social Worker II [facility] Heart/Vascular Care Navigation  [phone]- work cell phone (preferred) [phone]- desk phone

## 2022-10-16 ENCOUNTER — Encounter: Payer: Self-pay | Admitting: Cardiovascular Disease

## 2022-10-20 ENCOUNTER — Other Ambulatory Visit: Payer: Self-pay

## 2022-10-20 ENCOUNTER — Other Ambulatory Visit (HOSPITAL_COMMUNITY): Payer: Self-pay

## 2022-10-25 ENCOUNTER — Other Ambulatory Visit (HOSPITAL_COMMUNITY): Payer: Self-pay

## 2022-10-26 ENCOUNTER — Other Ambulatory Visit (HOSPITAL_COMMUNITY): Payer: Self-pay

## 2022-10-26 NOTE — Telephone Encounter (Signed)
He's good to go. No PA required. $4 copay, he fills at [facility] so I pushed it through. They are filling now.

## 2022-12-23 ENCOUNTER — Other Ambulatory Visit (HOSPITAL_COMMUNITY): Payer: Self-pay

## 2022-12-23 ENCOUNTER — Other Ambulatory Visit: Payer: Self-pay

## 2023-03-12 NOTE — Progress Notes (Unsigned)
Cardiology Office Note    Date:  03/15/2023   ID:  Adam Carter, DOB 22-Nov-1975, MRN [id number]  PCP:  Pcp, No  Cardiologist:  Lorine Bears, MD  Electrophysiologist:  None   Chief Complaint: Follow-up  History of Present Illness:   Adam Carter is a 47 y.o. male with history of CAD with out-of-hospital cardiac arrest due to anterior ST elevation MI in 08/2020 status post PCI/DES to the ostial LAD, HFrEF secondary to ICM, HLD, and prior tobacco use who presents for follow-up of his CAD and cardiomyopathy.  He was admitted to the hospital in 08/2020 with an out-of-hospital cardiac arrest due to anterior ST elevation MI.  Emergent LHC showed occlusion of the LAD with no other obstructive disease.  EF was 25 to 30%.  He underwent successful PCI/DES to the ostial LAD.  RHC was not suggestive of cardiogenic shock, and he did not require mechanical support.  Echo showed an EF of 30 to 35%, mild LVH, moderate hypokinesis of the mid apical and anteroseptal wall, anterior wall, and anterolateral wall, normal RV systolic function and ventricular cavity size, and no significant valvular abnormality.  Hospital course was complicated by aspiration pneumonia and mental status changes with ultimate extubation with very good neurologic recovery.  Repeat echo in 09/2020 showed his EF had improved to 40 to 45%, hypokinesis of the mid to apical anterior and anteroseptal wall, grade 2 diastolic dysfunction, normal RV systolic function and ventricular cavity size, and no significant valvular abnormality.  He had atypical chest pain and underwent Lexiscan MPI in 02/2022 which showed evidence of prior anterior infarct without ischemia and a normal EF.  He was last seen in the office in 08/2022 and was without symptoms of angina or cardiac decompensation.  He noted some tenderness in the left breast area with some swelling, which was felt to be secondary to spironolactone, which was discontinued at that time.  He comes in  today and is without symptoms of angina or cardiac decompensation.  Since he was last seen, his father has passed away.  He has been more sedentary and eating foods high in sodium such as pizza and [place] food frequently.  He attributes this to his weight gain.  He has also noted some increase in heartburn with his current diet.  He has not needed any as needed furosemide.  No progressive orthopnea.  No dizziness, presyncope, or syncope.  He continues to abstain from smoking.  He is uncertain if he wants to remain working part-time versus return back to full-time employment.  Left breast swelling/tenderness has improved.   Labs independently reviewed: 02/2022 - Hgb 13.2, PLT 248, potassium 4.2, BUN 7, serum creatinine 0.85, albumin 4.0, AST/ALT normal, direct LDL 48, TC 93, TG 60, HDL 28 08/2020 - A1c 6.0   Past Medical History:  Diagnosis Date   CHF (congestive heart failure) (HCC)    Coronary artery disease    occluded ostial LAD with otherwise no obstructive disease.   PCI/DES to ostial LAD   HFrEF (heart failure with reduced ejection fraction) (HCC)    EF 30-35%   History of cardiac arrest    VF arrest 3/14   History of ST elevation myocardial infarction (STEMI)    09/07/20 anteriolateral STEMI   History of tobacco use    History of ventricular fibrillation    3/14   Hyperlipidemia LDL goal <70    Tobacco abuse 12/03/2016    Past Surgical History:  Procedure Laterality Date   CARDIAC  CATHETERIZATION     CORONARY/GRAFT ACUTE MI REVASCULARIZATION N/A 09/07/2020   Procedure: Coronary/Graft Acute MI Revascularization;  Surgeon: Iran Ouch, MD;  Location: [facility] INVASIVE CV LAB;  Service: Cardiovascular;  Laterality: N/A;   RIGHT/LEFT HEART CATH AND CORONARY ANGIOGRAPHY N/A 09/07/2020   Procedure: RIGHT/LEFT HEART CATH AND CORONARY ANGIOGRAPHY;  Surgeon: Iran Ouch, MD;  Location: [facility] INVASIVE CV LAB;  Service: Cardiovascular;  Laterality: N/A;    Current Medications: Current  Meds  Medication Sig   aspirin EC (ASPIRIN LOW DOSE) 81 MG tablet Take 1 tablet (81 mg total) by mouth daily.   atorvastatin (LIPITOR) 80 MG tablet Take 1 tablet (80 mg total) by mouth daily at 6 PM.   carvedilol (COREG) 3.125 MG tablet Take 1 tablet (3.125 mg total) by mouth 2 (two) times daily with a meal.   clopidogrel (PLAVIX) 75 MG tablet Take 1 tablet (75 mg total) by mouth daily.   sacubitril-valsartan (ENTRESTO) 24-26 MG Take 1 tablet by mouth 2 (two) times daily.    Allergies:   Bacitracin-neomycin-polymyxin and Bupropion   Social History   Socioeconomic History   Marital status: Single    Spouse name: Not on file   Number of children: Not on file   Years of education: Not on file   Highest education level: Not on file  Occupational History   Not on file  Tobacco Use   Smoking status: Former    Current packs/day: 0.00    Types: Cigarettes    Quit date: 09/09/2020    Years since quitting: 2.5   Smokeless tobacco: Never  Vaping Use   Vaping status: Never Used  Substance and Sexual Activity   Alcohol use: Not Currently    Comment: rare   Drug use: Never   Sexual activity: Not on file  Other Topics Concern   Not on file  Social History Narrative   Not on file   Social Determinants of Health   Financial Resource Strain: Medium Risk (08/24/2022)   Overall Financial Resource Strain (CARDIA)    Difficulty of Paying Living Expenses: Somewhat hard  Food Insecurity: No Food Insecurity (08/24/2022)   Hunger Vital Sign    Worried About Running Out of Food in the Last Year: Never true    Ran Out of Food in the Last Year: Never true  Transportation Needs: No Transportation Needs (08/24/2022)   PRAPARE - Transportation    Lack of Transportation (Medical): No    Lack of Transportation (Non-Medical): No  Physical Activity: Not on file  Stress: No Stress Concern Present (08/24/2022)   [employer] of Occupational Health - Occupational Stress Questionnaire    Feeling of  Stress : Only a little  Social Connections: Unknown (10/25/2021)   [facility]   Social Network    Social Network: Not on file     Family History:  The patient's family history includes Diabetes in his father; Heart Problems in his father; Transient ischemic attack in his father.  ROS:   12-point review of systems is negative unless otherwise noted in the HPI.   EKGs/Labs/Other Studies Reviewed:    Studies reviewed were summarized above. The additional studies were reviewed today:  Lexiscan MPI 03/16/2022:   Abnormal, probably low risk pharmacologic myocardial perfusion stress test.   There is a moderate in size, moderate in severity, fixed apical anterior, apical lateral, and apical defect consistent with scar.   There is no evidence of significant ischemia.   There is  subtle apical hypokinesis with otherwise preserved left ventricular systolic function (LVEF 55-65% by visual estimation and Siemens calculation).   Proximal LAD stent is noted. __________  2D echo 10/23/2020: 1. Left ventricular ejection fraction, by estimation, is 40 to 45%. The  left ventricle has mildly decreased function. The left ventricle  demonstrates regional wall motion abnormalities (hypokinesis of the mid to  apical anterior and anteroseptal wall).  Left ventricular diastolic parameters are consistent with Grade II  diastolic dysfunction (pseudonormalization).   2. Right ventricular systolic function is normal. The right ventricular  size is normal.   3. The mitral valve is normal in structure. No evidence of mitral valve  regurgitation. No evidence of mitral stenosis.   Comparison(s): EF 30-35%, moderate hypokinesis LV, mid-apical,  anteroseptal wall, anterior wall, anterolateral wall.  GLS -5.3%. __________   2D echo 09/08/2020: 1. Left ventricular ejection fraction, by estimation, is 30 to 35%. The  left ventricle has moderately decreased function. The left ventricle   demonstrates regional wall motion abnormalities (see scoring  diagram/findings for description). There is mild  left ventricular hypertrophy. Left ventricular diastolic parameters were  normal. There is moderate hypokinesis of the left ventricular, mid-apical  anteroseptal wall, anterior wall and anterolateral wall. The average left  ventricular global longitudinal  strain is -5.3 %. The global longitudinal strain is abnormal.   2. Right ventricular systolic function is normal. The right ventricular  size is normal. Tricuspid regurgitation signal is inadequate for assessing  PA pressure.   3. The mitral valve is normal in structure. No evidence of mitral valve  regurgitation. No evidence of mitral stenosis.   4. The aortic valve is normal in structure. Aortic valve regurgitation is  not visualized. No aortic stenosis is present. __________   [facility] 09/07/2020: The left ventricular ejection fraction is 25-35% by visual estimate. There is moderate to severe left ventricular systolic dysfunction. LV end diastolic pressure is moderately elevated. Ost LAD to Prox LAD lesion is 100% stenosed. Post intervention, there is a 0% residual stenosis. A drug-eluting stent was successfully placed using a STENT RESOLUTE ONYX 3.5X12. Mid LAD lesion is 30% stenosed.   1.  Out of hospital cardiac arrest with anterior ST elevation myocardial infarction due to ostial occlusion of the LAD.  No other obstructive disease. 2.  Moderately severely reduced LV systolic function with an EF of 25 to 30% with severe anterior/apical hypokinesis. 3.  Successful angioplasty and drug-eluting stent placement to the ostial LAD. 4.  Right heart catheterization was performed after PCI and showed mildly elevated filling pressures, mild pulmonary hypertension and normal cardiac output.  PA sat was 78.7%.  Fick cardiac output was 6.17 with a cardiac index of 3.5.  CPO was 1.4.  Based on these hemodynamic numbers, no hemodynamic  support was utilized.   Recommendations: Dual antiplatelet therapy for at least 1 year. High-dose atorvastatin. We will run Aggrastat infusion for 12 hours given uncertainty of oral absorption of antiplatelet medications. The patient had significant agitation and this will need to be managed.  He is currently on propofol drip and fentanyl drip.  I discussed with Dr. Belia Heman. Recommend electrolyte replacement.   EKG:  EKG is ordered today.  The EKG ordered today demonstrates sinus bradycardia, 58 bpm, prior anteroseptal infarct, no acute ST-T changes, consistent with prior tracing  Recent Labs: No results found for requested labs within last 365 days.  Recent Lipid Panel    Component Value Date/Time   CHOL 93 03/08/2022 1053   TRIG  68 03/08/2022 1053   HDL 28 (L) 03/08/2022 1053   CHOLHDL 3.3 03/08/2022 1053   VLDL 14 03/08/2022 1053   LDLCALC 51 03/08/2022 1053   LDLDIRECT 48 03/08/2022 1053    PHYSICAL EXAM:    VS:  BP 98/64 (BP Location: Left Arm, Patient Position: Sitting, Cuff Size: Normal)   Pulse (!) 58   Ht 5\' 7"  (1.702 m)   Wt 191 lb 3.2 oz (86.7 kg)   SpO2 97%   BMI 29.95 kg/m   BMI: Body mass index is 29.95 kg/m.  Physical Exam Vitals reviewed.  Constitutional:      Appearance: He is well-developed.  HENT:     Head: Normocephalic and atraumatic.  Eyes:     General:        Right eye: No discharge.        Left eye: No discharge.  Neck:     Vascular: No JVD.  Cardiovascular:     Rate and Rhythm: Normal rate and regular rhythm.     Heart sounds: Normal heart sounds, S1 normal and S2 normal. Heart sounds not distant. No midsystolic click and no opening snap. No murmur heard.    No friction rub.  Pulmonary:     Effort: Pulmonary effort is normal. No respiratory distress.     Breath sounds: Normal breath sounds. No decreased breath sounds, wheezing or rales.  Chest:     Chest wall: No tenderness.     Comments: No palpable mass along the left  breast. Abdominal:     General: There is no distension.  Musculoskeletal:     Cervical back: Normal range of motion.     Right lower leg: No edema.     Left lower leg: No edema.  Skin:    General: Skin is warm and dry.     Nails: There is no clubbing.  Neurological:     Mental Status: He is alert and oriented to person, place, and time.  Psychiatric:        Speech: Speech normal.        Behavior: Behavior normal.        Thought Content: Thought content normal.        Judgment: Judgment normal.     Wt Readings from Last 3 Encounters:  03/15/23 191 lb 3.2 oz (86.7 kg)  09/06/22 182 lb 2 oz (82.6 kg)  03/08/22 177 lb 12.8 oz (80.6 kg)     ASSESSMENT & PLAN:   CAD involving the native coronary arteries with history of anterior STEMI without angina: He is without symptoms of angina or cardiac decompensation.  Continue long-term DAPT given ostial LAD stent location along with continuation of atorvastatin and carvedilol.  Aggressive risk factor modification and secondary prevention.  No indication for further ischemic testing at this time.  HFrEF secondary to ICM: Euvolemic and well compensated.  Suspect his 9 pound weight gain when compared to his visit in 08/2022 is in the setting of dietary indiscretion.  Remains on carvedilol 3.125 mg twice daily and Entresto 24/26 mg twice daily along with furosemide 20 mg daily as needed (which she has not needed).  Relative hypotension precludes escalation of GDMT.  CHF education.  HLD: LDL 48 in 02/2022.  Check CMP, lipid panel, and direct LDL.  Remains on atorvastatin 80 mg.  Heart healthy diet is recommended.  Left breast swelling: Improved.  Reports a long history of possible nodularity along the left breast.  Declines further evaluation at this time.  Disposition: F/u with Dr. Kirke Corin or an APP in 6 months.   Medication Adjustments/Labs and Tests Ordered: Current medicines are reviewed at length with the patient today.  Concerns regarding  medicines are outlined above. Medication changes, Labs and Tests ordered today are summarized above and listed in the Patient Instructions accessible in Encounters.   Signed, Eula Listen, PA-C 03/15/2023 10:14 AM      [facility] [address] [place], [place] [address] [phone]

## 2023-03-15 ENCOUNTER — Encounter: Payer: Self-pay | Admitting: Physician Assistant

## 2023-03-15 ENCOUNTER — Other Ambulatory Visit
Admission: RE | Admit: 2023-03-15 | Discharge: 2023-03-15 | Disposition: A | Discharge status: Home / Self Care | Payer: Medicaid Other | Source: Ambulatory Visit | Attending: Physician Assistant | Admitting: Physician Assistant

## 2023-03-15 ENCOUNTER — Ambulatory Visit: Payer: Medicaid Other | Attending: Physician Assistant | Admitting: Physician Assistant

## 2023-03-15 VITALS — BP 98/64 | HR 58 | Ht 67.0 in | Wt 191.2 lb

## 2023-03-15 DIAGNOSIS — E785 Hyperlipidemia, unspecified: Secondary | ICD-10-CM | POA: Diagnosis not present

## 2023-03-15 DIAGNOSIS — I2510 Atherosclerotic heart disease of native coronary artery without angina pectoris: Secondary | ICD-10-CM

## 2023-03-15 DIAGNOSIS — N63 Unspecified lump in unspecified breast: Secondary | ICD-10-CM

## 2023-03-15 DIAGNOSIS — I5022 Chronic systolic (congestive) heart failure: Secondary | ICD-10-CM | POA: Diagnosis not present

## 2023-03-15 DIAGNOSIS — I255 Ischemic cardiomyopathy: Secondary | ICD-10-CM

## 2023-03-15 LAB — COMPREHENSIVE METABOLIC PANEL WITH GFR
ALT: 21 U/L (ref 0–44)
AST: 18 U/L (ref 15–41)
Albumin: 3.7 g/dL (ref 3.5–5.0)
Alkaline Phosphatase: 93 U/L (ref 38–126)
Anion gap: 11 (ref 5–15)
BUN: 9 mg/dL (ref 6–20)
CO2: 24 mmol/L (ref 22–32)
Calcium: 8.8 mg/dL — ABNORMAL LOW (ref 8.9–10.3)
Chloride: 104 mmol/L (ref 98–111)
Creatinine, Ser: 0.83 mg/dL (ref 0.61–1.24)
GFR, Estimated: >60 mL/min (ref >60)
Glucose, Bld: 111 mg/dL — ABNORMAL HIGH (ref 70–99)
Potassium: 3.6 mmol/L (ref 3.5–5.1)
Sodium: 139 mmol/L (ref 135–145)
Total Bilirubin: 1.3 mg/dL — ABNORMAL HIGH (ref 0.3–1.2)
Total Protein: 7 g/dL (ref 6.5–8.1)

## 2023-03-15 LAB — LIPID PANEL
Cholesterol: 101 mg/dL (ref 0–200)
HDL: 32 mg/dL — ABNORMAL LOW (ref >40)
LDL Cholesterol: 47 mg/dL (ref 0–99)
Total CHOL/HDL Ratio: 3.2 ratio
Triglycerides: 110 mg/dL (ref <150)
VLDL: 22 mg/dL (ref 0–40)

## 2023-03-15 LAB — CBC
HCT: 41.2 % (ref 39.0–52.0)
Hemoglobin: 13.8 g/dL (ref 13.0–17.0)
MCH: 30.5 pg (ref 26.0–34.0)
MCHC: 33.5 g/dL (ref 30.0–36.0)
MCV: 91.2 fL (ref 80.0–100.0)
Platelets: 262 10*3/uL (ref 150–400)
RBC: 4.52 MIL/uL (ref 4.22–5.81)
RDW: 12.6 % (ref 11.5–15.5)
WBC: 8.2 10*3/uL (ref 4.0–10.5)
nRBC: 0 % (ref 0.0–0.2)

## 2023-03-15 LAB — LDL CHOLESTEROL, DIRECT: Direct LDL: 54 mg/dL (ref 0–99)

## 2023-03-15 NOTE — Patient Instructions (Signed)
Medication Instructions:  Your Physician recommend you continue on your current medication as directed.    *If you need a refill on your cardiac medications before your next appointment, please call your pharmacy*  Lab Work: Your provider would like for you to have following labs drawn today CMT, Lipid Panel, Direct LDL, and CBC.   If you have labs (blood work) drawn today and your tests are completely normal, you will receive your results only by: MyChart Message (if you have MyChart) OR A paper copy in the mail If you have any lab test that is abnormal or we need to change your treatment, we will call you to review the results.  Follow-Up: At [facility], you and your health needs are our priority.  As part of our continuing mission to provide you with exceptional heart care, we have created designated Provider Care Teams.  These Care Teams include your primary Cardiologist (physician) and Advanced Practice Providers (APPs -  Physician Assistants and Nurse Practitioners) who all work together to provide you with the care you need, when you need it.  We recommend signing up for the patient portal called "MyChart".  Sign up information is provided on this After Visit Summary.  MyChart is used to connect with patients for Virtual Visits (Telemedicine).  Patients are able to view lab/test results, encounter notes, upcoming appointments, etc.  Non-urgent messages can be sent to your provider as well.   To learn more about what you can do with MyChart, go to [email or web address].    Your next appointment:   6 month(s)  Provider:   You may see Lorine Bears, MD or one of the following Advanced Practice Providers on your designated Care Team:   Eula Listen, [place]

## 2023-03-22 ENCOUNTER — Other Ambulatory Visit: Payer: Self-pay

## 2023-06-16 ENCOUNTER — Other Ambulatory Visit: Payer: Self-pay | Admitting: Cardiovascular Disease

## 2023-06-19 ENCOUNTER — Other Ambulatory Visit (HOSPITAL_COMMUNITY): Payer: Self-pay

## 2023-06-19 ENCOUNTER — Other Ambulatory Visit: Payer: Self-pay

## 2023-06-19 MED ORDER — ATORVASTATIN CALCIUM 80 MG PO TABS
80.0000 mg | ORAL_TABLET | Freq: Every day | ORAL | 1 refills | Status: DC
  Filled 2023-06-19: qty 90, 90d supply, fill #0
  Filled 2023-09-14: qty 90, 90d supply, fill #1

## 2023-06-19 MED ORDER — ENTRESTO 24-26 MG PO TABS
1.0000 | ORAL_TABLET | Freq: Two times a day (BID) | ORAL | 2 refills | Status: DC
  Filled 2023-06-19: qty 180, 90d supply, fill #0
  Filled 2023-09-14 (×3): qty 180, 90d supply, fill #1
  Filled 2023-12-12: qty 180, 90d supply, fill #2

## 2023-06-19 MED ORDER — CLOPIDOGREL BISULFATE 75 MG PO TABS
75.0000 mg | ORAL_TABLET | Freq: Every day | ORAL | 1 refills | Status: DC
  Filled 2023-06-19: qty 90, 90d supply, fill #0
  Filled 2023-09-14: qty 90, 90d supply, fill #1

## 2023-06-19 MED ORDER — CARVEDILOL 3.125 MG PO TABS
3.1250 mg | ORAL_TABLET | Freq: Two times a day (BID) | ORAL | 1 refills | Status: DC
  Filled 2023-06-19: qty 180, 90d supply, fill #0
  Filled 2023-09-14: qty 180, 90d supply, fill #1

## 2023-08-31 ENCOUNTER — Telehealth: Payer: Self-pay | Admitting: Cardiovascular Disease

## 2023-08-31 NOTE — Telephone Encounter (Signed)
Patient came by office Dropped off patient assistance forms to be completed Placed in nurse box

## 2023-09-04 ENCOUNTER — Telehealth: Payer: Self-pay

## 2023-09-04 NOTE — Telephone Encounter (Signed)
 All information faxed over to [employer] pool.

## 2023-09-04 NOTE — Telephone Encounter (Signed)
 PAP: Application for Sherryll Burger has been submitted to [employer], via fax If patient requests an update in the meantime, please refer them to [employer] at [phone]

## 2023-09-12 ENCOUNTER — Ambulatory Visit: Payer: Medicaid Other | Attending: Cardiovascular Disease | Admitting: Cardiovascular Disease

## 2023-09-12 ENCOUNTER — Encounter: Payer: Self-pay | Admitting: Cardiovascular Disease

## 2023-09-12 VITALS — BP 104/68 | HR 61 | Ht 67.0 in | Wt 195.2 lb

## 2023-09-12 DIAGNOSIS — E785 Hyperlipidemia, unspecified: Secondary | ICD-10-CM | POA: Diagnosis not present

## 2023-09-12 DIAGNOSIS — I2510 Atherosclerotic heart disease of native coronary artery without angina pectoris: Secondary | ICD-10-CM

## 2023-09-12 DIAGNOSIS — I5022 Chronic systolic (congestive) heart failure: Secondary | ICD-10-CM | POA: Diagnosis not present

## 2023-09-12 DIAGNOSIS — Z87891 Personal history of nicotine dependence: Secondary | ICD-10-CM | POA: Diagnosis not present

## 2023-09-12 NOTE — Patient Instructions (Signed)
 Medication Instructions:  No changes *If you need a refill on your cardiac medications before your next appointment, please call your pharmacy*   Lab Work: None ordered If you have labs (blood work) drawn today and your tests are completely normal, you will receive your results only by: MyChart Message (if you have MyChart) OR A paper copy in the mail If you have any lab test that is abnormal or we need to change your treatment, we will call you to review the results.   Testing/Procedures: None ordered   Follow-Up: At [facility], you and your health needs are our priority.  As part of our continuing mission to provide you with exceptional heart care, we have created designated Provider Care Teams.  These Care Teams include your primary Cardiologist (physician) and Advanced Practice Providers (APPs -  Physician Assistants and Nurse Practitioners) who all work together to provide you with the care you need, when you need it.  We recommend signing up for the patient portal called "MyChart".  Sign up information is provided on this After Visit Summary.  MyChart is used to connect with patients for Virtual Visits (Telemedicine).  Patients are able to view lab/test results, encounter notes, upcoming appointments, etc.  Non-urgent messages can be sent to your provider as well.   To learn more about what you can do with MyChart, go to [email or web address].    Your next appointment:   6 month(s)  Provider:   Dr. Kirke Corin

## 2023-09-12 NOTE — Progress Notes (Unsigned)
 Cardiology Office Note   Date:  09/12/2023   ID:  Adam Carter, DOB 1975-12-11, MRN [id number]  PCP:  Patient, No Pcp Per  Cardiologist:  Lorine Bears, MD   Chief Complaint  Patient presents with   Follow-up    6 month follow up visit. Patient states that he hasn't been eating very much. Patient states that he had a dizziness spell December and January. Patient states that his dream is always dry.  Meds reviewed.        History of Present Illness: Adam Carter is a 48 y.o. male who presents for a follow-up visit regarding coronary artery disease.  The patient has history of prior tobacco use.  He presented in March of this year with out of hospital cardiac arrest due to anterior ST elevation myocardial infarction.  Emergent cardiac catheterization was performed which showed ostial occlusion of the LAD with no other obstructive disease.  Ejection fraction was 25 to 30%.  I performed successful angioplasty and drug-eluting stent placement to the ostial LAD.  Right heart catheterization was not suggestive of cardiogenic shock and he did not require mechanical support.  Hospital course was complicated by aspiration pneumonia and mental status changes but ultimately he was extubated with very good neurologic recovery.  He had a repeat echocardiogram done in April of 2022 which showed an ejection fraction of 40 to 45%. He quit smoking since his cardiac event.    He had significant anxiety and depression after his cardiac event.  He currently works part-time. He had atypical chest pain last year and underwent a Lexiscan Myoview in September which showed evidence of prior anterior infarct without ischemia and normal ejection fraction.  No recurrent chest pain or shortness of breath.  He does complain of tenderness in the left breast area with some swelling.  Past Medical History:  Diagnosis Date   CHF (congestive heart failure) (HCC)    Coronary artery disease    occluded ostial LAD with  otherwise no obstructive disease.   PCI/DES to ostial LAD   HFrEF (heart failure with reduced ejection fraction) (HCC)    EF 30-35%   History of cardiac arrest    VF arrest 3/14   History of ST elevation myocardial infarction (STEMI)    09/07/20 anteriolateral STEMI   History of tobacco use    History of ventricular fibrillation    3/14   Hyperlipidemia LDL goal <70    Tobacco abuse 12/03/2016    Past Surgical History:  Procedure Laterality Date   CARDIAC CATHETERIZATION     CORONARY/GRAFT ACUTE MI REVASCULARIZATION N/A 09/07/2020   Procedure: Coronary/Graft Acute MI Revascularization;  Surgeon: Iran Ouch, MD;  Location: [facility] INVASIVE CV [facility];  Service: Cardiovascular;  Laterality: N/A;   RIGHT/LEFT HEART CATH AND CORONARY ANGIOGRAPHY N/A 09/07/2020   Procedure: RIGHT/LEFT HEART CATH AND CORONARY ANGIOGRAPHY;  Surgeon: Iran Ouch, MD;  Location: [facility] INV[facility] CV [facility];  Service: Cardiovascular;  Laterality: N/A;     Current Outpatient Medications  Medication Sig Dispense Refill   aspirin EC (ASPIRIN LOW DOSE) 81 MG tablet Take 1 tablet (81 mg total) by mouth daily. 90 tablet 2   atorvastatin (LIPITOR) 80 MG tablet Take 1 tablet (80 mg total) by mouth daily at 6 PM. 90 tablet 1   carvedilol (COREG) 3.125 MG tablet Take 1 tablet (3.125 mg total) by mouth 2 (two) times daily with a meal. 180 tablet 1   clopidogrel (PLAVIX) 75 MG tablet Take 1 tablet (75  mg total) by mouth daily. 90 tablet 1   sacubitril-valsartan (ENTRESTO) 24-26 MG Take 1 tablet by mouth 2 (two) times daily. 180 tablet 2   No current facility-administered medications for this visit.    Allergies:   Bacitracin-neomycin-polymyxin and Bupropion    Social History:  The patient  reports that he quit smoking about 3 years ago. His smoking use included cigarettes. He has never used smokeless tobacco. He reports that he does not currently use alcohol. He reports that he does not use drugs.   Family History:  The  patient's family history includes Diabetes in his father; Heart Problems in his father; Transient ischemic attack in his father.    ROS:  Please see the history of present illness.   Otherwise, review of systems are positive for none.   All other systems are reviewed and negative.    PHYSICAL EXAM: VS:  BP 104/68   Pulse 61   Ht 5\' 7"  (1.702 m)   Wt 195 lb 3.2 oz (88.5 kg)   SpO2 98%   BMI 30.57 kg/m  , BMI Body mass index is 30.57 kg/m. GEN: Well nourished, well developed, in no acute distress  HEENT: normal  Neck: no JVD, carotid bruits, or masses Cardiac: RRR; no murmurs, rubs, or gallops,no edema  Respiratory:  clear to auscultation bilaterally, normal work of breathing GI: soft, nontender, nondistended, + BS MS: no deformity or atrophy  Skin: warm and dry, no rash Neuro:  Strength and sensation are intact Psych: euthymic mood, full affect   EKG:  EKG is ordered today. EKG showed: Normal sinus rhythm Low voltage QRS Cannot rule out Anteroseptal infarct (cited on or before 12-Sep-2023) When compared with ECG of 15-Mar-2023 08:21, No significant change was found    Recent Labs: 03/15/2023: ALT 21; BUN 9; Creatinine, Ser 0.83; Hemoglobin 13.8; Platelets 262; Potassium 3.6; Sodium 139    Lipid Panel    Component Value Date/Time   CHOL 101 03/15/2023 0943   TRIG 110 03/15/2023 0943   HDL 32 (L) 03/15/2023 0943   CHOLHDL 3.2 03/15/2023 0943   VLDL 22 03/15/2023 0943   LDLCALC 47 03/15/2023 0943   LDLDIRECT 54 03/15/2023 0943      Wt Readings from Last 3 Encounters:  09/12/23 195 lb 3.2 oz (88.5 kg)  03/15/23 191 lb 3.2 oz (86.7 kg)  09/06/22 182 lb 2 oz (82.6 kg)          No data to display            ASSESSMENT AND PLAN:  1.  Coronary artery disease involving native coronary arteries without angina: He is doing very well with no anginal symptoms.  Continue dual antiplatelet therapy long-term as tolerated given  ostial LAD stent location.  2.   Chronic systolic heart failure due to ischemic cardiomyopathy: He appears to be euvolemic without any diuretics.   Continue carvedilol and Entresto  3.  Hyperlipidemia: Continue high-dose atorvastatin.  I reviewed most recent lipid profile done in September which showed an LDL 51.  4.  Previous tobacco use: Quit smoking after his myocardial infarction.  5.  Left breast tenderness and swelling: Likely due to spironolactone.  I discontinued the medication today.  I instructed him to notify us in 1 month if there is no improvement of this.  If this does not resolve, he will require further imaging.    Disposition:   FU with me in 6 months  Signed,  Lorine Bears, MD  09/12/2023 4:38  PM    [facility]

## 2023-09-14 ENCOUNTER — Other Ambulatory Visit (HOSPITAL_COMMUNITY): Payer: Self-pay

## 2023-09-14 ENCOUNTER — Other Ambulatory Visit: Payer: Self-pay

## 2023-09-14 ENCOUNTER — Encounter (HOSPITAL_COMMUNITY): Payer: Self-pay

## 2023-09-18 ENCOUNTER — Other Ambulatory Visit (HOSPITAL_COMMUNITY): Payer: Self-pay

## 2023-10-02 NOTE — Telephone Encounter (Signed)
 PAP: Patient assistance application for Sherryll Burger has been approved by PAP Companies: [employer] from 09/06/23 to 06/26/24. Medication should be delivered to [facility] Delivery: Home. For further shipping updates, please contact [employer] at [phone]. Patient ID is: [id number]

## 2023-12-12 ENCOUNTER — Other Ambulatory Visit: Payer: Self-pay | Admitting: Cardiovascular Disease

## 2023-12-12 ENCOUNTER — Other Ambulatory Visit: Payer: Self-pay

## 2023-12-13 ENCOUNTER — Other Ambulatory Visit (HOSPITAL_COMMUNITY): Payer: Self-pay

## 2023-12-13 ENCOUNTER — Other Ambulatory Visit: Payer: Self-pay

## 2023-12-13 MED ORDER — CARVEDILOL 3.125 MG PO TABS
3.1250 mg | ORAL_TABLET | Freq: Two times a day (BID) | ORAL | 1 refills | Status: DC
  Filled 2023-12-13: qty 180, 90d supply, fill #0
  Filled 2024-03-11: qty 180, 90d supply, fill #1

## 2023-12-13 MED ORDER — ATORVASTATIN CALCIUM 80 MG PO TABS
80.0000 mg | ORAL_TABLET | Freq: Every day | ORAL | 1 refills | Status: DC
  Filled 2023-12-13: qty 90, 90d supply, fill #0
  Filled 2024-03-11: qty 90, 90d supply, fill #1

## 2023-12-15 ENCOUNTER — Other Ambulatory Visit: Payer: Self-pay | Admitting: Cardiovascular Disease

## 2023-12-19 ENCOUNTER — Other Ambulatory Visit: Payer: Self-pay

## 2023-12-19 MED ORDER — CLOPIDOGREL BISULFATE 75 MG PO TABS
75.0000 mg | ORAL_TABLET | Freq: Every day | ORAL | 3 refills | Status: AC
  Filled 2023-12-19: qty 90, 90d supply, fill #0
  Filled 2024-03-11: qty 90, 90d supply, fill #1
  Filled 2024-07-03: qty 90, 90d supply, fill #2

## 2024-03-11 ENCOUNTER — Other Ambulatory Visit (HOSPITAL_COMMUNITY): Payer: Self-pay

## 2024-03-11 ENCOUNTER — Other Ambulatory Visit: Payer: Self-pay

## 2024-03-11 ENCOUNTER — Other Ambulatory Visit: Payer: Self-pay | Admitting: Cardiovascular Disease

## 2024-03-12 ENCOUNTER — Other Ambulatory Visit (HOSPITAL_COMMUNITY): Payer: Self-pay

## 2024-03-12 ENCOUNTER — Other Ambulatory Visit: Payer: Self-pay

## 2024-03-12 MED ORDER — ASPIRIN 81 MG PO TBEC
81.0000 mg | DELAYED_RELEASE_TABLET | Freq: Every day | ORAL | 1 refills | Status: AC
  Filled 2024-03-12: qty 90, 90d supply, fill #0
  Filled 2024-07-03: qty 90, 90d supply, fill #1

## 2024-07-03 ENCOUNTER — Other Ambulatory Visit: Payer: Self-pay | Admitting: Cardiovascular Disease

## 2024-07-03 MED ORDER — SACUBITRIL-VALSARTAN 24-26 MG PO TABS
1.0000 | ORAL_TABLET | Freq: Two times a day (BID) | ORAL | 0 refills | Status: AC
  Filled 2024-07-03: qty 180, 90d supply, fill #0

## 2024-07-04 ENCOUNTER — Other Ambulatory Visit: Payer: Self-pay

## 2024-07-04 MED ORDER — CARVEDILOL 3.125 MG PO TABS
3.1250 mg | ORAL_TABLET | Freq: Two times a day (BID) | ORAL | 1 refills | Status: AC
  Filled 2024-07-04: qty 180, 90d supply, fill #0

## 2024-07-04 MED ORDER — ATORVASTATIN CALCIUM 80 MG PO TABS
80.0000 mg | ORAL_TABLET | Freq: Every day | ORAL | 1 refills | Status: AC
  Filled 2024-07-04: qty 90, 90d supply, fill #0
